# Patient Record
Sex: Female | Born: 1960 | Race: White | Hispanic: No | Marital: Married | Smoking: Former smoker
Health system: Southern US, Community
[De-identification: ages and names within clinical notes are randomized; demographics above are authoritative.]

## PROBLEM LIST (undated history)

## (undated) DIAGNOSIS — K589 Irritable bowel syndrome without diarrhea: Secondary | ICD-10-CM

## (undated) DIAGNOSIS — K219 Gastro-esophageal reflux disease without esophagitis: Secondary | ICD-10-CM

## (undated) DIAGNOSIS — R0602 Shortness of breath: Secondary | ICD-10-CM

## (undated) DIAGNOSIS — J45909 Unspecified asthma, uncomplicated: Secondary | ICD-10-CM

## (undated) DIAGNOSIS — IMO0001 Reserved for inherently not codable concepts without codable children: Secondary | ICD-10-CM

## (undated) DIAGNOSIS — M1990 Unspecified osteoarthritis, unspecified site: Secondary | ICD-10-CM

## (undated) DIAGNOSIS — R7301 Impaired fasting glucose: Secondary | ICD-10-CM

## (undated) DIAGNOSIS — G4730 Sleep apnea, unspecified: Secondary | ICD-10-CM

## (undated) DIAGNOSIS — R7303 Prediabetes: Secondary | ICD-10-CM

## (undated) DIAGNOSIS — Q249 Congenital malformation of heart, unspecified: Secondary | ICD-10-CM

## (undated) DIAGNOSIS — T7840XA Allergy, unspecified, initial encounter: Secondary | ICD-10-CM

## (undated) DIAGNOSIS — E785 Hyperlipidemia, unspecified: Secondary | ICD-10-CM

## (undated) DIAGNOSIS — E039 Hypothyroidism, unspecified: Secondary | ICD-10-CM

## (undated) DIAGNOSIS — K222 Esophageal obstruction: Secondary | ICD-10-CM

## (undated) DIAGNOSIS — E669 Obesity, unspecified: Secondary | ICD-10-CM

## (undated) DIAGNOSIS — Z950 Presence of cardiac pacemaker: Secondary | ICD-10-CM

## (undated) DIAGNOSIS — G4733 Obstructive sleep apnea (adult) (pediatric): Secondary | ICD-10-CM

## (undated) DIAGNOSIS — G43909 Migraine, unspecified, not intractable, without status migrainosus: Secondary | ICD-10-CM

## (undated) DIAGNOSIS — F329 Major depressive disorder, single episode, unspecified: Secondary | ICD-10-CM

## (undated) DIAGNOSIS — J439 Emphysema, unspecified: Secondary | ICD-10-CM

## (undated) DIAGNOSIS — H269 Unspecified cataract: Secondary | ICD-10-CM

## (undated) DIAGNOSIS — F419 Anxiety disorder, unspecified: Secondary | ICD-10-CM

## (undated) DIAGNOSIS — R001 Bradycardia, unspecified: Secondary | ICD-10-CM

## (undated) DIAGNOSIS — F32A Depression, unspecified: Secondary | ICD-10-CM

## (undated) HISTORY — DX: Impaired fasting glucose: R73.01

## (undated) HISTORY — DX: Gastro-esophageal reflux disease without esophagitis: K21.9

## (undated) HISTORY — DX: Irritable bowel syndrome, unspecified: K58.9

## (undated) HISTORY — DX: Unspecified asthma, uncomplicated: J45.909

## (undated) HISTORY — DX: Prediabetes: R73.03

## (undated) HISTORY — DX: Emphysema, unspecified: J43.9

## (undated) HISTORY — PX: OTHER SURGICAL HISTORY: SHX169

## (undated) HISTORY — DX: Hypothyroidism, unspecified: E03.9

## (undated) HISTORY — DX: Sleep apnea, unspecified: G47.30

## (undated) HISTORY — PX: REPLACEMENT TOTAL KNEE: SUR1224

## (undated) HISTORY — DX: Migraine, unspecified, not intractable, without status migrainosus: G43.909

## (undated) HISTORY — DX: Obstructive sleep apnea (adult) (pediatric): G47.33

## (undated) HISTORY — PX: CHOLECYSTECTOMY: SHX55

## (undated) HISTORY — PX: PACEMAKER INSERTION: SHX728

## (undated) HISTORY — DX: Bradycardia, unspecified: R00.1

## (undated) HISTORY — DX: Allergy, unspecified, initial encounter: T78.40XA

## (undated) HISTORY — DX: Presence of cardiac pacemaker: Z95.0

## (undated) HISTORY — DX: Depression, unspecified: F32.A

## (undated) HISTORY — DX: Obesity, unspecified: E66.9

## (undated) HISTORY — PX: CATARACT EXTRACTION: SUR2

## (undated) HISTORY — DX: Reserved for inherently not codable concepts without codable children: IMO0001

## (undated) HISTORY — DX: Esophageal obstruction: K22.2

## (undated) HISTORY — DX: Congenital malformation of heart, unspecified: Q24.9

## (undated) HISTORY — DX: Hyperlipidemia, unspecified: E78.5

## (undated) HISTORY — DX: Unspecified osteoarthritis, unspecified site: M19.90

## (undated) HISTORY — DX: Anxiety disorder, unspecified: F41.9

## (undated) HISTORY — PX: VAGINAL HYSTERECTOMY: SUR661

## (undated) HISTORY — PX: BREAST CYST EXCISION: SHX579

## (undated) HISTORY — DX: Major depressive disorder, single episode, unspecified: F32.9

## (undated) HISTORY — PX: HERNIA REPAIR: SHX51

## (undated) HISTORY — DX: Unspecified cataract: H26.9

## (undated) HISTORY — PX: TUBAL LIGATION: SHX77

## (undated) HISTORY — PX: COLONOSCOPY: SHX174

## (undated) HISTORY — PX: FOOT SURGERY: SHX648

## (undated) HISTORY — DX: Shortness of breath: R06.02

## (undated) HISTORY — PX: ABDOMINAL HERNIA REPAIR: SHX539

---

## 2001-01-31 ENCOUNTER — Other Ambulatory Visit: Admission: RE | Admit: 2001-01-31 | Discharge: 2001-01-31 | Payer: Self-pay | Admitting: Obstetrics and Gynecology

## 2001-04-18 ENCOUNTER — Inpatient Hospital Stay (HOSPITAL_COMMUNITY): Admission: EM | Admit: 2001-04-18 | Discharge: 2001-04-19 | Payer: Self-pay

## 2001-05-03 ENCOUNTER — Ambulatory Visit (HOSPITAL_COMMUNITY): Admission: RE | Admit: 2001-05-03 | Discharge: 2001-05-04 | Payer: Self-pay | Admitting: General Surgery

## 2001-05-03 ENCOUNTER — Encounter (INDEPENDENT_AMBULATORY_CARE_PROVIDER_SITE_OTHER): Payer: Self-pay | Admitting: Specialist

## 2001-05-29 ENCOUNTER — Ambulatory Visit (HOSPITAL_COMMUNITY): Admission: RE | Admit: 2001-05-29 | Discharge: 2001-05-29 | Payer: Self-pay | Admitting: Gastroenterology

## 2001-05-29 ENCOUNTER — Encounter: Payer: Self-pay | Admitting: Gastroenterology

## 2003-06-28 ENCOUNTER — Encounter: Payer: Self-pay | Admitting: Emergency Medicine

## 2003-06-28 ENCOUNTER — Inpatient Hospital Stay (HOSPITAL_COMMUNITY): Admission: EM | Admit: 2003-06-28 | Discharge: 2003-06-29 | Payer: Self-pay | Admitting: Emergency Medicine

## 2003-07-11 ENCOUNTER — Ambulatory Visit (HOSPITAL_COMMUNITY): Admission: RE | Admit: 2003-07-11 | Discharge: 2003-07-11 | Payer: Self-pay | Admitting: Family Medicine

## 2003-08-22 ENCOUNTER — Ambulatory Visit (HOSPITAL_COMMUNITY): Admission: RE | Admit: 2003-08-22 | Discharge: 2003-08-22 | Payer: Self-pay | Admitting: Family Medicine

## 2003-08-22 ENCOUNTER — Encounter: Payer: Self-pay | Admitting: Family Medicine

## 2004-09-13 ENCOUNTER — Inpatient Hospital Stay (HOSPITAL_COMMUNITY): Admission: RE | Admit: 2004-09-13 | Discharge: 2004-09-17 | Payer: Self-pay | Admitting: Psychiatry

## 2004-09-14 ENCOUNTER — Ambulatory Visit: Payer: Self-pay | Admitting: Psychiatry

## 2005-01-04 ENCOUNTER — Ambulatory Visit (HOSPITAL_COMMUNITY): Admission: RE | Admit: 2005-01-04 | Discharge: 2005-01-04 | Payer: Self-pay | Admitting: Family Medicine

## 2005-05-10 ENCOUNTER — Ambulatory Visit: Payer: Self-pay | Admitting: Psychiatry

## 2005-05-10 ENCOUNTER — Inpatient Hospital Stay (HOSPITAL_COMMUNITY): Admission: AD | Admit: 2005-05-10 | Discharge: 2005-05-16 | Payer: Self-pay | Admitting: Psychiatry

## 2005-08-05 ENCOUNTER — Ambulatory Visit (HOSPITAL_COMMUNITY): Admission: RE | Admit: 2005-08-05 | Discharge: 2005-08-05 | Payer: Self-pay | Admitting: Family Medicine

## 2005-08-23 ENCOUNTER — Ambulatory Visit (HOSPITAL_COMMUNITY): Admission: RE | Admit: 2005-08-23 | Discharge: 2005-08-23 | Payer: Self-pay | Admitting: Family Medicine

## 2005-11-23 ENCOUNTER — Encounter: Payer: Self-pay | Admitting: Obstetrics & Gynecology

## 2005-11-23 ENCOUNTER — Inpatient Hospital Stay (HOSPITAL_COMMUNITY): Admission: RE | Admit: 2005-11-23 | Discharge: 2005-11-25 | Payer: Self-pay | Admitting: Obstetrics & Gynecology

## 2006-08-07 ENCOUNTER — Ambulatory Visit (HOSPITAL_COMMUNITY): Admission: RE | Admit: 2006-08-07 | Discharge: 2006-08-07 | Payer: Self-pay | Admitting: Family Medicine

## 2006-09-06 ENCOUNTER — Ambulatory Visit (HOSPITAL_COMMUNITY): Admission: RE | Admit: 2006-09-06 | Discharge: 2006-09-06 | Payer: Self-pay | Admitting: Family Medicine

## 2007-04-25 ENCOUNTER — Ambulatory Visit (HOSPITAL_COMMUNITY): Admission: RE | Admit: 2007-04-25 | Discharge: 2007-04-25 | Payer: Self-pay | Admitting: Family Medicine

## 2007-07-04 ENCOUNTER — Ambulatory Visit (HOSPITAL_COMMUNITY): Admission: RE | Admit: 2007-07-04 | Discharge: 2007-07-04 | Payer: Self-pay | Admitting: Family Medicine

## 2008-03-15 ENCOUNTER — Emergency Department (HOSPITAL_COMMUNITY): Admission: EM | Admit: 2008-03-15 | Discharge: 2008-03-16 | Payer: Self-pay | Admitting: Emergency Medicine

## 2009-03-19 ENCOUNTER — Ambulatory Visit (HOSPITAL_COMMUNITY): Admission: RE | Admit: 2009-03-19 | Discharge: 2009-03-19 | Payer: Self-pay | Admitting: Family Medicine

## 2010-03-24 ENCOUNTER — Encounter: Payer: Self-pay | Admitting: Orthopedic Surgery

## 2010-03-24 ENCOUNTER — Ambulatory Visit (HOSPITAL_COMMUNITY): Admission: RE | Admit: 2010-03-24 | Discharge: 2010-03-24 | Payer: Self-pay | Admitting: Family Medicine

## 2010-03-30 ENCOUNTER — Ambulatory Visit (HOSPITAL_COMMUNITY): Admission: RE | Admit: 2010-03-30 | Discharge: 2010-03-30 | Payer: Self-pay | Admitting: Family Medicine

## 2010-04-09 ENCOUNTER — Encounter: Payer: Self-pay | Admitting: Orthopedic Surgery

## 2010-04-12 ENCOUNTER — Ambulatory Visit: Payer: Self-pay | Admitting: Orthopedic Surgery

## 2010-04-12 DIAGNOSIS — M25819 Other specified joint disorders, unspecified shoulder: Secondary | ICD-10-CM | POA: Insufficient documentation

## 2010-04-12 DIAGNOSIS — M7580 Other shoulder lesions, unspecified shoulder: Secondary | ICD-10-CM

## 2010-04-26 ENCOUNTER — Encounter: Payer: Self-pay | Admitting: Orthopedic Surgery

## 2010-04-26 ENCOUNTER — Encounter: Admission: RE | Admit: 2010-04-26 | Discharge: 2010-07-25 | Payer: Self-pay | Admitting: Orthopedic Surgery

## 2010-09-14 ENCOUNTER — Encounter: Payer: Self-pay | Admitting: Adult Health

## 2010-09-14 ENCOUNTER — Ambulatory Visit: Payer: Self-pay | Admitting: Cardiology

## 2010-09-14 DIAGNOSIS — R55 Syncope and collapse: Secondary | ICD-10-CM | POA: Insufficient documentation

## 2010-09-14 DIAGNOSIS — G4733 Obstructive sleep apnea (adult) (pediatric): Secondary | ICD-10-CM | POA: Insufficient documentation

## 2010-09-14 DIAGNOSIS — F17200 Nicotine dependence, unspecified, uncomplicated: Secondary | ICD-10-CM | POA: Insufficient documentation

## 2010-09-22 ENCOUNTER — Ambulatory Visit: Payer: Self-pay | Admitting: Cardiology

## 2010-09-24 ENCOUNTER — Encounter: Payer: Self-pay | Admitting: Cardiology

## 2010-09-24 ENCOUNTER — Ambulatory Visit: Payer: Self-pay | Admitting: Cardiology

## 2010-09-24 ENCOUNTER — Ambulatory Visit (HOSPITAL_COMMUNITY): Admission: RE | Admit: 2010-09-24 | Discharge: 2010-09-24 | Payer: Self-pay | Admitting: Cardiology

## 2010-09-27 ENCOUNTER — Encounter: Payer: Self-pay | Admitting: Cardiology

## 2010-09-29 ENCOUNTER — Encounter (INDEPENDENT_AMBULATORY_CARE_PROVIDER_SITE_OTHER): Payer: Self-pay | Admitting: *Deleted

## 2010-10-26 ENCOUNTER — Encounter: Payer: Self-pay | Admitting: Cardiology

## 2010-10-28 ENCOUNTER — Ambulatory Visit: Payer: Self-pay | Admitting: Cardiology

## 2010-10-28 DIAGNOSIS — I452 Bifascicular block: Secondary | ICD-10-CM | POA: Insufficient documentation

## 2010-10-28 DIAGNOSIS — I442 Atrioventricular block, complete: Secondary | ICD-10-CM | POA: Insufficient documentation

## 2010-10-29 ENCOUNTER — Encounter: Payer: Self-pay | Admitting: Cardiology

## 2010-11-09 ENCOUNTER — Ambulatory Visit: Payer: Self-pay | Admitting: Cardiovascular Disease

## 2010-11-09 ENCOUNTER — Inpatient Hospital Stay (HOSPITAL_COMMUNITY): Admission: EM | Admit: 2010-11-09 | Discharge: 2010-11-11 | Payer: Self-pay | Admitting: Emergency Medicine

## 2010-11-17 ENCOUNTER — Encounter: Payer: Self-pay | Admitting: Internal Medicine

## 2010-11-26 ENCOUNTER — Ambulatory Visit: Payer: Self-pay | Admitting: Cardiology

## 2010-11-26 ENCOUNTER — Encounter: Payer: Self-pay | Admitting: Internal Medicine

## 2011-01-11 NOTE — Letter (Signed)
Summary: Appointment - Missed  [facility] [place]  [address], [place] [address]   Phone: [phone]  Fax: [phone]     September 29, 2010 MRN: [id number]   [name] [address] [place], [place]  [address]   Dear Ms. KRAWCZYK,  Our records indicate you missed your appointment on      09/29/10                with Dr.       .     MCDOWELL                               It is very important that we reach you to reschedule this appointment. We look forward to participating in your health care needs. Please contact us at the number listed above at your earliest convenience to reschedule this appointment.     Sincerely,    Neurosurgeon Team  Appended Document: Appointment - Missed appt scheduled for next week

## 2011-01-11 NOTE — Letter (Signed)
Summary: [place] Results Engineer, agricultural at [facility]  [address] [place] [address]   Phone: [phone]  Fax: [phone]      September 24, 2010 MRN: [id number]   [name] [address] [place], [place]  [address]   Dear [name],  Your test ordered by Selena Batten has been reviewed by your physician (or physician assistant) and was found to be normal or stable. Your physician (or physician assistant) felt no changes were needed at this time.  __X__ Echocardiogram  __X__ Cardiac Stress Test (Stress Echo)  ____ Lab Work  ____ Peripheral vascular study of arms, legs or neck  ____ CT scan or X-ray  ____ Lung or Breathing test  ____ Other:   Thank you.  Please continue on current medical treatment.  Nona Dell, MD, F.A.C.C

## 2011-01-11 NOTE — Procedures (Signed)
Summary: [facility]  [facility]   Imported By: Faythe Ghee 10/26/2010 09:18:19  _____________________________________________________________________  External Attachment:    Type:   Image     Comment:   External Document

## 2011-01-11 NOTE — Letter (Signed)
Summary: Stress Echocardiogram Information Sheet  [facility]  [address] [place] [address]   Phone: [phone]  Fax: [phone]      September 14, 2010 MRN: [id number] light prior to the test.   [name]  Doctor: Appointment Date: Appointment Time: Appointment Location: [facility]  Stress Echocardiogram Information Sheet    Instructions:   1. You may take your morning medications the morning of test.  2. Do not eat or drink after midnight the night prior to test.   3. Dress prepared to exercise.  4. DO NOT use ANY caffine or tobacco products 3 hours before appointment.  5. Report to the Short Stay Center on the1st floor.  6. Please bring all current prescription medications.  7. If you have any questions, please call [phone]
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
## 2011-01-11 NOTE — Miscellaneous (Signed)
Summary: PT initial summary  PT initial summary   Imported By: Jacklynn Ganong 05/06/2010 10:52:37  _____________________________________________________________________  External Attachment:    Type:   Image     Comment:   External Document

## 2011-01-11 NOTE — Procedures (Signed)
Summary: [facility]   Imported By: Faythe Ghee 10/29/2010 10:00:39  _____________________________________________________________________  External Attachment:    Type:   Image     Comment:   External Document

## 2011-01-11 NOTE — Letter (Signed)
Summary: [place] Results Engineer, agricultural at [facility]  [address], [place] [address]   Phone: [phone]  Fax: [phone]      September 27, 2010 MRN: [id number]   [name] [address] [place], [place]  [address]   Dear [name],  Your test ordered by Selena Batten has been reviewed by your physician (or physician assistant) and was found to be normal or stable. Your physician (or physician assistant) felt no changes were needed at this time.  __X__ Echocardiogram  ____ Cardiac Stress Test  ____ Lab Work  ____ Peripheral vascular study of arms, legs or neck  ____ CT scan or X-ray  ____ Lung or Breathing test  ____ Other:  Please continue on current medical treatment.  Thank you.   Nona Dell, MD, F.A.C.C

## 2011-01-11 NOTE — Miscellaneous (Signed)
Summary: Device preload  Clinical Lists Changes  Observations: Added new observation of PPM INDICATN: Syncope (11/17/2010 8:30) Added new observation of MAGNET RTE: BOL 100 ERI 85 (11/17/2010 8:30) Added new observation of PPMLEADSTAT2: active (11/17/2010 8:30) Added new observation of PPMLEADSER2: [id number] (11/17/2010 8:30) Added new observation of PPMLEADMOD2: 2088TC (11/17/2010 8:30) Added new observation of PPMLEADDOI2: 11/10/2010 (11/17/2010 8:30) Added new observation of PPMLEADLOC2: RV (11/17/2010 8:30) Added new observation of PPMLEADSTAT1: active (11/17/2010 8:30) Added new observation of PPMLEADSER1: [id number] (11/17/2010 8:30) Added new observation of PPMLEADMOD1: 2088TC (11/17/2010 8:30) Added new observation of PPMLEADDOI1: 11/10/2010 (11/17/2010 8:30) Added new observation of PPMLEADLOC1: RA (11/17/2010 8:30) Added new observation of PPM IMP MD: Lewayne Bunting, MD (11/17/2010 8:30) Added new observation of PPM DOI: 11/10/2010 (11/17/2010 8:30) Added new observation of PPM SERL#: [id number]  (11/17/2010 8:30) Added new observation of PPM MODL#: [id number]  (11/17/2010 7:84) Added new observation of PACEMAKERMFG: [employer] Jude  (11/17/2010 8:30) Added new observation of PACEMAKER MD: Lewayne Bunting, MD  (11/17/2010 8:30)      PPM Specifications Following MD:  Lewayne Bunting, MD     PPM Vendor:  [employer] Jude     PPM Model Number:  [id number]     PPM Serial Number:  [id number] PPM DOI:  11/10/2010     PPM Implanting MD:  Lewayne Bunting, MD  Lead 1    Location: RA     DOI: 11/10/2010     Model #: [id number]     Serial #: [id number]     Status: active Lead 2    Location: RV     DOI: 11/10/2010     Model #: [id number]     Serial #: [id number]     Status: active  Magnet Response Rate:  BOL 100 ERI 85  Indications:  Syncope

## 2011-01-11 NOTE — Miscellaneous (Signed)
Summary: physical therapy order  physical therapy order   Imported By: Cammie Sickle 07/01/2010 19:07:30  _____________________________________________________________________  External Attachment:    Type:   Image     Comment:   External Document

## 2011-01-11 NOTE — Assessment & Plan Note (Signed)
Summary: LT SHOULDER PAIN/?TEAR/MRI AP 03/24/10/NEED XR/REF WSLUKING/CI...   Vital Signs:  Patient profile:   50 year old female Height:      67 inches Weight:      186 pounds Pulse rate:   70 / minute Resp:     16 per minute  Vitals Entered By: Fuller Canada MD (Apr 12, 2010 11:14 AM)  Visit Type:  new patient Referring Provider:  Lubertha South Primary Provider:  Dr. Lubertha South  CC:  left shoulder pain.  History of Present Illness: 50 year old female presents with 6 months of pain in her LEFT shoulder with no history of injury  Pain is sharp and dull, she rates at a 9/10, it is intermittent, if she moves her arm in the wrong way she gets a sharp pain.  It came on gradually.  She did see improvement if she does not use the arm it is worse with movement and use and is associated with catching, she denies numbness or stiffness.  She does not get relief with ibuprofen 400 mg. Xrays today in our office.  MRI [facility] for review from 03/24/10. the MRI shows a mildly displaced SLAP tear by report tray subdeltoid bursitis supraspinatus tendinitis and tendinopathy in the biceps tendon.  Meds: Simvastatin, Levothyroxine, Vitamin D, Nexium, Ibuprofen 400mg  no relief.    Allergies (verified): 1)  ! Codeine 2)  ! Sulfa 3)  ! Betadine  Past History:  Past Medical History: IBS htn thyroid reflux sleep apnea depression anxiety cholesterol  Past Surgical History: 2 c sections tubal ligation stomach hernia hysterectomy gallbladder screws in rt foot  Family History: FH of Cancer:  Family History of Diabetes Family History Coronary Heart Disease female < 12 Family History of Arthritis Hx, family, asthma  Social History: Patient is married.  unemployed smokes 1 ppd cigs occasional 10 cans of soda per day  Review of Systems Constitutional:  Complains of weight gain and fatigue; denies weight loss, fever, and chills. Cardiovascular:  Complains of fainting and murmurs;  denies chest pain and palpitations. Respiratory:  Complains of snoring; denies short of breath, wheezing, couch, tightness, pain on inspiration, and snoring . Gastrointestinal:  Complains of heartburn, diarrhea, and constipation; denies nausea, vomiting, and blood in your stools. Genitourinary:  Denies frequency, urgency, difficulty urinating, painful urination, flank pain, and bleeding in urine. Neurologic:  Denies numbness, tingling, unsteady gait, dizziness, tremors, and seizure. Musculoskeletal:  Denies joint pain, swelling, instability, stiffness, redness, heat, and muscle pain. Endocrine:  Denies excessive thirst, exessive urination, and heat or cold intolerance. Psychiatric:  Complains of nervousness, depression, and anxiety; denies hallucinations. Skin:  Denies changes in the skin, poor healing, rash, itching, and redness. HEENT:  Denies blurred or double vision, eye pain, redness, and watering. Immunology:  Complains of seasonal allergies; denies sinus problems and allergic to bee stings. Hemoatologic:  Denies easy bleeding and brusing.  Physical Exam  Additional Exam:   * VS reviewed and were normal   *GEN: appearance was normal   ** CDV: normal pulses temperature and no edema  * LYMPH nodes were normal   * SKIN was normal   * Neuro: normal sensation ** Psyche: AAO x 3 and mood was normal   MSK *Gait was normal   The LEFT shoulder is tender over the anterolateral acromion, there is no swelling, the shoulder is stable, the SubScap is 5/5, the EXT/ROT are 5/5, the SSpinatus is 4/5. The impingement sign is positive. ROM: EXT/ROT=  45  INT/ROT=  level lumbar 3           FLEXION=   150    there is no instability in the LEFT shoulder    the RIGHT shoulder seemed to have full range of motion no tenderness or swelling normal strength in the internal and external rotators and no impingement signs and appears to be stable      Impression & Recommendations:  Problem #  1:  IMPINGEMENT SYNDROME (ICD-726.2) Assessment New  plain films were also obtained and they were negative.  I think this SLAP tear diagnosis is questionable.  He seems to be more impingement.  There is some tendinopathy that I do see and the long head of the biceps but nonoperative treatment is probably the best option at this point.  Orders: New Patient Level III ([address]) Depo- Medrol 40mg  (J1030) Joint Aspirate / Injection, Large (20610) Shoulder x-ray,  minimum 2 views (60454)  Problem # 2:     Patient Instructions: 1)  PT 2)  You have received an injection of cortisone today. You may experience increased pain at the injection site. Apply ice pack to the area for 20 minutes every 2 hours and take 2 xtra strength tylenol every 8 hours. This increased pain will usually resolve in 24 hours. The injection will take effect in 3-10 days.  3)  Please schedule a follow-up appointment in 2 months. 4)  Try tylenol 1000 mg by mouth q 6 hrs as needed for pain

## 2011-01-11 NOTE — Assessment & Plan Note (Signed)
Summary: rov/rm   Visit Type:  Follow-up Referring Provider:  Lubertha South Primary Provider:  Dr. Simone Curia  CC:  no cardiology complaints.  History of Present Illness: Mrs. Acoff returns today for discussion of cardionet monitor results in the setting of weakness, near syncope, and palpatations.She has a history of hypothyroidism with normal TSH on last check at 4.2, hypercholesterolemia with last eval in Sept of 2011.  She has had some repeated episodes of profound fatigue, requiring her to lie down, and some dizziness at times, no further episodes of symcope. No chest pain.  Current Medications (verified): 1)  Vitamin D3 1000 Unit Tabs (Cholecalciferol) .... Take 1 Tab Daily 2)  Prilosec 20 Mg Cpdr (Omeprazole) .... Take 1 Tab Daily 3)  Levothroid 50 Mcg Tabs (Levothyroxine Sodium) .... Take 1 Tab Daily 4)  Pravastatin Sodium 40 Mg Tabs (Pravastatin Sodium) .... Take 1 Tab Daily 5)  Vitamin B-12 1000 Mcg Tabs (Cyanocobalamin) .... Take 1 Tab Daily  Allergies (verified): 1)  ! Codeine 2)  ! Sulfa 3)  ! Betadine  Comments:  Nurse/Medical Assistant: patient reviewed med list from previous ov and stated all meds are the same  Past History:  Past medical, surgical, family and social histories (including risk factors) reviewed, and no changes noted (except as noted below).  Past Medical History: Reviewed history from 09/14/2010 and no changes required. IBS Anxiety Depression - bipolar disorder G E R D Hyperlipidemia Hypertension Hypothyroidism OSA  Past Surgical History: Reviewed history from 09/14/2010 and no changes required. Cesarean sections x 2 Tubal ligation Hernia repair Hysterectomy Cholecystectomy Screws in right foot  Family History: Reviewed history from 09/14/2010 and no changes required. FH of Cancer Family History of Diabetes Family History Coronary Heart Disease female < 81 Family History of Arthritis Hx, family, asthma  Social  History: Reviewed history from 09/14/2010 and no changes required. Unemployed 10 cans of soda per day Alcohol Use - yes (occasional) Tobacco Use - Yes (one PPD) Married   Review of Systems       Profound weakness and near syncope.  Vital Signs:  Patient profile:   50 year old female Weight:      175 pounds BMI:     27.51 Pulse rate:   73 / minute BP sitting:   117 / 79  (right arm)  Vitals Entered By: Dreama Saa, CNA (October 28, 2010 1:30 PM)  Physical Exam  General:  Well developed, well nourished, in no acute distress. Lungs:  Clear bilaterally to auscultation and percussion. Heart:  Non-displaced PMI, chest non-tender; regular rate and rhythm, S1, S2 without murmurs, rubs or gallops. Carotid upstroke normal, no bruit. Normal abdominal aortic size, no bruits. Femorals normal pulses, no bruits. Pedals normal pulses. No edema, no varicosities. Msk:  Back normal, normal gait. Muscle strength and tone normal. Pulses:  pulses normal in all 4 extremities Extremities:  No clubbing or cyanosis. Neurologic:  Alert and oriented x 3. Psych:  Normal affect.   Impression & Recommendations:  Problem # 1:  AV BLOCK, COMPLETE (ICD-426.0) Review of cardionet completed from 09/22/2010 to 10/12/2010 and read by Dr. Diona Browner demonstrates NSR with HR variablility.  Single pause of 3.5 seconds on 10/06/2010, AV Block. No medications are taken that are affecting her HR.  Recent TSH was WNL.  She is symptomatic with profound fatigue causing her to lie down.  Previous to wearing monitor she was having near syncope and syncope.  I have advised patient that she will be referred to  EP in [place] for evaluation for PMK, vs other testing per them.  Copy of cardionet is provided to patient and will be sent to EP referral.  Problem # 2:  FAMILY HISTORY CORONARY HEART DISEASE FEMALE < 55 (ICD-V17.3) She has had recent stress test and echo on Sep 24, 2010 which demonstrated normal LVEF at 55%-60% and  negative ischemia or wall motion abnormalities at stress.  Low risk for CAD at present.  Problem # 3:  TOBACCO ABUSE (ICD-305.1) Cessation is recommended.  Patient Instructions: 1)  You have been referred to ElectroPhysiology Doctor

## 2011-01-13 NOTE — Cardiovascular Report (Signed)
Summary: Office Visit   Office Visit   Imported By: Roderic Ovens 12/10/2010 16:20:20  _____________________________________________________________________  External Attachment:    Type:   Image     Comment:   External Document

## 2011-01-13 NOTE — Procedures (Signed)
Summary: wound check.sjm.amber   Current Medications (verified): 1)  Vitamin D3 1000 Unit Tabs (Cholecalciferol) .... Take 1 Tab Daily 2)  Prilosec 20 Mg Cpdr (Omeprazole) .... Take 1 Tab Daily 3)  Levothroid 50 Mcg Tabs (Levothyroxine Sodium) .... Take 1 Tab Daily 4)  Pravastatin Sodium 40 Mg Tabs (Pravastatin Sodium) .... Take 1 Tab Daily 5)  Vitamin B-12 1000 Mcg Tabs (Cyanocobalamin) .... Take 1 Tab Daily  Allergies (verified): 1)  ! Codeine 2)  ! Sulfa 3)  ! Betadine  PPM Specifications Following MD:  Lewayne Bunting, MD     PPM Vendor:  [employer] Jude     PPM Model Number:  [id number]     PPM Serial Number:  [id number] PPM DOI:  11/10/2010     PPM Implanting MD:  Lewayne Bunting, MD  Lead 1    Location: RA     DOI: 11/10/2010     Model #: [id number]     Serial #: [id number]     Status: active Lead 2    Location: RV     DOI: 11/10/2010     Model #: [id number]     Serial #: [id number]     Status: active  Magnet Response Rate:  BOL 100 ERI 85  Indications:  Syncope CHB   PPM Follow Up Remote Check?  No Battery Voltage:  3.20 V     Battery Est. Longevity:  12 years     Pacer Dependent:  No       PPM Device Measurements Atrium  Amplitude: 1.9 mV, Impedance: 410 ohms, Threshold: 0.875 V at 0.4 msec Right Ventricle  Amplitude: 7.9 mV, Impedance: 540 ohms, Threshold: 0.75 V at 0.4 msec  Episodes MS Episodes:  0     Percent Mode Switch:  0     Coumadin:  No Atrial Pacing:  <1%     Ventricular Pacing:  <1%  Parameters Mode:  DDD     Lower Rate Limit:  50     Upper Rate Limit:  100 Paced AV Delay:  350     Sensed AV Delay:  325 Next Cardiology Appt Due:  02/14/2011 Tech Comments:  Steri strips removed, no redness or edema.  Ms. Whittier is c/o a fair amount of continued soreness @ the site.  She does have a history of shoulder problems and again the site looks fine.  She has limited movement in that shoulder due to previous problems.  She was instructed to call the office if the pain becomes worse or if the  site becomes red, swollen or hot to touch or if she developes chills or a fever, otherwise Dr. Ladona Ridgel will see her 02/14/11 in [facility].  No parameter changes.  Device function normal. Altha Harm, LPN  November 26, 2010 9:12 AM  .sign

## 2011-01-14 NOTE — Letter (Signed)
Summary: History form  History form   Imported By: Jacklynn Ganong 04/21/2010 15:44:20  _____________________________________________________________________  External Attachment:    Type:   Image     Comment:   External Document

## 2011-02-14 ENCOUNTER — Encounter: Payer: Self-pay | Admitting: Internal Medicine

## 2011-02-14 ENCOUNTER — Encounter (INDEPENDENT_AMBULATORY_CARE_PROVIDER_SITE_OTHER): Payer: Self-pay | Admitting: *Deleted

## 2011-02-14 ENCOUNTER — Encounter (INDEPENDENT_AMBULATORY_CARE_PROVIDER_SITE_OTHER): Payer: Commercial Indemnity | Admitting: Internal Medicine

## 2011-02-14 DIAGNOSIS — Z950 Presence of cardiac pacemaker: Secondary | ICD-10-CM | POA: Insufficient documentation

## 2011-02-14 DIAGNOSIS — I495 Sick sinus syndrome: Secondary | ICD-10-CM

## 2011-02-14 DIAGNOSIS — I10 Essential (primary) hypertension: Secondary | ICD-10-CM

## 2011-02-21 ENCOUNTER — Encounter: Payer: Self-pay | Admitting: Internal Medicine

## 2011-02-22 LAB — POCT I-STAT, CHEM 8
BUN: 4 mg/dL — ABNORMAL LOW (ref 6–23)
Chloride: 107 mEq/L (ref 96–112)
Creatinine, Ser: 0.9 mg/dL (ref 0.4–1.2)
Potassium: 3.6 mEq/L (ref 3.5–5.1)

## 2011-02-22 LAB — APTT: aPTT: 30 seconds (ref 24–37)

## 2011-02-22 LAB — TSH: TSH: 1.271 u[IU]/mL (ref 0.350–4.500)

## 2011-02-22 LAB — PROTIME-INR: Prothrombin Time: 13.6 seconds (ref 11.6–15.2)

## 2011-02-22 NOTE — Assessment & Plan Note (Signed)
Summary: pacer check.sjm.amber/hm   Visit Type:  Follow-up Referring Provider:  Lubertha South Primary Provider:  Dr. Simone Curia  CC:  no cardiology complaints.  History of Present Illness: Mrs. Saylor returns today for followup. She is a pleasant middle aged woman with a h/o syncope and documented bradycardia who underwent PPM several months ago. She has had no recurrent syncopal spells and feels well. Her only complaint today is that she would like to stop smoking. No c/p, sob or peripheral edema. No pain around her PPM site.  Current Medications (verified): 1)  Vitamin D3 1000 Unit Tabs (Cholecalciferol) .... Take 1 Tab Daily 2)  Prilosec 20 Mg Cpdr (Omeprazole) .... Take 1 Tab Daily 3)  Levothroid 50 Mcg Tabs (Levothyroxine Sodium) .... Take 1 Tab Daily 4)  Pravastatin Sodium 40 Mg Tabs (Pravastatin Sodium) .... Take 1 Tab Daily 5)  Vitamin B-12 1000 Mcg Tabs (Cyanocobalamin) .... Take 1 Tab Daily  Allergies (verified): 1)  ! Codeine 2)  ! Sulfa 3)  ! Betadine  Comments:  Nurse/Medical Assistant: no meds no list we reviewed from previous ov stated all meds are correct  Past History:  Past Medical History: Last updated: 09/14/2010 IBS Anxiety Depression - bipolar disorder G E R D Hyperlipidemia Hypertension Hypothyroidism OSA  Past Surgical History: Last updated: 09/14/2010 Cesarean sections x 2 Tubal ligation Hernia repair Hysterectomy Cholecystectomy Screws in right foot  Review of Systems  The patient denies chest pain, syncope, dyspnea on exertion, and peripheral edema.    Vital Signs:  Patient profile:   50 year old female Weight:      174 pounds BMI:     27.35 Pulse rate:   56 / minute BP sitting:   125 / 81  (left arm)  Vitals Entered By: Dreama Saa, CNA (February 14, 2011 8:30 AM)  Physical Exam  General:  Well developed, well nourished, in no acute distress. Head:  normocephalic and atraumatic Eyes:  PERRLA/EOM intact; conjunctiva and  lids normal. Neck:  Neck supple, no JVD. No masses, thyromegaly or abnormal cervical nodes. Chest Wall:  Well healed PPM incision. Lungs:  Clear bilaterally to auscultation with no wheezes, rales,or rhonchi. Heart:  RRR with normal S1 and S2. PMI is not enlarged. Abdomen:  Bowel sounds positive; abdomen soft and non-tender without masses, organomegaly, or hernias noted. No hepatosplenomegaly. Msk:  Back normal, normal gait. Muscle strength and tone normal. Pulses:  pulses normal in all 4 extremities Extremities:  No clubbing or cyanosis. Neurologic:  Alert and oriented x 3.   PPM Specifications Following MD:  Lewayne Bunting, MD     PPM Vendor:  [employer]     PPM Model Number:  [id number]     PPM Serial Number:  [id number] PPM DOI:  11/10/2010     PPM Implanting MD:  Lewayne Bunting, MD  Lead 1    Location: RA     DOI: 11/10/2010     Model #: [id number]     Serial #: [id number]     Status: active Lead 2    Location: RV     DOI: 11/10/2010     Model #: [id number]     Serial #: [id number]     Status: active  Magnet Response Rate:  BOL 100 ERI 85  Indications:  Syncope CHB   PPM Follow Up Pacer Dependent:  No      Episodes Coumadin:  No  Parameters Mode:  DDD     Lower Rate Limit:  50  Upper Rate Limit:  100 Paced AV Delay:  350     Sensed AV Delay:  325  Impression & Recommendations:  Problem # 1:  CARDIAC PACEMAKER IN SITU (ICD-V45.01) Her device is working normally. Will check in several months.  Problem # 2:  TOBACCO ABUSE (ICD-305.1) I have discussed the importance of smoking cessation. She will try to stop.  Patient Instructions: 1)  Your physician recommends that you schedule a follow-up appointment in: November, 2012

## 2011-02-22 NOTE — Letter (Signed)
Summary: Clearance Letter  [facility]  [address], [place] [address]   Phone: [phone]  Fax: [phone]    February 14, 2011  Re:     [name] Address:   [address]     [place], [place]  [address] DOB:     [birth year] MRN:     [id number]   To Whom It May Concern:   [name] had syncope secondary to bradycardia.  She required a   pacemaker insertion for this  condition.  Syncope has since resolved due   to pacemaker placement.           Sincerely,  Dr. Lewayne Bunting, MD, [facility]

## 2011-04-29 NOTE — Discharge Summary (Signed)
[name], [name]                ACCOUNT NO.:  [id number]   MEDICAL RECORD NO.:  [id number]          PATIENT TYPE:  INP   LOCATION:  A418                          FACILITY:  [facility]   PHYSICIAN:  Lazaro Arms, M.D.   DATE OF BIRTH:  Dec 29, 1960   DATE OF ADMISSION:  11/23/2005  DATE OF DISCHARGE:  12/15/2006LH                                 DISCHARGE SUMMARY   DISCHARGE DIAGNOSES:  1.  Status post total abdominal hysterectomy, bilateral salpingo-      oophorectomy.  2.  Unremarkable postoperative course.   PROCEDURES:  Total abdominal hysterectomy, bilateral salpingo-oophorectomy.   HISTORY OF PRESENT ILLNESS:  Please refer to the History and Physical on the  Operative Report for details of admission to the hospital.   HOSPITAL COURSE:  The patient was admitted postoperatively from [facility]  because of enlarged fibroid uterus which was becoming increasingly  symptomatic, both with pain and with heavy bleeding.  Her postoperative  course was unremarkable.  She tolerated clear liquids and a regular diet.  She voided without symptoms and was extensively ambulatory.  Her incision  remained clean, dry and intact.  She was voiding well and had progression of  normal bowel function, and again, tolerated oral pain medications.  She was  discharged to home on the morning of postoperative day #2 in good, stable  condition.  Follow up the following week in the office to have her incision  examined and staples removed.  She was given Lorcet Plus for pain at the  time of discharge, and given instruction precautions for return to the  office prior to her scheduled visit if needed.      Lazaro Arms, M.D.  Electronically Signed     LHE/MEDQ  D:  12/28/2005  T:  12/28/2005  Job:  [id number]

## 2011-04-29 NOTE — Discharge Summary (Signed)
NAMEJENNAVIEVE, [name]                            ACCOUNT NO.:  [id number]   MEDICAL RECORD NO.:  [id number]                   PATIENT TYPE:  INP   LOCATION:  A201                                 FACILITY:  [facility]   PHYSICIAN:  Donna Bernard, M.D.             DATE OF BIRTH:  Nov 05, 1961   DATE OF ADMISSION:  06/28/2003  DATE OF DISCHARGE:  06/29/2003                                 DISCHARGE SUMMARY   FINAL DIAGNOSES:  1. Syncopal event.  2. Sinusitis.  3. History of depression/anxiety.  4. Insomnia.   DISPOSITION:  1. Patient discharged home.  2. Follow up with Dr. Donna Bernard on Tuesday as scheduled.  3. Report any further stones.   DISCHARGE MEDICATIONS:  1. Astelin 2 sprays each nostril twice per day.  2. Augmentin 875 b.i.d. for 10 days.  3. Maintain Lexapro 10 mg daily.  4. Add Xanax 0.5 q.h.s. p.r.n. for sleep.   INITIAL OBSERVATION NOTE:  See as dictated.   HOSPITAL COURSE:  This patient is a 50 year old white female with chronic  smoking history who presented to the emergency room on the day of admission  with syncopal event. She was at dinner, out to eat, sitting at the table  with her family when she experienced some tingling sensation around her  face.  This lasted for a few moments and then the patient put her hand over  her face, and apparently collapsed forward with her face falling down into  her plate of foot.  She was only out for a few seconds.  When she came back  around, she could recall no palpitations.  She recalled no tingling around  her hands or __________.  The patient gives no prior history of significant  syncopal event.  She has been on Lexapro, for some time, for anxiety and  depression.  The patient notes no increased anxiety recently. She also noted  that she had not been particularly stressed recently and this does not seem  to have anything to do with her unusual sensations.  These spells usually  occurs interestingly in the evening  time when she is eating.  The patient  has not had any significant reflux symptoms.   There is no family history of premature heart disease.   We went ahead and admitted her to the hospital.  She was monitored via  telemetry.  She did have a bit of bradycardia, but no other arrhythmias.  We  did cardiac enzymes x2, these were negative.  The patient's blood  pressure was more or less within normal limits.  She had no further spells.  The slight tightness in her chest resolved. A repeat EKG was stable. On the  morning of discharge the patient was feeling fine. She was eager to go home.  At this point I feel that she was stable for discharge.  We need to press on  with  further workup.                                               Donna Bernard, M.D.    [name]  D:  06/29/2003  T:  06/29/2003  Job:  [id number]

## 2011-04-29 NOTE — Procedures (Signed)
NAMEMALERIE, EAKINS                          ACCOUNT NO.:  [id number]   MEDICAL RECORD NO.:  [id number]                   PATIENT TYPE:  INP   LOCATION:  A201                                 FACILITY:  [facility]   PHYSICIAN:  Donna Bernard, M.D.             DATE OF BIRTH:  1961/03/01   DATE OF PROCEDURE:  06/29/2003  DATE OF DISCHARGE:  06/29/2003                                EKG INTERPRETATION   FINDINGS:  EKG reveals sinus bradycardia with nonspecific ST segment changes  consistent with early repolarization.      ___________________________________________                                            Donna Bernard, M.D.   WSL/MEDQ  D:  12/10/2003  T:  12/10/2003  Job:  [id number]

## 2011-04-29 NOTE — Procedures (Signed)
   [name]                          ACCOUNT NO.:  [id number]   MEDICAL RECORD NO.:  [id number]                   PATIENT TYPE:  OUT   LOCATION:  DFTL                                 FACILITY:  [facility]   PHYSICIAN:  Donna Bernard, M.D.             DATE OF BIRTH:  June 20, 1961   DATE OF PROCEDURE:  07/11/2003  DATE OF DISCHARGE:  07/11/2003                                    STRESS TEST   INDICATION FOR TEST:  Chest pain along with multiple risk factors.   Stress test was performed at standard Bruce protocol with resting EKG  revealing normal sinus rhythm, no significant ST-T changes.  The patient  tolerated the first two stages well.  During the third stage the patient  developed progressive shortness of breath.  She reached the sub max  predicted heart rate of 161 at a minute-and-a-half into the third stage.  At  this point, using a strict definition of assessing the ST segment at 0.08  seconds past the J point, the patient did have depression in leads II, III,  aVF, and in V5 and V6.  However, the ST segment was nicely ascending in  these areas.  Of note, the final EKG obtained before the stress test was  stopped had a significant amount of artifact and threw some of the computer  assessment off.  Visual reading of the EKG at the current heart rate of 156  at the moment of stopping the test showed that 0.08 seconds past the J point  there was no significant ST segment depression.  The depression was mostly 1  mm or less with a very sharply ascending slope.  The patient's ST segments  quickly returned to normal during the rest phase.   IMPRESSION:  Negative adequate stress test.   PLAN:  The patient encouraged to press on with follow-up in the office and  exercise.                                               Donna Bernard, M.D.    WSL/MEDQ  D:  08/27/2003  T:  08/27/2003  Job:  [id number]

## 2011-04-29 NOTE — H&P (Signed)
[place]. [facility]  Patient:    [name], [name]                         MRN: [id number] Adm. Date:  05/03/01 Attending:  Jimmye Norman, M.D. CC:         Loran Senters, M.D.             Dr. Manson Passey                         History and Physical  DATE OF BIRTH:  1961-01-11  IDENTIFICATION AND CHIEF COMPLAINT:  The patient is a 50 year old seen in the emergency room recently with choledocholithiasis and symptomatic gallstones.  HISTORY OF PRESENT ILLNESS:  She was seen in the emergency room for symptomatic gallstones which resolved by the time I was in to see her.  She had normal liver function tests and a normal white blood cell count. She was allowed to go home prior to scheduling surgery.  Ultrasound did not demonstrate any evidence of common duct obstruction.  There was perhaps by report some thickening of the wall but the report read diffuse gallbladder wall thickening. However, in hopes of calming down her cholecystitis she was placed on antibiotics and sent home.  PAST MEDICAL HISTORY:  Remarkable for acid reflux for which she takes Aciphex 20 mg twice a day.  PAST SURGICAL HISTORY:  She has had cesarean sections times two and a tubal ligation. Also a ventral hernia and cyst removed from her wrist.  FAMILY HISTORY:  Her father is alive and has throat cancer.  Mother is alive and brothers and sisters are alive and doing well.  SOCIAL HISTORY:  She is a 1-1/2 to 2 pack per day smoker.  She does not drink alcohol.  ALLERGIES:  CODEINE, BETADINE, and VIOXX.  PHYSICAL EXAMINATION:  GENERAL:  Well-nourished woman in no acute distress.  She is anicteric. Her symptoms have completely.  ABDOMEN:  Soft and nontender without any palpable masses.  CARDIAC:  Regular rhythm and rate with no murmurs, gallops, lifts or heaves. Her heart rate is slow in the 60s.  LUNGS:  Clear.  RECTAL/PELVIC:  Deferred.  LABORATORY DATA:  She has no abnormal liver  function tests.  Her white count is normal.  Hemoglobin 15.8, hematocrit 45.  Her liver function tests were all normal.  Only abnormality high platelet count of 435 and a glucose of 120.  IMPRESSION:  Symptomatic cholelithiasis and perhaps subacute cholecystitis in an otherwise healthy 50 year old woman.  PLAN:  To go ahead and do a laparoscopic cholecystectomy.  Intraoperative cholangiogram will not be necessary with the normal liver function tests unless clinically indicated at the time of surgery.  She will get preoperative antibiotics and PAS hose on call to the operating room. DD:  05/02/01 TD:  05/03/01 Job: [id number]

## 2011-04-29 NOTE — H&P (Signed)
[name], [name]                ACCOUNT NO.:  [id number]   MEDICAL RECORD NO.:  [id number]          PATIENT TYPE:  IPS   LOCATION:  0301                          FACILITY:  [facility]   PHYSICIAN:  Jeanice Lim, M.D. DATE OF BIRTH:  21-May-1961   DATE OF ADMISSION:  05/10/2005  DATE OF DISCHARGE:                         PSYCHIATRIC ADMISSION ASSESSMENT   IDENTIFYING INFORMATION:  A 50 year old married white female   INCOMPLETE DICTATION      JO/MEDQ  D:  05/13/2005  T:  05/13/2005  Job:  [id number]

## 2011-04-29 NOTE — Discharge Summary (Signed)
[place]. [facility]  Patient:    [name], [name]                         MRN: [id number] Adm. Date:  [id number] Disc. Date: [id number] Attending:  Arsenio Loader Dictator:   Pricilla Holm, M.D. CC:         Dr. Julieta Gutting, [place]  Jimmye Norman, M.D.   Discharge Summary  CONSULTATIONS:  Dr. Lindie Spruce, general surgery.  DISCHARGE DIAGNOSES: 1. Acute cholecystitis. 2. Esophageal spasm/stricture, rule out myocardial infarction.  DISCHARGE MEDICATIONS: 1. Aciphex one tablet p.o. q.d. 2. Maalox as needed. 3. Cipro 500 mg one tablet p.o. b.i.d. x 7 days. 4. Dilaudid 2 mg one tablet p.o. q.4-6h. p.r.n. pain.  PROCEDURE:  Right upper quadrant ultrasound which revealed gallbladder wall thickening, multiple stones and common bile duct mildly dilated at 6-7 mm consistent with acute cholecystitis.  HISTORY OF PRESENT ILLNESS:  [name] is a 50 year old, white female with past medical history of esophageal stricture who presents with pressure at her xiphoid which started about 10 p.m. the night before admission.  The pain was constant and there were no insighting factors or trauma.  Pain radiates below the xiphoid bilaterally at level of diaphragm.  She has positive nausea, vomiting and flushing.  Nothing made it better.  The patient did receive some relief with sublingual nitroglycerin in the emergency department.  She has never had anything like this before.  She denied any heart problems.  She believed this to be her esophagus.  She is reporting increased dysphagia with eating and drinking her esophagus was stretched two years ago.  She does have a history of GERD and has seen Dr. Arlyce Dice in the past.  PAST MEDICAL HISTORY: 1. GERD. 2. Esophageal stricture, status post stretching two years ago. 3. Contact dermatitis. 4. IBD. 5. Tobacco abuse.  PAST SURGICAL HISTORY: 1. C-section x 2. 2. Ventral hernia. 3. BTL.  MEDICATION:  Aciphex.  ALLERGIES:   VIOXX, BETADINE, CODEINE.  PHYSICAL EXAMINATION:  VITAL SIGNS:  The patient was afebrile on admission.  GENERAL:  In no acute distress.  LABORATORY DATA AND X-RAY FINDINGS:  EKG was normal, although bradycardic.  On admission, white blood cell count 11.6, hemoglobin 14.2, hematocrit 41.5, platelets 391, ANC 9.7.  CK-MB 1.1, CK 85, troponin less than 0.01.  Lipase 17.  PTT 31, PT 12.9, INR 1.0.  Sodium 136, potassium 3.4, chloride 106, bicarb 22, BUN 11, creatinine 0.8, glucose 122.  Total bilirubin 0.4, Alk phos 44.  SGOT 16, SGPT 14, total protein 6.5, albumin 3.7, calcium 9.3.  HOSPITAL COURSE:  #1 - ACUTE CHOLECYSTITIS:  The patient was found to have right upper quadrant pain the morning after admission.  She had tenderness to palpation with guarding in the right upper quadrant and an ultrasound was obtained with the above findings.  Dr. Lindie Spruce was consulted and he recommended treating her with Cipro for five to seven days and pain medicine.  He would see her as an outpatient for elective surgery.  #2 - CHEST PAIN:  The patient had completely negative cardiac enzymes and rule out for MI.  #3 - ESOPHAGEAL STRICTURE:  I suspect the patient does have a component of esophageal stricture versus spasms.  I took the liberty of making the patient an appointment to see her GI doctor, Dr. Arlyce Dice, later this month.  The patient was advised to quit smoking.  CONDITION ON DISCHARGE:  Good.  DISPOSITION:  Discharged  to home with husband.  DISCHARGE MEDICATIONS:  As above instructions.  ACTIVITY:  No restrictions.  DIET:  Low fat diet.  Avoid caffeine food secondary to her GERD.  The patient was given specific signs and symptoms to warrant further treatment.  FOLLOWUP:  The patient will follow up with Dr. Lindie Spruce on Tuesday, May 14, at 9 a.m. for free surgical consultation.  The patient will follow up with Dr. Arlyce Dice at GI on Friday, May 31, at 8:30 a.m.  The patient is to call Dr.  Cathlyn Parsons office in approximately four weeks to make a follow-up appointment with him for her general health care.  The patient and her husband voice agreement and understanding of the above discharge plan and had no further questions. DD:  04/19/01 TD:  04/22/01 Job: [id number]

## 2011-04-29 NOTE — Discharge Summary (Signed)
[name], [name]                ACCOUNT NO.:  [id number]   MEDICAL RECORD NO.:  [id number]          PATIENT TYPE:  IPS   LOCATION:  0504                          FACILITY:  [facility]   PHYSICIAN:  Geoffery Lyons, M.D.      DATE OF BIRTH:  06-18-1961   DATE OF ADMISSION:  09/13/2004  DATE OF DISCHARGE:  09/17/2004                                 DISCHARGE SUMMARY   CHIEF COMPLAINT AND PRESENT ILLNESS:  This was the first admission to [facility] for this 50 year old white female voluntarily  admitted.  History of depression and suicidal thoughts, plans to either use  a razor to harm herself or overdose or drive her car into a tree.  Had been  unable to cope.  Recent family death and her father currently died after one  year of illness.  She, however, would not actually hurt herself because her  mother has a history of suicide attempt and she would not hurt herself  because of her children.  Her sleep has been poor.  Appetite has been  satisfactory.  Denies any psychotic symptoms.  Compliant with her  medication.  She has not been able to see her father recently because her  father has stated that he is ready to go and she is unable to cope with  this.   PAST PSYCHIATRIC HISTORY:  First time at [facility].  No other  psychiatric treatment.  No history of suicide attempts.  History of seasonal  depression.   ALCOHOL/DRUG HISTORY:  Denies the use or abuse of any substances.   PAST MEDICAL HISTORY:  Noncontributory.   MEDICAL HISTORY:  Gastroesophageal reflux, irritable bowel syndrome, sleep  apnea.   MEDICATIONS:  Bentyl 10 mg four times a day as needed, levothyroxine 75 mcg  daily, Nexium 40 mg daily, Paxil CR 37.5 mg daily, Xanax 0.5 mg twice a day  as needed.   PHYSICAL EXAMINATION:  Performed and failed to show any acute findings.   LABORATORY DATA:  Glucose 131, BUN 2, SGOT 60, SGPT 83, hemoglobin 15.1.   MENTAL STATUS EXAM:  Alert, middle-aged female.   Cooperative.  Fair eye  contact.  Speech was clear.  Mood was depressed.  Affect was depressed.  Thought processes were logical, coherent and relevant.  Denies any suicidal  or homicidal ideation.  No delusions.  No hallucinations.  Cognition was  well-preserved.   ADMISSION DIAGNOSES:   AXIS I:  Major depression, recurrent.   AXIS II:  No diagnosis.   AXIS III:  1.  Gastroesophageal reflux.  2.  Hypothyroidism.  3.  Irritable bowel syndrome.  4.  Sleep apnea.   AXIS IV:  Moderate.   AXIS V:  Global Assessment of Functioning upon admission 35; highest Global  Assessment of Functioning in the last year 70.   HOSPITAL COURSE:  She was admitted and started in individual and group  psychotherapy.  She was given Xanax 0.5 mg four times a day as needed for  anxiety, Bentyl 10 mg four times a day as needed, Ambien 10 mg at bedtime  for sleep, levothyroxine 75 mcg daily, Nexium 40 mg daily, Lomotil 2.5 mg, 2  tabs four times a day, Paxil CR 37.5 mg daily, Phenergan 12.5 mg every six  hours as needed for nausea.  Paxil was increased to Paxil CR 50 mg per day.  Ambien was discontinued and she was placed on trazodone 50 mg, that was  eventually increased to 100 mg.  She was also placed on Wellbutrin XL 150 mg  in the morning.  She initially endorsed being more depressed with the  imminent death of her father, who is terminally ill.  Endorsed  schizoaffective disorder.  Feeling guilty because she was detaching from her  husband and her daughter, just immersed in her own pain, feeling  overwhelmed, a sense of hopelessness and helplessness.  Endorsed feeling  guilty.  Mood was depressed.  Affect was depressed and tearful.  We added  the Wellbutrin to augment the SSRI.  On October 6th, she was starting to  open up, dealing with the grief of the imminent loss of the father, trying  to get herself together, tried to focus on her daughter, son and husband.  Mood was depressed.  Affect was  depressed.  Continued to anticipate the  death of the father.  There was a family session with her son, her daughter  and her husband and daughter-in-law.  They were very supportive.  She was  able to address the grief and the loss issues surrounding the father's  illness.  She endorsed no suicidal ideation, endorsing that she would not  hurt herself, endorsed that the father's illness was a major factor, being  pressured by the husband to return to work, being responsible for family  bills.  They were able to look into ways to alleviate stress.  On October  7th, she was in full contact with reality.  Endorsed she was doing much  better.  No suicidal ideation.  No homicidal ideation.  No hallucinations.  No delusions.  Willing to pursue further outpatient treatment.  Felt that  the session with the family went well.  She was encouraged.  She was  motivated.  We went ahead and discharged to outpatient follow-up.   DISCHARGE DIAGNOSES:   AXIS I:  Major depression, single episode.   AXIS II:  No diagnosis.   AXIS III:  1.  Gastroesophageal reflux.  2.  Hypothyroidism.  3.  Irritable bowel syndrome.  4.  Sleep apnea.   AXIS IV:  Moderate.   AXIS V:  Global Assessment of Functioning upon discharge 55-60.   DISCHARGE MEDICATIONS:  1.  Nexium 40 mg per day.  2.  Paxil CR 25 mg, 2 daily.  3.  Wellbutrin XL 150 mg in the morning.  4.  Trazodone 100 mg at night as needed for sleep.  5.  Synthroid 50 mcg daily.  6.  Xanax 0.5 mg four times a day as needed for anxiety.   FOLLOW UP:  Dr. Dimple Casey and Dr. Wynonia Lawman.     Farrel Gordon   IL/MEDQ  D:  10/12/2004  T:  10/13/2004  Job:  [id number]

## 2011-04-29 NOTE — H&P (Signed)
[name], [name]                ACCOUNT NO.:  [id number]   MEDICAL RECORD NO.:  [id number]          PATIENT TYPE:  IPS   LOCATION:  0504                          [facility]ACILITY:  [facility]   PHYSICIAN:  Jeanice Lim, M.D. DATE OF BIRTH:  1961-11-24   DATE OF ADMISSION:  09/13/2004  DATE OF DISCHARGE:                         PSYCHIATRIC ADMISSION ASSESSMENT   IDENTIFYING INFORMATION:  50 year old, married, white female voluntarily  admitted on September 13, 2004.   HISTORY OF PRESENT ILLNESS:  The patient presents with a history of  depression and suicidal thoughts with plans to either use a razor to harm  herself, overdose or drive her car into a tree.  The patient has been unable  to cope with recent family death and her father is currently dying after one  year of illness.  The patient states that she, however, would not actually  hurt herself, because her mother has a history of a suicide attempt and she  would not hurt herself because of her children.  Her sleep has been poor.  Her appetite has been satisfactory.  She denies any psychotic symptoms.  The  patient has been compliant with her medication.  The patient states that she  has not been able to see her father recently because her father has stated  that he is ready to go and she is unable to cope with that and it has been  very upsetting to her.   PAST PSYCHIATRIC HISTORY:  First admission [facility].  No  other psychiatric admissions.  No history of a suicide attempt.  The patient  reports a history of seasonal depression.   SOCIAL HISTORY:  This is a 50 year old, married, white female.  Married for  seven years; second marriage.  Has two biological children; ages 83 and 80.  Two stepchildren; ages 40 and 66.  Is on short term disability right now,  working at [employer].  No legal problems.   FAMILY HISTORY:  Mother is bipolar with multiple suicide attempts.   ALCOHOL/DRUG HISTORY:  The patient smokes.   Denies any alcohol or drug use.   PRIMARY CARE Shirleymae Hauth:  Dr. Lubertha South in [place].   MEDICAL PROBLEMS:  1.  GERD.  2.  Irritable bowel syndrome.  3.  Sleep apnea.   MEDICATIONS:  Has been on:  1.  Bentyl 10 mg q.i.d. p.r.n.  2.  Levothyroxine 75 mg daily.  3.  Nexium 40 mg daily.  4.  Paxil CR 37.5 daily.  5.  Xanax 0.5 mg b.i.d.   The patient has been on Paxil for approximately six weeks.  Was on Lexapro  prior to that for about one year.  Felt it was ineffective.   DRUG ALLERGIES:  1.  CODEINE.  2.  VIOXX.  3.  BETADINE.   PHYSICAL EXAMINATION:  Patient's physical was done.  Her vital signs are  stable; 97.7; 64 heart rate; 20 respirations; blood pressure is 132/78; 5  feet 5 inches tall; 167 pounds.  The patient is a well-nourished, overweight  female.  Short, clean, gray hair.  Her trachea is midline.  Negative  lymphadenopathy.  Respirations are easy.  She has a soft, nontender,  somewhat mildly obese abdomen.  Extremities are strong, 5+ against  resistance, with no clubbing or edema.  Skin is warm and dry.  The patient's  neurological findings are intact.  Easily able to perform heel to chin.  Normal alternating movements and EOMs are intact.   REVIEW OF SYSTEMS:  Significant for smoking with no shortness of breath, no  chest pain, GERD, irritable bowel syndrome and sleep apnea using a CPAP  machine.   LABORATORY DATA:  Glucose is 131, BUN is 2, SGOT is elevated at 60, SGPT is  elevated at 83.  Hemoglobin is 15.1.   MENTAL STATUS EXAM:  Alert, middle aged, female.  Cooperative, fair eye  contact.  Speech is clear.  Mood is depressed.  The patient felt sad.  Her  thought processes are coherent.  No evidence of psychosis.  Cognitive  function is intact.  Memory is good.  Judgment and insight are good.   AXIS I:  Major depressive disorder, severe.   AXIS II:  Deferred.   AXIS III:  1.  Gastroesophageal reflux disease.  2.  Hypothyroidism.  3.  Irritable  bowel syndrome.  4.  Sleep apnea.   AXIS IV:  Support group with her father's illness.  Occupation, economic  problems.   AXIS V:  Current is 35, past year is 29.   PLAN:  Voluntary admission for suicidal ideation.  Contract for safety.  Stabilize mood thinking.  We will resume her medications.  We will, however,  increase her Paxil to maximum medication and relieve depressive symptoms.  We will change the patient's Ambien to trazodone for sleep.  We will have a  family session with husband for support and discharge planning.  The patient  made need a follow-up with grief counseling.   TENTATIVE LENGTH OF STAY:  Five to six days.     Jani   JO/MEDQ  D:  09/14/2004  T:  09/14/2004  Job:  [id number]

## 2011-04-29 NOTE — Op Note (Signed)
[place]  Patient:    [name], [name]                         MRN: [id number] Proc. Date: 05/03/01 Adm. Date:  [id number] Attending:  Cherylynn Ridges                           Operative Report  PREOPERATIVE DIAGNOSIS:  Subacute cholecystitis with cholelithiasis.  POSTOPERATIVE DIAGNOSIS:  Subacute cholecystitis with cholelithiasis.  Hydrops of the gallbladder.  PROCEDURE:  Laparoscopic cholecystectomy.  SURGEON:  Jimmye Norman, M.D.  ASSISTANT:  Abigail Miyamoto, M.D.  ANESTHESIA:  General endotracheal anesthesia.  ESTIMATED BLOOD LOSS:  Less than 20 cc.  COMPLICATIONS:  None.  CONDITION:  Stable.  INDICATIONS:  The patient is a 50 year old woman admitted recently with abdominal pain, thought to possibly be cardiac in origin, but was found to have a thickened gallbladder wall with large stones on ultrasound.  However, she was asymptomatic at the time the initial consultation was called in.  She was subsequently placed on antibiotics for the possibility of acute cholecystitis and seen as an outpatient and scheduled electively.  FINDINGS:  The patient had a thickened gallbladder wall with hydrops, clear bowel on opening the gallbladder.  There were some omental adhesions to the gallbladder, otherwise the anatomy appeared to be normal.  DESCRIPTION OF PROCEDURE:  The patient was taken to the operating room and placed on the table in the supine position.  After an adequate general anesthesia was administered, she was prepped and draped in the usual sterile fashion exposing the midline of the right upper quadrant of the abdomen.  Because the patient had had a previous midline cesarean section and ventral hernia repair, Hasson technique was used.  The supraumbilical curvilinear incision was made down to the midline fascia.  The fascia was then grasped with Kocher clamps and subsequently fashioned size between that.  We were able to dissect  down into a preperitoneal plane and then pop through a very thickened peritoneum into the peritoneal cavity.  Once we were inside the peritoneum, there appeared to be no adhesions.  We were able to use the Hasson cannula, passing it through the supraumbilical fascia and secured it in place with a U-stitch of old Vicryl.  Once this was secured in place, we insufflated with carbon dioxide gas up to a maximum intra-abdominal pressure of 15 mmHg. Laparoscope confirmed positioning of the cannula in the proper position. Subsequently, two 5 mm right upper quadrant cannuli were passed into place along with a subxiphoid 10/11 cannula.  With all cannuli in place, the patient was placed in reversed Trendelenburg position and left side was tilted down and the dissection begun.  The gallbladder needed to be decompressed prior to dissection.  We did so with the large needle, aspirated through a 5 mm cannula.  We aspirated out approximately 25 to 30 cc of clear bile.  Once this was done, we grasped the gallbladder through the lateralmost 5 mm cannula, retracted toward right upper quadrant, and dissected out the gallbladder from its bed.  We initially dissected out the infundibulum and the cystic duct, cystic artery, and the hepatoduodenal triangle and the triangle of Chalot.  Each were dissected free. The cystic duct was clipped proximal and distally with double clips as was the artery.  Once this was done, we transected it and subsequently dissected the gallbladder out of its bed  using an electrocautery.  During this dissection, we did enter into the gallbladder and had spillage of clear bile.  Once we had the bile removed, we were able to remove the gallbladder using an endocatch bag brought out through the supraumbilical fascia.  Once this was closed, the suture that had been used to secure the Hasson cannula was used to close the fascia.  We had irrigated out the gallbladder bed with approximately  1.5 liters of saline.  There was clear drainage and no evidence of bile leakage. Once this was done, we allowed all gas to fall out, through the cannuli.  We then closed the skin.  0.25% Marcaine was injected at all cannula sites.  We closed the skin using a running subcuticular stitch of 4-0 Vicryl and sterile dressings were applied.  All sponge, needle, and instrument counts were correct at the conclusion of the case. DD:  05/03/01 TD:  05/03/01 Job: [id number]

## 2011-04-29 NOTE — Op Note (Signed)
NAMEDEIRDRA, [name]                ACCOUNT NO.:  [id number]   MEDICAL RECORD NO.:  [id number]          PATIENT TYPE:  INP   LOCATION:  A418                          FACILITY:  [facility]   PHYSICIAN:  Lazaro Arms, M.D.   DATE OF BIRTH:  12-Aug-1961   DATE OF PROCEDURE:  11/23/2005  DATE OF DISCHARGE:                                 OPERATIVE REPORT   PREOPERATIVE DIAGNOSES:  1.  Enlarged fibroid uterus.  2.  Menometrorrhagia.  3.  Dysmenorrhea.   POSTOPERATIVE DIAGNOSES:  1.  Enlarged fibroid uterus.  2.  Menometrorrhagia.  3.  Dysmenorrhea.   PROCEDURE:  Total abdominal hysterectomy/bilateral salpingo-oophorectomy.   SURGEON:  Dr. Despina Hidden.   ANESTHESIA:  General endotracheal.   FINDINGS:  The patient had an enlarged fibroid uterus. It appeared to be  maybe a solitary one in the fundus, probably about 6 or 7 cm in size. Both  ovaries had what appeared to be corpus luteum cysts. The rest of the  peritoneal cavity was normal.   DESCRIPTION OF OPERATION:  The patient was taken to the operating room,  placed in a supine position where she underwent general endotracheal  anesthesia. The vagina was prepped. Foley catheter was placed. The abdomen  was prepped and draped in usual sterile fashion. Pfannenstiel skin incision  was made and carried down sharply to the rectus fascia which was scored in  the midline and extended laterally. The fascia was taken off of the muscles  superiorly and inferiorly without difficulty. The muscles were divided. The  peritoneal cavity was entered. The upper abdomen was packed away, and hand  retractors were used to retract the muscles and peritoneum. The uterine  cornu were grasped. The left round ligament was suture ligated and cut. The  infundibulopelvic ligament was isolated, clamped, cut and double suture  ligated. The right round ligament was suture ligated and cut. The  infundibulopelvic ligament was isolated, clamped, cut and double suture  ligated. The vesicouterine serosal flap was created. The uterine vessels  were skeletonized bilaterally. The uterine vessels were clamped, cut and  suture ligated bilaterally. Serial pedicles were taken down the cervix to  the cardinal ligament, each pedicle being clamped, cut and suture ligated.  Vaginal angle was reached. The vaginal angles were cross clamped. The  specimen was removed. The vaginal angles were closed with interrupted figure-  of-eight sutures, and the vagina was closed with interrupted figure-of-eight  sutures. The pelvis was irrigated vigorously. Hemostasis was achieved.  Interceed was placed over the vaginal cuff to try to prevent postoperative  adhesions. The muscles were reapproximated loosely. The fascia closed  using 0 Vicryl running. Subcutaneous tissue was made hemostatic and  irrigated. The skin was closed using skin staples. The patient tolerated the  procedure well. She experienced 200 cc of blood loss and was taken to the  recovery room in good stable condition. All counts were correct.      Lazaro Arms, M.D.  Electronically Signed     LHE/MEDQ  D:  11/23/2005  T:  11/23/2005  Job:  [id number]

## 2011-04-29 NOTE — Discharge Summary (Signed)
[place]  Patient:    [name], [name]                         MRN: [id number] Adm. Date:  [id number] Disc. Date: [id number] Attending:  Cherylynn Ridges                           Discharge Summary  DISCHARGE DIAGNOSIS:  Acute cholecystitis with hydrops of the gallbladder and cholelithiasis.  PROCEDURES:  Laparoscopic cholecystectomy.  SURGEON:  Dr. Lindie Spruce.  DISCHARGE MEDICATIONS:  The patient did not want any narcotic pain medicine. She will take Tylenol only.  She was sent home on no antibiotics.  DIET:  As tolerated.  CONDITION ON DISCHARGE:  Stable.  ACTIVITY:  She should not return to work until after she is seen in clinic.  WOUND CARE:  She may shower and pat her wound dry.  HISTORY OF PRESENT ILLNESS:  The patient is a 50 year old female with abdominal pain and likely acute cholecystitis on recent ultrasound.  She had a thickened gallbladder wall noted on ultrasound, and this was confirmed at the time of surgery.  HOSPITAL COURSE:  She was admitted on the day of surgery, May 03, 2001, at which time she did undergo an uneventful laparoscopic cholecystectomy.  She did have hydrops of the gallbladder and multiple stones contained within.  The procedure went well, and she was discharged the following day after voiding well, ambulating well, and tolerating liquids well.DD:  05/04/01 TD:  05/05/01 Job: [id number]

## 2011-04-29 NOTE — H&P (Signed)
[name], [name]                ACCOUNT NO.:  [id number]   MEDICAL RECORD NO.:  [id number]          PATIENT TYPE:  IPS   LOCATION:  0301                          [facility]ACILITY:  [facility]   PHYSICIAN:  Jeanice Lim, M.D. DATE OF BIRTH:  [birth year]   DATE OF ADMISSION:  05/10/2005  DATE OF DISCHARGE:                         PSYCHIATRIC ADMISSION ASSESSMENT   IDENTIFYING INFORMATION:  A 50 year old married white female, voluntarily  admitted on May 10, 2005.   HISTORY OF PRESENT ILLNESS:  This patient had intentional overdose on  trazodone, taking 30-40 pills, 30-40 Klonopin and 30-40 Wellbutrin tablets.  The patient is a transfer from [facility] after being medically  stable.  The patient overdosed on Sunday night after her 48 year old left  home to stay with the patient's 17 year old son.  The patient's daughter has  been drinking and has been using drugs.  The patient states that she cannot  cope anymore with these situations.  She states that her daughter has no  more respect for her.  The patient reports that she otherwise has been  compliant with her medications.  She has had some difficulty sleeping, also  reports an increased weight gain, about 40 pounds.  Endorsing positive mood  swings, feels very irritable and states she still wishes she were dead.   PAST PSYCHIATRIC HISTORY:  Second admission.  The patient was here in  October 2005 for depression and suicidal thoughts, sees Dr. Wynonia Lawman as an  outpatient.   SOCIAL HISTORY:  She is a 50 year old married white female, married for 8  years, second marriage, has 2 children, 2 stepchildren, lives with her  husband and her 87 year old daughter.  She works at [employer].  Has a  12th grade education, no legal charges.   FAMILY HISTORY:  Mother who had a suicide attempt, states her mother is  bipolar.   ALCOHOL DRUG HISTORY:  The patient smokes.  The patient denies any alcohol  or drug use.   PAST MEDICAL HISTORY:   Primary care Cotina Freedman is Dr. Lubertha South in  [place].  Medical problems are noninsulin-dependent diabetes mellitus,  hypothyroidism, GERD and sleep apnea.   MEDICATIONS:  Klonopin, taking one daily, trazodone uncertain doses at  bedtime, Paxil CR 37.5 mg, metformin 500 mg daily, hydrochlorothiazide 25 mg  daily, Wellbutrin XL 450 mg daily, has been on that for the past 2 to 3  months, Nexium daily, and Synthroid 75 mcg daily.   DRUG ALLERGIES:  CODEINE.   PHYSICAL EXAMINATION:  The patient was assessed at [facility] where  it states the patient was verbally disruptive.  The patient is a well-  developed female in no acute distress.  Her vital signs are stable.  The  patient's TSH is 3.461.   MENTAL STATUS EXAM:  Alert, cooperative, good eye contact, casually dressed.  Speech is clear, normal pace and tone.  The patient feels very angry at her  children.  Affect is tearful.  Thought processes:  Endorsing suicide  ideation, although promises safety on the unit.  Cognitive function intact,  memory is good, judgment and insight is fair, poor  impulse control.   ADMISSION DIAGNOSES:   AXIS I:  Rule out bipolar disorder mixed.   AXIS II:  Deferred.   AXIS III:  Hypothyroidism, noninsulin-dependent diabetes mellitus,  gastroesophageal reflux disease and sleep apnea.   AXIS IV:  Problems with primary support group, medical problems, other  psychosocial problems.   AXIS V:  Current is 30, estimated this past year 40.   PLAN:  Admission for intentional overdose.  Contract for safety, stabilize  mood and thinking.  We will initiate Lamictal.  Risks and benefits were  discussed.  We will have Ambien available for sleep.  The patient is to  increase her coping skills, have a family session with husband and possibly  her daughter.  The patient is to follow up with Dr. Wynonia Lawman.  The patient is  to be medication compliant.   TENTATIVE LENGTH OF CARE:  5-7 days.      JO/MEDQ   D:  05/13/2005  T:  05/13/2005  Job:  [id number]

## 2011-04-29 NOTE — Discharge Summary (Signed)
[name], [name]                ACCOUNT NO.:  [id number]   MEDICAL RECORD NO.:  [id number]          PATIENT TYPE:  IPS   LOCATION:  0301                          FACILITY:  [facility]   PHYSICIAN:  Jeanice Lim, M.D. DATE OF BIRTH:  August 15, 1961   DATE OF ADMISSION:  05/10/2005  DATE OF DISCHARGE:  05/16/2005                                 DISCHARGE SUMMARY   IDENTIFYING DATA:  This 50 year old married Caucasian female voluntarily  admitted after intentional overdose on trazodone, taking 30-40 pills, 30-40  Klonopin, 30-40 Wellbutrin and transferred from [facility] after  being medically stabilized.  Patient had overdose on Sunday night after 2-  year-old left home to stay with patient's 69 year old son.  Patient's  daughter had been drinking and had been using drugs.  Patient states that  she cannot cope anymore with these situations.  She states that her daughter  has no more respect for her, otherwise, she reports having been compliant  with medications.  She also reports a weight gain of 40 pounds and mood  swings, irritable, wishing that she was dead as per patient.   MEDICATIONS:  Klonopin.  Trazodone.  Paxil CR.  Metformin.  Hydrochlorothiazide.  Wellbutrin.  Nexium.  Synthroid.   DRUG ALLERGIES:  CODEINE.   PHYSICAL EXAMINATION:  Physical examination and neurologic exam within  normal limits performed at [facility].   MENTAL STATUS EXAM:  Alert, oriented, cooperative.  Fair eye contact.  Speech within normal limits.  Angry at her children.  Tearful.  Thought  process endorsed suicidal ideations, promises safety while in the unit.  Cognitively intact.  Judgment and insight are fair with poor impulse  control.   ADMISSION DIAGNOSES:   AXIS I:  Bipolar disorder, mixed.   AXIS II:  Deferred.   AXIS III:  1.  Hypothyroidism.  2.  Non-insulin-dependent diabetes mellitus.  3.  Gastroesophageal reflux disease.  4.  Sleep apnea.   AXIS IV:   Moderate-to-severe problems with primary support group, medical  problems, psychosocial problems and transitional life phase, family  relationship conflict.   AXIS V:  30/60.   HOSPITAL COURSE:  Patient was admitted, ordered routine p.r.n. medications,  underwent further monitoring, was encouraged to participate in individual,  group and milieu therapy.  Risk/benefit ratio and alternative treatments  including rash were discussed with patient regarding Lamictal and other  medications.  The patient was started on medications to stabilize mood,  restore sleep cycle and decrease suicidal ideation.  Patient was to follow  up with Dr. Wynonia Lawman and participate in groups as tolerated.  Placed on safety  checks and medical monitoring.  Patient was stabilized on Depakote and  Klonopin and was discharged in improved condition with no dangerous  ideation, no psychotic symptoms.  She was able to be taken off close  observation for an adequate time period showing no dangerous behavior, no  suicidal or homicidal ideation, improvement in mood, improved coping skills,  judgment and insight.   DISCHARGE MEDICATIONS:  Patient was discharged after medication education on  1.  Metformin 500 mg daily.  2.  Protonix 40 mg daily.  3.  Synthroid 75 mcg daily.  4.  Depakote ER 500 mg at 8:00 p.m.  5.  Klonopin 1 mg at 9:00 a.m., 2:00 p.m. and 8:00 p.m.  6.  Hydrochlorothiazide 25 mg daily.  7.  Paxil CR 37.5 mg q.a.m.  8.  Wellbutrin XL 300 mg q.a.m.   FOLLOW UP:  Patient was discharged with followup in placed to Dr. Wynonia Lawman on  Wednesday, May 18, 2005, at 1:15.   DISCHARGE DIAGNOSES:   AXIS I:  Bipolar disorder, mixed.   AXIS II:  Deferred.   AXIS III:  1.  Hypothyroidism.  2.  Non-insulin-dependent diabetes mellitus.  3.  Gastroesophageal reflux disease.  4.  Sleep apnea.   AXIS IV:  Moderate-to-severe problems with primary support group, medical  problems, psychosocial problems and transitional  life phase, family  relationship conflict.   AXIS V:  Global assessment of function 55-60.       JEM/MEDQ  D:  06/19/2005  T:  06/19/2005  Job:  [id number]

## 2011-04-29 NOTE — Discharge Summary (Signed)
[name], [name]                            ACCOUNT NO.:  [id number]   MEDICAL RECORD NO.:  [id number]                   PATIENT TYPE:  INP   LOCATION:  A201                                 FACILITY:  [facility]   PHYSICIAN:  Donna Bernard, M.D.             DATE OF BIRTH:  10-17-1961   DATE OF ADMISSION:  06/28/2003  DATE OF DISCHARGE:  06/29/2003                                 DISCHARGE SUMMARY   OBSERVATION NOTE--June 28, 2003   CHIEF COMPLAINT:  Passed out.   SUBJECTIVE:  This patient is a 50 year old white female with a fairly benign  prior medical history who presents to the hospital on the day of admission  with complaint of having passed out. She was having supper with the family.  They were having a conversation, not stressful, with no significant anxiety.  The patient developed some tingling in her face.  This lasted for a few  moments and the next thing they knew the patient's head fell forward towards  her plate of food, and actually struck her food.  She was out for, at most,  a few seconds. She recalls no palpitations.  After she awoke she had a  little side pressure in her chest.  Of note, she has had a number of these  tingling sensation about the face; usually when eating supper.  The patient  notes not being stressed or anxious.  There is never any palpitations. She  has never had actual passing out before.  No prior history of syncopal  event. No prior history of seizures.  No family history of premature  coronary artery disease.   The patient notes recent congestion, stuffiness, greenish nasal discharge,  and a lot of drainage.   CURRENT MEDICATIONS:  Lexapro 10 mg daily.   PRIOR SURGERIES:  1. Cesarean section x2.  2. Tubal ligation.  3. Umbilical hernia repair.   PAST MEDICAL HISTORY:  Significant for recurrent sinusitis.   ALLERGIES:  CODEINE and BETADINE.   SOCIAL HISTORY:  The patient is married, has 2 children, works in Radi[employer], therapeutic.  One  child lives at home.  The patient smokes 1 to 1-1/2 packs per  day.   REVIEW OF SYSTEMS:  No exertional chest pain, no excessive dyspnea, no  swelling of the ankles.  Chest discomfort seems to occur more in the  evening, though has not had classic heartburn-type symptoms.  ROS otherwise  negative.   PHYSICAL EXAMINATION:  VITAL SIGNS:  Blood pressure 120/70, pulse 70.  GENERAL:  The patient is alert in no acute distress.  HEENT:  Nasal congestion.  No obvious maxillary tenderness.  Fundi: Disks  sharp. Pharynx: Moist.  NECK:  Supple.  No lymphadenopathy.  No bruits.  Posterior neck:  Small  nodule noted in midneck to deep palpation.  The patient has a bit of  discomfort with this.  LUNGS:  Clear.  HEART:  Rhythm is regular, somewhat slow with a rate in the 60s.  No  particular murmurs.  ABDOMEN:  Soft.  Nontender.  EXTREMITIES:  Normal.  NEUROLOGIC:  Exam intact.  SKIN: Normal.   SIGNIFICANT LABS:  CBC and MET-7 normal.  Potassium a hair low at 2.4.  Cardiac enzymes negative.  EKG normal sinus rhythm.  No significant ST-T  changes.   IMPRESSION:  1. Syncopal event, etiology is unclear.  With the tingling sensation about     the face and then the subsequent passing out, this is, at most, an     atypical syncopal event.  The patient is, at an age, with the nonspecific     chest discomfort observation is warranted and this is discussed with the     patient.  2. Sinusitis.  3. History of depression/anxiety.  4. Insomnia.  5. Irritable bowel syndrome.  The patient admits to a frequent tendency     towards loose stools recently.   PLAN:  As per orders.                                               Donna Bernard, M.D.    Karie Chimera  D:  06/29/2003  T:  06/29/2003  Job:  [id number]

## 2011-06-22 ENCOUNTER — Encounter: Payer: Self-pay | Admitting: Cardiology

## 2011-07-29 ENCOUNTER — Other Ambulatory Visit: Payer: Self-pay | Admitting: Family Medicine

## 2011-07-29 ENCOUNTER — Ambulatory Visit (HOSPITAL_COMMUNITY)
Admission: RE | Admit: 2011-07-29 | Discharge: 2011-07-29 | Disposition: A | Discharge status: Home / Self Care | Payer: Commercial Indemnity | Source: Ambulatory Visit | Attending: Family Medicine | Admitting: Family Medicine

## 2011-07-29 DIAGNOSIS — R52 Pain, unspecified: Secondary | ICD-10-CM

## 2011-07-29 DIAGNOSIS — M25569 Pain in unspecified knee: Secondary | ICD-10-CM | POA: Insufficient documentation

## 2011-07-29 DIAGNOSIS — M546 Pain in thoracic spine: Secondary | ICD-10-CM | POA: Insufficient documentation

## 2011-10-26 ENCOUNTER — Encounter: Payer: Self-pay | Admitting: Gastroenterology

## 2011-10-26 ENCOUNTER — Ambulatory Visit (INDEPENDENT_AMBULATORY_CARE_PROVIDER_SITE_OTHER): Payer: Commercial Indemnity | Admitting: Gastroenterology

## 2011-10-26 VITALS — BP 120/68 | HR 72 | Ht 66.0 in | Wt 163.0 lb

## 2011-10-26 DIAGNOSIS — K219 Gastro-esophageal reflux disease without esophagitis: Secondary | ICD-10-CM | POA: Insufficient documentation

## 2011-10-26 DIAGNOSIS — K625 Hemorrhage of anus and rectum: Secondary | ICD-10-CM

## 2011-10-26 DIAGNOSIS — R634 Abnormal weight loss: Secondary | ICD-10-CM

## 2011-10-26 NOTE — Assessment & Plan Note (Signed)
Clinically these probably due to hemorrhoids. A more proximal likely the source should be ruled out.  Recommendations #1 colonoscopy

## 2011-10-26 NOTE — Patient Instructions (Addendum)
You have been given a separate informational sheet regarding your tobacco use, the importance of quitting and local resources to help you quit.  Your Colonoscopy/Endoscopy has been scheduled on 10/27/2011 at 10:30am We have given you a Suprep kit today  Colonoscopy A colonoscopy is an exam to evaluate your entire colon. In this exam, your colon is cleansed. A long fiberoptic tube is inserted through your rectum and into your colon. The fiberoptic scope (endoscope) is a long bundle of enclosed and very flexible fibers. These fibers transmit light to the area examined and send images from that area to your caregiver. Discomfort is usually minimal. You may be given a drug to help you sleep (sedative) during or prior to the procedure. This exam helps to detect lumps (tumors), polyps, inflammation, and areas of bleeding. Your caregiver may also take a small piece of tissue (biopsy) that will be examined under a microscope. LET YOUR CAREGIVER KNOW ABOUT:   Allergies to food or medicine.   Medicines taken, including vitamins, herbs, eyedrops, over-the-counter medicines, and creams.   Use of steroids (by mouth or creams).   Previous problems with anesthetics or numbing medicines.   History of bleeding problems or blood clots.   Previous surgery.   Other health problems, including diabetes and kidney problems.   Possibility of pregnancy, if this applies.  BEFORE THE PROCEDURE   A clear liquid diet may be required for 2 days before the exam.   Ask your caregiver about changing or stopping your regular medications.   Liquid injections (enemas) or laxatives may be required.   A large amount of electrolyte solution may be given to you to drink over a short period of time. This solution is used to clean out your colon.   You should be present 60 minutes prior to your procedure or as directed by your caregiver.  AFTER THE PROCEDURE   If you received a sedative or pain relieving medication, you  will need to arrange for someone to drive you home.   Occasionally, there is a little blood passed with the first bowel movement. Do not be concerned.  FINDING OUT THE RESULTS OF YOUR TEST Not all test results are available during your visit. If your test results are not back during the visit, make an appointment with your caregiver to find out the results. Do not assume everything is normal if you have not heard from your caregiver or the medical facility. It is important for you to follow up on all of your test results. HOME CARE INSTRUCTIONS   It is not unusual to pass moderate amounts of gas and experience mild abdominal cramping following the procedure. This is due to air being used to inflate your colon during the exam. Walking or a warm pack on your belly (abdomen) may help.   You may resume all normal meals and activities after sedatives and medicines have worn off.   Only take over-the-counter or prescription medicines for pain, discomfort, or fever as directed by your caregiver. Do not use aspirin or blood thinners if a biopsy was taken. Consult your caregiver for medicine usage if biopsies were taken.  SEEK IMMEDIATE MEDICAL CARE IF:   You have a fever.   You pass large blood clots or fill a toilet with blood following the procedure. This may also occur 10 to 14 days following the procedure. This is more likely if a biopsy was taken.   You develop abdominal pain that keeps getting worse and cannot be relieved  with medicine.  Document Released: 11/25/2000 Document Revised: 08/10/2011 Document Reviewed: 07/10/2008 [facility] Patient Information 2012 [place], [place].

## 2011-10-26 NOTE — Assessment & Plan Note (Signed)
Weight loss is probably due to lack of appetite. Etiology is not certain by exam or history. Hyperthyroidism is unlikely in the face of a normal TSH. Occult malignancy should be ruled out. Patient does suffer from anxiety and depression and she appears somewhat depressed.  She is a smoker.  Recommendations #1 upper and lower endoscopy #2 CT the chest abdomen and pelvis if #1 is negative

## 2011-10-26 NOTE — Assessment & Plan Note (Signed)
Symptoms are all well controlled with omeprazole. She does have some dysphagia and a history of esophageal stricture.  Recommendations #1 upper endoscopy

## 2011-10-26 NOTE — Progress Notes (Signed)
Mrs. Rowton is a 50 year old white female referred at the request of Dr. Gerda Diss for evaluation of weight loss. She has lost 30 pounds over the last 6-8 months. She attributes this to loss of appetite. She has a history of GERD which has worsened lately. She increased her protonix to twice a day with some relief. She has a history of an esophageal stricture and occasionally has dysphagia to solids. She also has a history of IBS and regularly has alternating episodes of constipation and diarrhea. She's had rectal discomfort with minimal amounts of bleeding on the toilet tissue which she attributes to hemorrhoids.  She complains of early satiety and mild anorexia. She denies frank melena or hematochezia.  Recent lab work including liver function tests, TSH was unremarkable.      Past Medical History  Diagnosis Date  . IBS (irritable bowel syndrome)   . Anxiety   . Depression     bipolar disorder  . GERD (gastroesophageal reflux disease)   . HLD (hyperlipidemia)   . HTN (hypertension)   . Hypothyroidism   . OSA (obstructive sleep apnea)   . Heart defect   . Arthritis    Past Surgical History  Procedure Date  . Cesarean sections     x2  . Tubal ligation   . Abdominal hernia repair   . Vaginal hysterectomy   . Cholecystectomy   . Foot surgery     screws in left foot  . Breast cyst excision     left  . Dental implants   . Pacemaker insertion    family history includes Arthritis in her mother; Asthma in her mother; Heart disease in her mother; Throat cancer in her father; and Tongue cancer in her father. Current Outpatient Prescriptions  Medication Sig Dispense Refill  . Cholecalciferol (VITAMIN D3) 1000 UNITS tablet Take 1,000 Units by mouth daily.        . Cyanocobalamin (VITAMIN B-12) 1000 MCG SUBL Place under the tongue daily.        Marland Kitchen levothyroxine (LEVOTHROID) 50 MCG tablet Take 50 mcg by mouth daily.        Marland Kitchen omeprazole (PRILOSEC) 20 MG capsule Take 20 mg by mouth 2 (two)  times daily.       . pravastatin (PRAVACHOL) 40 MG tablet Take 40 mg by mouth daily.         Allergies as of 10/26/2011 - Review Complete 10/26/2011  Allergen Reaction Noted  . Codeine    . Povidone-iodine    . Sulfonamide derivatives      reports that she has been smoking Cigarettes.  She has been smoking about 1 pack per day. She has never used smokeless tobacco. She reports that she drinks alcohol. She reports that she does not use illicit drugs.     Review of Systems: She complains of arthritic pain in her elbows and anxiety. Pertinent positive and negative review of systems were noted in the above HPI section. All other review of systems were otherwise negative.  Vital signs were reviewed in today's medical record Physical Exam: General: Well developed , well nourished, no acute distress Head: Normocephalic and atraumatic Eyes:  sclerae anicteric, EOMI Ears: Normal auditory acuity Mouth: No deformity or lesions Neck: Supple, no masses or thyromegaly Lungs: Clear throughout to auscultation Heart: Regular rate and rhythm; no murmurs, rubs or bruits Abdomen: Soft, non tender and non distended. No masses, hepatosplenomegaly or hernias noted. Normal Bowel sounds Rectal:deferred Musculoskeletal: Symmetrical with no gross deformities  Skin: No lesions  on visible extremities Pulses:  Normal pulses noted Extremities: No clubbing, cyanosis, edema or deformities noted Neurological: Alert oriented x 4, grossly nonfocal Cervical Nodes:  No significant cervical adenopathy Inguinal Nodes: No significant inguinal adenopathy Psychological:  Alert and cooperative. Normal mood and affect       

## 2011-10-27 ENCOUNTER — Encounter: Payer: Self-pay | Admitting: Gastroenterology

## 2011-10-27 ENCOUNTER — Ambulatory Visit (AMBULATORY_SURGERY_CENTER): Payer: Commercial Indemnity | Admitting: Gastroenterology

## 2011-10-27 DIAGNOSIS — K625 Hemorrhage of anus and rectum: Secondary | ICD-10-CM

## 2011-10-27 DIAGNOSIS — D126 Benign neoplasm of colon, unspecified: Secondary | ICD-10-CM

## 2011-10-27 DIAGNOSIS — K222 Esophageal obstruction: Secondary | ICD-10-CM

## 2011-10-27 DIAGNOSIS — D129 Benign neoplasm of anus and anal canal: Secondary | ICD-10-CM

## 2011-10-27 DIAGNOSIS — K219 Gastro-esophageal reflux disease without esophagitis: Secondary | ICD-10-CM

## 2011-10-27 DIAGNOSIS — R634 Abnormal weight loss: Secondary | ICD-10-CM

## 2011-10-27 DIAGNOSIS — D128 Benign neoplasm of rectum: Secondary | ICD-10-CM

## 2011-10-27 MED ORDER — SODIUM CHLORIDE 0.9 % IV SOLN: 500.0000 mL | INTRAVENOUS | Status: DC

## 2011-10-28 ENCOUNTER — Telehealth: Payer: Self-pay

## 2011-10-28 ENCOUNTER — Other Ambulatory Visit: Payer: Self-pay | Admitting: Gastroenterology

## 2011-10-28 DIAGNOSIS — R634 Abnormal weight loss: Secondary | ICD-10-CM

## 2011-10-28 NOTE — Telephone Encounter (Signed)
Left message

## 2011-10-28 NOTE — Telephone Encounter (Signed)
Pt scheduled for CT of CAP @[facility]  CT 11/01/11 arrival time 8:45am for a 9am appt. Pt to be NPO 4 hours prior to test. Pt to drink 1st bottle of contrast at 7am, second bottle at 8am. Pt aware of appt dates and times.

## 2011-11-01 ENCOUNTER — Ambulatory Visit (INDEPENDENT_AMBULATORY_CARE_PROVIDER_SITE_OTHER)
Admission: RE | Admit: 2011-11-01 | Discharge: 2011-11-01 | Disposition: A | Discharge status: Home / Self Care | Payer: Commercial Indemnity | Source: Ambulatory Visit | Attending: Gastroenterology | Admitting: Gastroenterology

## 2011-11-01 DIAGNOSIS — R634 Abnormal weight loss: Secondary | ICD-10-CM

## 2011-11-01 DIAGNOSIS — R109 Unspecified abdominal pain: Secondary | ICD-10-CM

## 2011-11-01 MED ORDER — IOHEXOL 300 MG/ML  SOLN
100.0000 mL | Freq: Once | INTRAMUSCULAR | Status: AC | PRN
  Administered 2011-11-01: 100 mL via INTRAVENOUS

## 2011-11-02 ENCOUNTER — Telehealth: Payer: Self-pay

## 2011-11-02 NOTE — Progress Notes (Signed)
Quick Note:  Please inform the patient that CT was normal and to continue current plan of action. Needs OV in 8 weeks. Send CT report to Dr. Gerda Diss. Make note that she needs f/u chest CT in 1 year ______

## 2011-11-02 NOTE — Telephone Encounter (Signed)
Message left for pt regarding results. Pt has OV appt for 11/17/11@10 :30am. CT faxed to Dr. Gerda Diss. Reminder in system for CT of chest in 1 year.

## 2011-11-02 NOTE — Telephone Encounter (Signed)
Message copied by Michele Mcalpine on Wed Nov 02, 2011 12:10 PM ------      Message from: Melvia Heaps D      Created: Wed Nov 02, 2011 11:38 AM       Please inform the patient that CT was normal and to continue current plan of action.      Needs OV in 8 weeks.      Send CT report to Dr. Gerda Diss.      Make note that she needs f/u chest CT in 1 year

## 2011-11-17 ENCOUNTER — Ambulatory Visit: Payer: Commercial Indemnity | Admitting: Gastroenterology

## 2011-11-21 ENCOUNTER — Encounter: Payer: Self-pay | Admitting: Internal Medicine

## 2011-11-21 ENCOUNTER — Ambulatory Visit (INDEPENDENT_AMBULATORY_CARE_PROVIDER_SITE_OTHER): Payer: Commercial Indemnity | Admitting: Internal Medicine

## 2011-11-21 DIAGNOSIS — F17200 Nicotine dependence, unspecified, uncomplicated: Secondary | ICD-10-CM

## 2011-11-21 DIAGNOSIS — R55 Syncope and collapse: Secondary | ICD-10-CM

## 2011-11-21 DIAGNOSIS — Z950 Presence of cardiac pacemaker: Secondary | ICD-10-CM

## 2011-11-21 DIAGNOSIS — I442 Atrioventricular block, complete: Secondary | ICD-10-CM

## 2011-11-21 LAB — PACEMAKER DEVICE OBSERVATION
AL AMPLITUDE: 3.2 mv
AL IMPEDENCE PM: 450 Ohm
BAMS-0001: 180 {beats}/min
BATTERY VOLTAGE: 2.9779 V
RV LEAD AMPLITUDE: 5.4 mv

## 2011-11-21 NOTE — Progress Notes (Signed)
HPI [name] returns today for followup. She is a 50 yo woman with symptomatic bradycardia, s/p PPM. She has had no additional syncope since her device was last placed. No other complaints today.  Allergies  Allergen Reactions  . Codeine   . Povidone-Iodine   . Sulfonamide Derivatives      Current Outpatient Prescriptions  Medication Sig Dispense Refill  . Cholecalciferol (VITAMIN D3) 1000 UNITS tablet Take 1,000 Units by mouth daily.        . Cyanocobalamin (VITAMIN B-12) 1000 MCG SUBL Place under the tongue daily.        Marland Kitchen levothyroxine (LEVOTHROID) 50 MCG tablet Take 50 mcg by mouth daily.        Marland Kitchen omeprazole (PRILOSEC) 20 MG capsule Take 20 mg by mouth 2 (two) times daily.       . pravastatin (PRAVACHOL) 40 MG tablet Take 40 mg by mouth daily.           Past Medical History  Diagnosis Date  . IBS (irritable bowel syndrome)   . Anxiety   . Depression     bipolar disorder  . GERD (gastroesophageal reflux disease)   . HLD (hyperlipidemia)   . Hypothyroidism   . OSA (obstructive sleep apnea)   . Heart defect   . Arthritis     ROS:   All systems reviewed and negative except as noted in the HPI.   Past Surgical History  Procedure Date  . Cesarean sections     x2  . Tubal ligation   . Abdominal hernia repair   . Vaginal hysterectomy   . Cholecystectomy   . Foot surgery     screws in left foot  . Breast cyst excision     left  . Dental implants   . Pacemaker insertion      Family History  Problem Relation Age of Onset  . Throat cancer Father     squamous cell carcinoma  . Tongue cancer Father   . Esophageal cancer Father   . Arthritis Mother   . Heart disease Mother     coronary; female < 43  . Asthma Mother      History   Social History  . Marital Status: Married    Spouse Name: N/A    Number of Children: 2  . Years of Education: N/A   Occupational History  . retired    Social History Main Topics  . Smoking status: Current Everyday Smoker  -- 1.0 packs/day    Types: Cigarettes  . Smokeless tobacco: Never Used  . Alcohol Use: Yes     Occ.   . Drug Use: No  . Sexually Active: Not on file   Other Topics Concern  . Not on file   Social History Narrative   Unemployed. 10 cans of soda per day.      BP 132/83  Pulse 72  Resp 16  Ht 5\' 6"  (1.676 m)  Wt 74.39 kg (164 lb)  BMI 26.47 kg/m2  Physical Exam:  Well appearing NAD HEENT: Unremarkable Neck:  No JVD, no thyromegally Lungs:  Clear with no wheezes, rales, or rhonchi. Well healed PPM incision. HEART:  Regular rate rhythm, no murmurs, no rubs, no clicks Abd:  soft, positive bowel sounds, no organomegally, no rebound, no guarding Ext:  2 plus pulses, no edema, no cyanosis, no clubbing Skin:  No rashes no nodules Neuro:  CN II through XII intact, motor grossly intact  DEVICE  Normal device function.  See  PaceArt for details.   Assess/Plan:

## 2011-11-21 NOTE — Assessment & Plan Note (Signed)
She is starting to try and quit. She is down to a pack a day.

## 2011-11-21 NOTE — Patient Instructions (Signed)
Your physician recommends that you schedule a follow-up appointment in: 12 months.  

## 2011-11-21 NOTE — Assessment & Plan Note (Signed)
No recurrent symptoms since her device was last placed. Will follow.

## 2011-11-21 NOTE — Assessment & Plan Note (Signed)
Her device is working normally. Will recheck in several months. 

## 2011-12-28 ENCOUNTER — Ambulatory Visit (HOSPITAL_COMMUNITY)
Admission: RE | Admit: 2011-12-28 | Discharge: 2011-12-28 | Disposition: A | Discharge status: Home / Self Care | Payer: Commercial Indemnity | Source: Ambulatory Visit | Attending: Family Medicine | Admitting: Family Medicine

## 2011-12-28 ENCOUNTER — Other Ambulatory Visit: Payer: Self-pay | Admitting: Family Medicine

## 2011-12-28 DIAGNOSIS — M79673 Pain in unspecified foot: Secondary | ICD-10-CM

## 2011-12-28 DIAGNOSIS — M25572 Pain in left ankle and joints of left foot: Secondary | ICD-10-CM

## 2011-12-28 DIAGNOSIS — M25579 Pain in unspecified ankle and joints of unspecified foot: Secondary | ICD-10-CM | POA: Insufficient documentation

## 2012-02-23 ENCOUNTER — Ambulatory Visit (INDEPENDENT_AMBULATORY_CARE_PROVIDER_SITE_OTHER): Payer: Commercial Indemnity | Admitting: *Deleted

## 2012-02-23 ENCOUNTER — Encounter: Payer: Self-pay | Admitting: Internal Medicine

## 2012-02-23 DIAGNOSIS — I442 Atrioventricular block, complete: Secondary | ICD-10-CM

## 2012-02-23 LAB — REMOTE PACEMAKER DEVICE
ATRIAL PACING PM: 2.61
BAMS-0001: 180 {beats}/min
BAMS-0003: 70 {beats}/min
BATTERY VOLTAGE: 2.96 V

## 2012-03-08 NOTE — Progress Notes (Signed)
Remote pacer check  

## 2012-03-12 ENCOUNTER — Encounter: Payer: Self-pay | Admitting: *Deleted

## 2012-05-24 ENCOUNTER — Encounter: Payer: Self-pay | Admitting: Internal Medicine

## 2012-05-24 ENCOUNTER — Ambulatory Visit (INDEPENDENT_AMBULATORY_CARE_PROVIDER_SITE_OTHER): Payer: Commercial Indemnity | Admitting: *Deleted

## 2012-05-24 DIAGNOSIS — I442 Atrioventricular block, complete: Secondary | ICD-10-CM

## 2012-05-24 DIAGNOSIS — Z950 Presence of cardiac pacemaker: Secondary | ICD-10-CM

## 2012-05-25 LAB — REMOTE PACEMAKER DEVICE
BAMS-0001: 180 {beats}/min
DEVICE MODEL PM: 7192850
RV LEAD AMPLITUDE: 5.9 mv
RV LEAD IMPEDENCE PM: 440 Ohm
RV LEAD THRESHOLD: 1.25 V

## 2012-06-04 ENCOUNTER — Encounter: Payer: Self-pay | Admitting: *Deleted

## 2012-08-27 ENCOUNTER — Encounter: Payer: Self-pay | Admitting: *Deleted

## 2012-08-27 ENCOUNTER — Ambulatory Visit (INDEPENDENT_AMBULATORY_CARE_PROVIDER_SITE_OTHER): Payer: Commercial Indemnity | Admitting: *Deleted

## 2012-08-27 DIAGNOSIS — Z950 Presence of cardiac pacemaker: Secondary | ICD-10-CM

## 2012-08-27 DIAGNOSIS — I442 Atrioventricular block, complete: Secondary | ICD-10-CM

## 2012-08-27 LAB — REMOTE PACEMAKER DEVICE
AL IMPEDENCE PM: 440 Ohm
ATRIAL PACING PM: 3.2
BAMS-0003: 70 {beats}/min
RV LEAD IMPEDENCE PM: 430 Ohm
RV LEAD THRESHOLD: 1.125 V
VENTRICULAR PACING PM: 1

## 2012-09-25 ENCOUNTER — Encounter: Payer: Self-pay | Admitting: Internal Medicine

## 2012-10-29 ENCOUNTER — Other Ambulatory Visit: Payer: Self-pay | Admitting: Family Medicine

## 2012-10-29 DIAGNOSIS — R634 Abnormal weight loss: Secondary | ICD-10-CM

## 2012-10-29 DIAGNOSIS — R109 Unspecified abdominal pain: Secondary | ICD-10-CM

## 2012-11-01 ENCOUNTER — Ambulatory Visit (HOSPITAL_COMMUNITY): Payer: Commercial Indemnity

## 2012-11-16 ENCOUNTER — Telehealth: Payer: Self-pay | Admitting: Gastroenterology

## 2012-11-16 NOTE — Telephone Encounter (Signed)
Pt was supposed to have repeat CT of chest in 1 year. Pt is due for this scan. Pt states she cannot do it now, she just had surgery on her foot. Pt states she will call us back when she is able to have the scan done.

## 2013-01-08 ENCOUNTER — Encounter: Payer: Self-pay | Admitting: *Deleted

## 2013-03-11 ENCOUNTER — Encounter: Payer: Self-pay | Admitting: Internal Medicine

## 2013-03-11 ENCOUNTER — Ambulatory Visit (INDEPENDENT_AMBULATORY_CARE_PROVIDER_SITE_OTHER): Payer: Commercial Indemnity | Admitting: Internal Medicine

## 2013-03-11 VITALS — BP 104/60 | HR 68 | Ht 66.0 in | Wt 179.0 lb

## 2013-03-11 DIAGNOSIS — Z950 Presence of cardiac pacemaker: Secondary | ICD-10-CM

## 2013-03-11 DIAGNOSIS — F17200 Nicotine dependence, unspecified, uncomplicated: Secondary | ICD-10-CM

## 2013-03-11 DIAGNOSIS — I442 Atrioventricular block, complete: Secondary | ICD-10-CM

## 2013-03-11 DIAGNOSIS — R55 Syncope and collapse: Secondary | ICD-10-CM

## 2013-03-11 LAB — PACEMAKER DEVICE OBSERVATION
ATRIAL PACING PM: 2.3
BAMS-0001: 180 {beats}/min
BAMS-0003: 70 {beats}/min
BATTERY VOLTAGE: 2.9629 V
DEVICE MODEL PM: 7192850
RV LEAD THRESHOLD: 1.125 V
VENTRICULAR PACING PM: 1

## 2013-03-11 NOTE — Progress Notes (Signed)
HPI [name] returns today for followup. She is a very pleasant middle-age woman with symptomatic bradycardia, status post permanent pacemaker insertion, tobacco abuse, and gastroesophageal reflux disease. She is currently not working because she had had surgery on her foot. She denies chest pain or shortness of breath. No peripheral edema. Allergies  Allergen Reactions  . Codeine   . Povidone   . Sulfonamide Derivatives      Current Outpatient Prescriptions  Medication Sig Dispense Refill  . aspirin 81 MG tablet Take 162 mg by mouth daily.      . Cholecalciferol (VITAMIN D3) 1000 UNITS tablet Take 1,000 Units by mouth daily.        . fluticasone (FLONASE) 50 MCG/ACT nasal spray Place 2 sprays into the nose as needed.       Marland Kitchen levothyroxine (LEVOTHROID) 50 MCG tablet Take 50 mcg by mouth daily.        Marland Kitchen omeprazole (PRILOSEC) 20 MG capsule Take 20 mg by mouth 2 (two) times daily.        No current facility-administered medications for this visit.     Past Medical History  Diagnosis Date  . IBS (irritable bowel syndrome)   . Anxiety   . Depression     bipolar disorder  . GERD (gastroesophageal reflux disease)   . HLD (hyperlipidemia)   . Hypothyroidism   . OSA (obstructive sleep apnea)   . Heart defect   . Arthritis     ROS:   All systems reviewed and negative except as noted in the HPI.   Past Surgical History  Procedure Laterality Date  . Cesarean sections      x2  . Tubal ligation    . Abdominal hernia repair    . Vaginal hysterectomy    . Cholecystectomy    . Foot surgery      screws in left foot  . Breast cyst excision      left  . Dental implants    . Pacemaker insertion       Family History  Problem Relation Age of Onset  . Throat cancer Father     squamous cell carcinoma  . Tongue cancer Father   . Esophageal cancer Father   . Arthritis Mother   . Heart disease Mother     coronary; female < 19  . Asthma Mother      History   Social  History  . Marital Status: Married    Spouse Name: N/A    Number of Children: 2  . Years of Education: N/A   Occupational History  . retired    Social History Main Topics  . Smoking status: Current Every Day Smoker -- 1.00 packs/day    Types: Cigarettes  . Smokeless tobacco: Never Used  . Alcohol Use: Yes     Comment: Occ.   . Drug Use: No  . Sexually Active: Not on file   Other Topics Concern  . Not on file   Social History Narrative   Unemployed. 10 cans of soda per day.      BP 104/60  Pulse 68  Ht 5\' 6"  (1.676 m)  Wt 179 lb (81.194 kg)  BMI 28.91 kg/m2  SpO2 98%  Physical Exam:  Well appearing middle-age woman,NAD HEENT: Unremarkable Neck:  7 cm JVD, no thyromegally Lungs:  Clear with no wheezes, rales, or rhonchi. Well-healed pacemaker incision. HEART:  Regular rate rhythm, no murmurs, no rubs, no clicks Abd:  soft, positive bowel sounds, no organomegally, no  rebound, no guarding Ext:  2 plus pulses, no edema, no cyanosis, no clubbing Skin:  No rashes no nodules Neuro:  CN II through XII intact, motor grossly intact  DEVICE  Normal device function.  See PaceArt for details.   Assess/Plan:

## 2013-03-11 NOTE — Patient Instructions (Signed)
Your physician recommends that you schedule a follow-up appointment in: 1 year with Dr Francee Piccolo Transmission on July 7 th

## 2013-03-11 NOTE — Assessment & Plan Note (Signed)
Her St. Jude dual-chamber pacemaker is working normally. We'll plan to recheck in several months. 

## 2013-03-11 NOTE — Assessment & Plan Note (Signed)
She continues to smoke cigarettes. We'll ask her to stop.

## 2013-03-11 NOTE — Assessment & Plan Note (Signed)
She has had no recurrent syncopal episodes since her pacemaker was placed. She will undergo watchful waiting.

## 2013-05-22 ENCOUNTER — Encounter: Payer: Self-pay | Admitting: *Deleted

## 2013-06-17 ENCOUNTER — Ambulatory Visit (INDEPENDENT_AMBULATORY_CARE_PROVIDER_SITE_OTHER): Payer: Commercial Indemnity | Admitting: *Deleted

## 2013-06-17 ENCOUNTER — Encounter: Payer: Self-pay | Admitting: Internal Medicine

## 2013-06-17 DIAGNOSIS — Z950 Presence of cardiac pacemaker: Secondary | ICD-10-CM

## 2013-06-17 DIAGNOSIS — I442 Atrioventricular block, complete: Secondary | ICD-10-CM

## 2013-06-17 LAB — REMOTE PACEMAKER DEVICE
AL AMPLITUDE: 4.8 mv
AL IMPEDENCE PM: 440 Ohm
AL THRESHOLD: 1 V
ATRIAL PACING PM: 1
DEVICE MODEL PM: 7192850
RV LEAD IMPEDENCE PM: 450 Ohm

## 2013-06-21 ENCOUNTER — Encounter: Payer: Self-pay | Admitting: *Deleted

## 2013-06-24 ENCOUNTER — Encounter: Payer: Self-pay | Admitting: Family Medicine

## 2013-06-24 ENCOUNTER — Ambulatory Visit (INDEPENDENT_AMBULATORY_CARE_PROVIDER_SITE_OTHER): Payer: Managed Care, Other (non HMO) | Admitting: Family Medicine

## 2013-06-24 VITALS — BP 136/94 | HR 70 | Wt 197.2 lb

## 2013-06-24 DIAGNOSIS — M549 Dorsalgia, unspecified: Secondary | ICD-10-CM

## 2013-06-24 MED ORDER — CHLORZOXAZONE 500 MG PO TABS: 500.0000 mg | ORAL_TABLET | Freq: Four times a day (QID) | ORAL | Status: DC | PRN

## 2013-06-24 MED ORDER — ETODOLAC 400 MG PO TABS: 400.0000 mg | ORAL_TABLET | Freq: Two times a day (BID) | ORAL | Status: DC

## 2013-06-24 NOTE — Progress Notes (Signed)
  Subjective:    Patient ID: [name], female    DOB: 1961-06-04, 52 y.o.   MRN: [id number]  Hip Pain  The incident occurred more than 1 week ago. There was no injury mechanism. Pain location: left post back. The quality of the pain is described as shooting. The pain is at a severity of 7/10. The pain is moderate. The pain has been improving since onset. Pertinent negatives include no inability to bear weight, loss of sensation or muscle weakness.   Low back pain. Sharp. Worse with certain motions. Recalls no injury.   Review of Systems No vomiting no diarrhea no change in bowel habits no fever no chills ROS otherwise negative    Objective:   Physical Exam  Alert no acute distress lungs clear. Heart regular in rhythm. Blood pressure improved on repeat 136/82. Positive left lower lumbar tenderness. Negative straight leg raise.      Assessment & Plan:  Impression lumbar strain-discussed. Plan Lodine generic twice a day with food. Chlorzoxazone 500 3 times a day. Local measures discussed. WSL

## 2013-09-23 ENCOUNTER — Ambulatory Visit (INDEPENDENT_AMBULATORY_CARE_PROVIDER_SITE_OTHER): Payer: Commercial Indemnity | Admitting: *Deleted

## 2013-09-23 ENCOUNTER — Encounter: Payer: Self-pay | Admitting: Nurse Practitioner

## 2013-09-23 ENCOUNTER — Ambulatory Visit (INDEPENDENT_AMBULATORY_CARE_PROVIDER_SITE_OTHER): Payer: Managed Care, Other (non HMO) | Admitting: Nurse Practitioner

## 2013-09-23 VITALS — BP 122/78 | Temp 98.0°F | Ht 66.0 in | Wt 205.0 lb

## 2013-09-23 DIAGNOSIS — I442 Atrioventricular block, complete: Secondary | ICD-10-CM

## 2013-09-23 DIAGNOSIS — L301 Dyshidrosis [pompholyx]: Secondary | ICD-10-CM

## 2013-09-23 DIAGNOSIS — B309 Viral conjunctivitis, unspecified: Secondary | ICD-10-CM

## 2013-09-23 DIAGNOSIS — J069 Acute upper respiratory infection, unspecified: Secondary | ICD-10-CM

## 2013-09-23 MED ORDER — PREDNISONE 20 MG PO TABS: ORAL_TABLET | ORAL | Status: DC

## 2013-09-23 MED ORDER — CLOBETASOL PROPIONATE 0.05 % EX CREA: TOPICAL_CREAM | Freq: Two times a day (BID) | CUTANEOUS | Status: DC

## 2013-09-23 NOTE — Patient Instructions (Signed)
OTC antihistamine Flonase nasal spray Zaditor eye drops Hydrocortisone on face

## 2013-09-25 ENCOUNTER — Encounter: Payer: Self-pay | Admitting: Nurse Practitioner

## 2013-09-25 LAB — REMOTE PACEMAKER DEVICE
AL AMPLITUDE: 2.3 mv
AL IMPEDENCE PM: 440 Ohm
AL THRESHOLD: 1 V
BATTERY VOLTAGE: 2.96 V
DEVICE MODEL PM: 7192850
RV LEAD AMPLITUDE: 4.9 mv
RV LEAD IMPEDENCE PM: 450 Ohm
VENTRICULAR PACING PM: 1

## 2013-09-25 NOTE — Progress Notes (Signed)
Subjective:  Presents for c/o swelling and rash. Began having a dry itchy rash limited to the hands and fingers about a month ago after using a liquid soap while out of town. Starts with a small white clear fluid filled bumps which rupture and cause itching. No fever. No known contacts. No change in her hygiene products. Also some mild burning and itching around the eyes at times the past 2 weeks. Yesterday but had some slight swelling. Minimal drainage mainly in the mornings. No visual changes. No difficulty breathing or swallowing. Occasional ethmoid sinus area headache. Rare cough. No wheezing. Slight sore throat. No ear pain.  Objective:   BP 122/78  Temp(Src) 98 F (36.7 C) (Oral)  Ht 5\' 6"  (1.676 m)  Wt 205 lb (92.987 kg)  BMI 33.1 kg/m2 NAD. Alert, oriented. TMs retracted, no erythema. Conjunctiva clear bilaterally. Positive preauricular lymph nodes noted bilaterally. Pharynx injected with clear PND noted. Neck supple with mild soft nontender adenopathy. Lungs clear. Heart regular rate rhythm. No wheezing or tachypnea. Normal color. Slightly dry skin noted around the eyes. Dry skin with excoriation noted particularly along the fingers and hands.  Assessment:Dyshidrotic eczema  Acute upper respiratory infections of unspecified site  Viral conjunctivitis  possible  Plan: Meds ordered this encounter  Medications  . clobetasol cream (TEMOVATE) 0.05 %    Sig: Apply topically 2 (two) times daily.    Dispense:  30 g    Refill:  0    Order Specific Question:  Supervising Provider    Answer:  Merlyn Albert [[id number]]  . predniSONE (DELTASONE) 20 MG tablet    Sig: 3 po qd x 3 d then 2 po qd x 3 d then 1 po qd x 3 d    Dispense:  18 tablet    Refill:  0    Order Specific Question:  Supervising Provider    Answer:  Riccardo Dubin   May use hydrocortisone cream on face; no more than 2 weeks for either steroid cream. OTC meds as directed for congestion. Call back by then the week  if no improvement, sooner if worse. Given backup prescription for prednisone in case it is needed for worsening allergy symptoms. OTC antihistamine Flonase nasal spray Zaditor eye drops Hydrocortisone on face

## 2013-10-02 ENCOUNTER — Encounter: Payer: Self-pay | Admitting: *Deleted

## 2013-10-18 ENCOUNTER — Encounter: Payer: Self-pay | Admitting: Internal Medicine

## 2013-10-31 ENCOUNTER — Other Ambulatory Visit: Payer: Self-pay | Admitting: Family Medicine

## 2013-12-24 ENCOUNTER — Encounter: Payer: Commercial Indemnity | Admitting: *Deleted

## 2013-12-24 DIAGNOSIS — R55 Syncope and collapse: Secondary | ICD-10-CM

## 2013-12-24 DIAGNOSIS — Z950 Presence of cardiac pacemaker: Secondary | ICD-10-CM

## 2013-12-24 DIAGNOSIS — I442 Atrioventricular block, complete: Secondary | ICD-10-CM

## 2013-12-24 LAB — MDC_IDC_ENUM_SESS_TYPE_REMOTE
Battery Voltage: 2.95 V
Brady Statistic AP VP Percent: 1 %
Brady Statistic AP VS Percent: 1.2 %
Brady Statistic AS VP Percent: 1 %
Brady Statistic RV Percent Paced: 1 %
Implantable Pulse Generator Model: 2210
Implantable Pulse Generator Serial Number: 7192850
Lead Channel Impedance Value: 440 Ohm
Lead Channel Pacing Threshold Amplitude: 0.875 V
Lead Channel Pacing Threshold Amplitude: 1.25 V
Lead Channel Pacing Threshold Pulse Width: 0.4 ms
Lead Channel Sensing Intrinsic Amplitude: 5.2 mV
Lead Channel Setting Pacing Amplitude: 1.875
Lead Channel Setting Pacing Pulse Width: 0.4 ms
MDC IDC MSMT BATTERY REMAINING LONGEVITY: 104 mo
MDC IDC MSMT LEADCHNL RA SENSING INTR AMPL: 4.1 mV
MDC IDC MSMT LEADCHNL RV IMPEDANCE VALUE: 450 Ohm
MDC IDC MSMT LEADCHNL RV PACING THRESHOLD PULSEWIDTH: 0.4 ms
MDC IDC SESS DTM: 20150113083531
MDC IDC SET LEADCHNL RV PACING AMPLITUDE: 1.5 V
MDC IDC SET LEADCHNL RV SENSING SENSITIVITY: 2 mV
MDC IDC STAT BRADY AS VS PERCENT: 99 %
MDC IDC STAT BRADY RA PERCENT PACED: 1.1 %

## 2013-12-26 ENCOUNTER — Encounter: Payer: Self-pay | Admitting: *Deleted

## 2013-12-29 ENCOUNTER — Other Ambulatory Visit: Payer: Self-pay | Admitting: Family Medicine

## 2014-01-01 ENCOUNTER — Encounter: Payer: Self-pay | Admitting: Internal Medicine

## 2014-01-27 ENCOUNTER — Ambulatory Visit (INDEPENDENT_AMBULATORY_CARE_PROVIDER_SITE_OTHER): Payer: Managed Care, Other (non HMO) | Admitting: Family Medicine

## 2014-01-27 ENCOUNTER — Encounter: Payer: Self-pay | Admitting: Family Medicine

## 2014-01-27 VITALS — BP 112/80 | Temp 99.2°F | Ht 66.0 in | Wt 204.0 lb

## 2014-01-27 DIAGNOSIS — J310 Chronic rhinitis: Secondary | ICD-10-CM

## 2014-01-27 DIAGNOSIS — J329 Chronic sinusitis, unspecified: Secondary | ICD-10-CM

## 2014-01-27 MED ORDER — FLUTICASONE PROPIONATE 50 MCG/ACT NA SUSP: 1.0000 | Freq: Every day | NASAL | Status: DC

## 2014-01-27 MED ORDER — LEVOFLOXACIN 500 MG PO TABS: 500.0000 mg | ORAL_TABLET | Freq: Every day | ORAL | Status: AC

## 2014-01-27 NOTE — Progress Notes (Signed)
   Subjective:    Patient ID: [name], female    DOB: [birth year], 53 y.o.   MRN: [id number]  Cough This is a new problem. The current episode started in the past 7 days. Associated symptoms include a fever and headaches. Associated symptoms comments: Nausea, vomiting, runny nose. Treatments tried: delsym, cough drops, ibuprofen. The treatment provided no relief.    Started last thur  Bad cough, non prod  Bad headache whole thing with cough worse  delsyn prn  tmax 100,2  Was at the nursing home-sone sickness there  Review of Systems  Constitutional: Positive for fever.  Respiratory: Positive for cough.   Neurological: Positive for headaches.       Objective:   Physical Exam Alert moderate malaise. Hydration good. H&T moderate his congestion frontal tenderness. Trace normal. Neck supple. Lungs no crackles no tachypnea bronchial cough during exam heart regular in rhythm.       Assessment & Plan:  Impression post flu sinusitis and bronchitis plan Levaquin daily 10 days. Symptomatic care discussed. Warning signs discussed. WSL

## 2014-03-07 ENCOUNTER — Other Ambulatory Visit: Payer: Self-pay | Admitting: Internal Medicine

## 2014-03-07 ENCOUNTER — Encounter: Payer: Self-pay | Admitting: Internal Medicine

## 2014-03-07 ENCOUNTER — Ambulatory Visit (INDEPENDENT_AMBULATORY_CARE_PROVIDER_SITE_OTHER): Payer: Commercial Indemnity | Admitting: Internal Medicine

## 2014-03-07 VITALS — BP 124/75 | HR 66 | Ht 66.0 in | Wt 203.0 lb

## 2014-03-07 DIAGNOSIS — F17200 Nicotine dependence, unspecified, uncomplicated: Secondary | ICD-10-CM

## 2014-03-07 DIAGNOSIS — I442 Atrioventricular block, complete: Secondary | ICD-10-CM

## 2014-03-07 DIAGNOSIS — Z950 Presence of cardiac pacemaker: Secondary | ICD-10-CM

## 2014-03-07 LAB — MDC_IDC_ENUM_SESS_TYPE_INCLINIC
Battery Remaining Longevity: 106.8 mo
Brady Statistic RV Percent Paced: 0.08 %
Implantable Pulse Generator Model: 2210
Lead Channel Impedance Value: 425 Ohm
Lead Channel Pacing Threshold Amplitude: 0.875 V
Lead Channel Pacing Threshold Amplitude: 1.25 V
Lead Channel Pacing Threshold Pulse Width: 0.4 ms
Lead Channel Sensing Intrinsic Amplitude: 4.2 mV
Lead Channel Sensing Intrinsic Amplitude: 4.7 mV
Lead Channel Setting Pacing Amplitude: 1.875
Lead Channel Setting Pacing Pulse Width: 0.4 ms
Lead Channel Setting Sensing Sensitivity: 2 mV
MDC IDC MSMT BATTERY VOLTAGE: 2.95 V
MDC IDC MSMT LEADCHNL RA IMPEDANCE VALUE: 425 Ohm
MDC IDC MSMT LEADCHNL RA PACING THRESHOLD PULSEWIDTH: 0.4 ms
MDC IDC PG SERIAL: 7192850
MDC IDC SESS DTM: 20150327120059
MDC IDC SET LEADCHNL RV PACING AMPLITUDE: 1.5 V
MDC IDC STAT BRADY RA PERCENT PACED: 1.1 %

## 2014-03-07 NOTE — Progress Notes (Signed)
HPI [name] returns today for followup. She is a very pleasant middle-age woman with symptomatic bradycardia, status post permanent pacemaker insertion, tobacco abuse, and gastroesophageal reflux disease. She denies chest pain or shortness of breath. No peripheral edema. She has reduced her tobacco consumption from 2 ppd to 1/2 ppd.  Allergies  Allergen Reactions  . Betadine [Povidone Iodine]     Blisters   . Codeine   . Povidone-Iodine   . Sulfonamide Derivatives   . Vioxx [Rofecoxib] Rash     Current Outpatient Prescriptions  Medication Sig Dispense Refill  . aspirin 81 MG tablet Take 162 mg by mouth daily.      . Cholecalciferol (VITAMIN D3) 1000 UNITS tablet Take 1,000 Units by mouth daily.        . clobetasol cream (TEMOVATE) 0.05 % Apply topically 2 (two) times daily.  30 g  0  . fluticasone (FLONASE) 50 MCG/ACT nasal spray Place 1 spray into both nostrils daily.  16 g  5  . levothyroxine (SYNTHROID, LEVOTHROID) 50 MCG tablet TAKE ONE TABLET BY MOUTH EVERY DAY  30 tablet  3  . omeprazole (PRILOSEC) 20 MG capsule TAKE ONE CAPSULE BY MOUTH TWICE DAILY  60 capsule  5   No current facility-administered medications for this visit.     Past Medical History  Diagnosis Date  . IBS (irritable bowel syndrome)   . Anxiety   . Depression     bipolar disorder  . GERD (gastroesophageal reflux disease)   . HLD (hyperlipidemia)   . Hypothyroidism   . OSA (obstructive sleep apnea)   . Heart defect   . Arthritis   . Esophageal stricture   . Reflux   . IFG (impaired fasting glucose)   . Migraine headache     ROS:   All systems reviewed and negative except as noted in the HPI.   Past Surgical History  Procedure Laterality Date  . Cesarean sections      x2  . Tubal ligation    . Abdominal hernia repair    . Vaginal hysterectomy    . Cholecystectomy    . Foot surgery      screws in left foot  . Breast cyst excision      left  . Dental implants    . Pacemaker  insertion       Family History  Problem Relation Age of Onset  . Throat cancer Father     squamous cell carcinoma  . Tongue cancer Father   . Esophageal cancer Father   . Arthritis Mother   . Heart disease Mother     coronary; female < 16  . Asthma Mother      History   Social History  . Marital Status: Married    Spouse Name: N/A    Number of Children: 2  . Years of Education: N/A   Occupational History  . retired    Social History Main Topics  . Smoking status: Current Every Day Smoker -- 1.00 packs/day    Types: Cigarettes  . Smokeless tobacco: Never Used  . Alcohol Use: No     Comment: Occ.   . Drug Use: No  . Sexual Activity: Not on file   Other Topics Concern  . Not on file   Social History Narrative   Unemployed. 10 cans of soda per day.      BP 124/75  Pulse 66  Ht 5\' 6"  (1.676 m)  Wt 203 lb (92.08 kg)  BMI 32.78  kg/m2  SpO2 99%  Physical Exam:  Well appearing middle-age woman,NAD HEENT: Unremarkable Neck:  7 cm JVD, no thyromegally Lungs:  Clear with no wheezes, rales, or rhonchi. Well-healed pacemaker incision. HEART:  Regular rate rhythm, no murmurs, no rubs, no clicks Abd:  soft, positive bowel sounds, no organomegally, no rebound, no guarding Ext:  2 plus pulses, no edema, no cyanosis, no clubbing Skin:  No rashes no nodules Neuro:  CN II through XII intact, motor grossly intact  DEVICE  Normal device function.  See PaceArt for details.   Assess/Plan:

## 2014-03-07 NOTE — Assessment & Plan Note (Signed)
Her St. Jude DDD PM is working normally, Wellsite geologist. Will recheck in several months.

## 2014-03-07 NOTE — Assessment & Plan Note (Signed)
I discussed the importance of smoking cessation. Will follow.

## 2014-03-07 NOTE — Patient Instructions (Signed)
Your physician recommends that you schedule a follow-up appointment in: 12 months with Dr Knox Saliva will receive a reminder letter two months in advance reminding you to call and schedule your appointment. If you don't receive this letter, please contact our office.  Remote monitoring is used to monitor your Pacemaker or ICD from home. This monitoring reduces the number of office visits required to check your device to one time per year. It allows [place] to keep an eye on the functioning of your device to ensure it is working properly. You are scheduled for a device check from home on 06-09-14. You may send your transmission at any time that day. If you have a wireless device, the transmission will be sent automatically. After your physician reviews your transmission, you will receive a postcard with your next transmission date.

## 2014-06-09 ENCOUNTER — Ambulatory Visit (INDEPENDENT_AMBULATORY_CARE_PROVIDER_SITE_OTHER): Payer: Commercial Indemnity | Admitting: *Deleted

## 2014-06-09 DIAGNOSIS — R55 Syncope and collapse: Secondary | ICD-10-CM

## 2014-06-09 DIAGNOSIS — I442 Atrioventricular block, complete: Secondary | ICD-10-CM

## 2014-06-09 LAB — MDC_IDC_ENUM_SESS_TYPE_REMOTE
Battery Remaining Longevity: 113 mo
Battery Voltage: 2.96 V
Brady Statistic AP VP Percent: 1 %
Brady Statistic RA Percent Paced: 4.6 %
Brady Statistic RV Percent Paced: 1 %
Date Time Interrogation Session: 20150629073215
Implantable Pulse Generator Model: 2210
Implantable Pulse Generator Serial Number: 7192850
Lead Channel Impedance Value: 460 Ohm
Lead Channel Pacing Threshold Amplitude: 0.875 V
Lead Channel Pacing Threshold Amplitude: 1.25 V
Lead Channel Pacing Threshold Pulse Width: 0.4 ms
Lead Channel Sensing Intrinsic Amplitude: 5 mV
Lead Channel Setting Pacing Amplitude: 1.5 V
Lead Channel Setting Pacing Amplitude: 1.875
Lead Channel Setting Sensing Sensitivity: 2 mV
MDC IDC MSMT BATTERY REMAINING PERCENTAGE: 82 %
MDC IDC MSMT LEADCHNL RA IMPEDANCE VALUE: 430 Ohm
MDC IDC MSMT LEADCHNL RV PACING THRESHOLD PULSEWIDTH: 0.4 ms
MDC IDC MSMT LEADCHNL RV SENSING INTR AMPL: 5 mV
MDC IDC SET LEADCHNL RV PACING PULSEWIDTH: 0.4 ms
MDC IDC STAT BRADY AP VS PERCENT: 5.1 %
MDC IDC STAT BRADY AS VP PERCENT: 1 %
MDC IDC STAT BRADY AS VS PERCENT: 95 %

## 2014-06-09 NOTE — Progress Notes (Signed)
Remote pacemaker transmission.   

## 2014-06-14 ENCOUNTER — Other Ambulatory Visit: Payer: Self-pay | Admitting: Family Medicine

## 2014-06-18 ENCOUNTER — Encounter: Payer: Self-pay | Admitting: Cardiology

## 2014-06-24 ENCOUNTER — Encounter: Payer: Self-pay | Admitting: Internal Medicine

## 2014-07-04 ENCOUNTER — Encounter: Payer: Self-pay | Admitting: Family Medicine

## 2014-07-04 ENCOUNTER — Ambulatory Visit (INDEPENDENT_AMBULATORY_CARE_PROVIDER_SITE_OTHER): Payer: Managed Care, Other (non HMO) | Admitting: Family Medicine

## 2014-07-04 VITALS — BP 126/84 | Temp 98.5°F | Ht 66.0 in | Wt 196.5 lb

## 2014-07-04 DIAGNOSIS — E039 Hypothyroidism, unspecified: Secondary | ICD-10-CM

## 2014-07-04 DIAGNOSIS — R238 Other skin changes: Secondary | ICD-10-CM

## 2014-07-04 DIAGNOSIS — Z79899 Other long term (current) drug therapy: Secondary | ICD-10-CM

## 2014-07-04 DIAGNOSIS — R233 Spontaneous ecchymoses: Secondary | ICD-10-CM

## 2014-07-04 DIAGNOSIS — G4733 Obstructive sleep apnea (adult) (pediatric): Secondary | ICD-10-CM

## 2014-07-04 DIAGNOSIS — E785 Hyperlipidemia, unspecified: Secondary | ICD-10-CM

## 2014-07-04 MED ORDER — LEVOTHYROXINE SODIUM 50 MCG PO TABS: ORAL_TABLET | ORAL | Status: DC

## 2014-07-04 MED ORDER — CEPHALEXIN 500 MG PO CAPS: 500.0000 mg | ORAL_CAPSULE | Freq: Three times a day (TID) | ORAL | Status: DC

## 2014-07-04 NOTE — Progress Notes (Signed)
   Subjective:    Patient ID: [name], female    DOB: 22-Dec-1960, 53 y.o.   MRN: [id number]  HPI Patient arrives to the office with several concerns.  Patient is here today because she has been having some swelling of the lymph nodes below her chin. Patient also complains of sores in her mouth. This has been present for about 1 week now.  Patient has some white skin tags that has reappeared around her eyes and she believes that she needs to go back on cholesterol medication due to this. History of been on cholesterol medicine in the past. Developed achiness in the joints and so stopped. Watch his diet fairly but not great  Has stopped smoking. Now using vaper E. cigarettes.  Compliant with thyroid medication. Tries not to Miss a dose. No blood work for over a year.  Left side of jaw became very tend er and sore  Also has raw area on the mouth  Some sore throat, no heafache  No cough or cong  Review of Systems No headache no chest pain no back pain no symptoms of high or low thyroid no excess fatigue no loss of consciousness no rash ROS otherwise negative    Objective:   Physical Exam  Alert slight malaise HEENT small xanthelasma noted. At this ulcers of the mouth noted some acne on the cheeks. Tender anterior cervical nodes and some mandibular nodes neck supple. Lungs clear. Heart regular in rhythm thyroid nonpalpable      Assessment & Plan:  Impression 1 hypothyroidism status uncertain #2 hyperlipidemia status uncertain. #3 xanthelasma related to #2 discussed #4 lymphadenitis discussed plan antibiotics prescribed. Appropriate blood work. If lipids significantly high will research it medication. Diet exercise discussed. Further recommendations of thyroid medicine based on results. WSL

## 2014-07-05 LAB — HEPATIC FUNCTION PANEL
ALT: 11 U/L (ref 0–35)
AST: 14 U/L (ref 0–37)
Albumin: 4.1 g/dL (ref 3.5–5.2)
Alkaline Phosphatase: 86 U/L (ref 39–117)
BILIRUBIN DIRECT: 0.1 mg/dL (ref 0.0–0.3)
BILIRUBIN INDIRECT: 0.4 mg/dL (ref 0.2–1.2)
Total Bilirubin: 0.5 mg/dL (ref 0.2–1.2)
Total Protein: 6.9 g/dL (ref 6.0–8.3)

## 2014-07-05 LAB — CBC WITH DIFFERENTIAL/PLATELET
BASOS PCT: 1 % (ref 0–1)
Basophils Absolute: 0.1 10*3/uL (ref 0.0–0.1)
EOS ABS: 0.1 10*3/uL (ref 0.0–0.7)
Eosinophils Relative: 2 % (ref 0–5)
HEMATOCRIT: 44.1 % (ref 36.0–46.0)
Hemoglobin: 15.2 g/dL — ABNORMAL HIGH (ref 12.0–15.0)
Lymphocytes Relative: 30 % (ref 12–46)
Lymphs Abs: 2 10*3/uL (ref 0.7–4.0)
MCH: 29.3 pg (ref 26.0–34.0)
MCHC: 34.5 g/dL (ref 30.0–36.0)
MCV: 85 fL (ref 78.0–100.0)
MONO ABS: 0.7 10*3/uL (ref 0.1–1.0)
MONOS PCT: 10 % (ref 3–12)
NEUTROS PCT: 57 % (ref 43–77)
Neutro Abs: 3.9 10*3/uL (ref 1.7–7.7)
Platelets: 413 10*3/uL — ABNORMAL HIGH (ref 150–400)
RBC: 5.19 MIL/uL — AB (ref 3.87–5.11)
RDW: 13.5 % (ref 11.5–15.5)
WBC: 6.8 10*3/uL (ref 4.0–10.5)

## 2014-07-05 LAB — LIPID PANEL
Cholesterol: 183 mg/dL (ref 0–200)
HDL: 36 mg/dL — AB (ref >39)
LDL CALC: 113 mg/dL — AB (ref 0–99)
Total CHOL/HDL Ratio: 5.1 Ratio
Triglycerides: 169 mg/dL — ABNORMAL HIGH (ref <150)
VLDL: 34 mg/dL (ref 0–40)

## 2014-07-05 LAB — TSH: TSH: 4.113 u[IU]/mL (ref 0.350–4.500)

## 2014-07-10 ENCOUNTER — Encounter: Payer: Self-pay | Admitting: Family Medicine

## 2014-09-08 ENCOUNTER — Ambulatory Visit (INDEPENDENT_AMBULATORY_CARE_PROVIDER_SITE_OTHER): Payer: Commercial Indemnity | Admitting: *Deleted

## 2014-09-08 ENCOUNTER — Encounter: Payer: Self-pay | Admitting: Internal Medicine

## 2014-09-08 DIAGNOSIS — I442 Atrioventricular block, complete: Secondary | ICD-10-CM

## 2014-09-08 DIAGNOSIS — R55 Syncope and collapse: Secondary | ICD-10-CM

## 2014-09-08 LAB — MDC_IDC_ENUM_SESS_TYPE_REMOTE
Battery Remaining Longevity: 99 mo
Brady Statistic AP VS Percent: 6.4 %
Brady Statistic AS VP Percent: 1 %
Brady Statistic AS VS Percent: 93 %
Brady Statistic RA Percent Paced: 5.8 %
Date Time Interrogation Session: 20150928080523
Implantable Pulse Generator Model: 2210
Lead Channel Impedance Value: 440 Ohm
Lead Channel Pacing Threshold Amplitude: 1 V
Lead Channel Pacing Threshold Pulse Width: 0.4 ms
Lead Channel Pacing Threshold Pulse Width: 0.4 ms
Lead Channel Sensing Intrinsic Amplitude: 4.6 mV
Lead Channel Setting Pacing Amplitude: 1.5 V
MDC IDC MSMT BATTERY REMAINING PERCENTAGE: 71 %
MDC IDC MSMT BATTERY VOLTAGE: 2.95 V
MDC IDC MSMT LEADCHNL RA SENSING INTR AMPL: 4.3 mV
MDC IDC MSMT LEADCHNL RV IMPEDANCE VALUE: 450 Ohm
MDC IDC MSMT LEADCHNL RV PACING THRESHOLD AMPLITUDE: 1.25 V
MDC IDC PG SERIAL: 7192850
MDC IDC SET LEADCHNL RA PACING AMPLITUDE: 2 V
MDC IDC SET LEADCHNL RV PACING PULSEWIDTH: 0.4 ms
MDC IDC SET LEADCHNL RV SENSING SENSITIVITY: 2 mV
MDC IDC STAT BRADY AP VP PERCENT: 1 %
MDC IDC STAT BRADY RV PERCENT PACED: 1 %

## 2014-09-08 NOTE — Progress Notes (Signed)
Remote pacemaker transmission.   

## 2014-09-26 ENCOUNTER — Encounter: Payer: Self-pay | Admitting: Cardiology

## 2014-11-03 ENCOUNTER — Ambulatory Visit (INDEPENDENT_AMBULATORY_CARE_PROVIDER_SITE_OTHER): Payer: Managed Care, Other (non HMO) | Admitting: Family Medicine

## 2014-11-03 ENCOUNTER — Encounter: Payer: Self-pay | Admitting: Family Medicine

## 2014-11-03 VITALS — BP 120/80 | Temp 98.1°F | Ht 66.0 in | Wt 201.4 lb

## 2014-11-03 DIAGNOSIS — J310 Chronic rhinitis: Secondary | ICD-10-CM

## 2014-11-03 DIAGNOSIS — J329 Chronic sinusitis, unspecified: Secondary | ICD-10-CM

## 2014-11-03 MED ORDER — AMOXICILLIN-POT CLAVULANATE 875-125 MG PO TABS: 1.0000 | ORAL_TABLET | Freq: Two times a day (BID) | ORAL | Status: AC

## 2014-11-03 NOTE — Progress Notes (Signed)
   Subjective:    Patient ID: [name], female    DOB: 03-Oct-1961, 53 y.o.   MRN: [id number]  Sinusitis This is a new problem. The current episode started in the past 7 days. The problem has been gradually worsening since onset. There has been no fever. Her pain is at a severity of 8/10. The pain is moderate. Associated symptoms include congestion, coughing, headaches and sinus pressure. Past treatments include acetaminophen. The treatment provided no relief.   Patient states that she has no other concerns at this time.   Nasal cong and stuffiness  Feels miserable  achey   No fever  No energy  Mostly in the sinus area, harder to breather thru the nose,  Used otc tylenol    Review of Systems  HENT: Positive for congestion and sinus pressure.   Respiratory: Positive for cough.   Neurological: Positive for headaches.       Objective:   Physical Exam Alert vitals stable. Moderate malaise. Frontal maxillary tenderness evident. Pharynx normal. Neck supple. Lungs clear. Heart regular in rhythm.       Assessment & Plan:  Impression 1 acute rhinosinusitis plan antibiotics prescribed. Symptomatic care discussed. Warning signs discussed.

## 2014-11-27 ENCOUNTER — Encounter: Payer: Self-pay | Admitting: Nurse Practitioner

## 2014-11-27 ENCOUNTER — Ambulatory Visit (INDEPENDENT_AMBULATORY_CARE_PROVIDER_SITE_OTHER): Payer: Managed Care, Other (non HMO) | Admitting: Nurse Practitioner

## 2014-11-27 VITALS — BP 122/82 | Ht 66.0 in | Wt 201.2 lb

## 2014-11-27 DIAGNOSIS — M545 Low back pain, unspecified: Secondary | ICD-10-CM

## 2014-11-27 MED ORDER — HYDROCODONE-ACETAMINOPHEN 10-325 MG PO TABS: 1.0000 | ORAL_TABLET | Freq: Three times a day (TID) | ORAL | Status: DC | PRN

## 2014-11-27 MED ORDER — NAPROXEN 500 MG PO TABS: 500.0000 mg | ORAL_TABLET | Freq: Two times a day (BID) | ORAL | Status: DC

## 2014-11-27 MED ORDER — METHOCARBAMOL 750 MG PO TABS: 750.0000 mg | ORAL_TABLET | Freq: Three times a day (TID) | ORAL | Status: DC

## 2014-11-30 ENCOUNTER — Encounter: Payer: Self-pay | Admitting: Nurse Practitioner

## 2014-11-30 NOTE — Progress Notes (Signed)
Subjective:  Presents for c/o generalized low back pain. Worse with movement. Began about a week ago. No specific history of injury. Does not radiate. No change in bowel or bladder habits.   Objective:   BP 122/82 mmHg  Ht 5\' 6"  (1.676 m)  Wt 201 lb 4 oz (91.286 kg)  BMI 32.50 kg/m2 NAD. Alert, oriented. Generalized tenderness lower thoracic into lumbar paraspinal area. SLR neg bilat. Reflexes normal lower extremities. Gait slow but steady.  Assessment: Bilateral low back pain without sciatica  Plan:  Meds ordered this encounter  Medications  . naproxen (NAPROSYN) 500 MG tablet    Sig: Take 1 tablet (500 mg total) by mouth 2 (two) times daily with a meal.    Dispense:  30 tablet    Refill:  0    Order Specific Question:  Supervising Provider    Answer:  Mikey Kirschner [[id number]]  . methocarbamol (ROBAXIN) 750 MG tablet    Sig: Take 1 tablet (750 mg total) by mouth 3 (three) times daily. Prn muscle spasms    Dispense:  30 tablet    Refill:  0    Order Specific Question:  Supervising Provider    Answer:  Mikey Kirschner [[id number]]  . HYDROcodone-acetaminophen (NORCO) 10-325 MG per tablet    Sig: Take 1 tablet by mouth every 8 (eight) hours as needed.    Dispense:  30 tablet    Refill:  0    Order Specific Question:  Supervising Provider    Answer:  Mikey Kirschner [[id number]]   TENS unit. Massage. Ice/heat applications. Low back exercises. Call back in 7-10 days if no better, sooner if worse.

## 2014-12-01 ENCOUNTER — Telehealth: Payer: Self-pay

## 2014-12-01 NOTE — Telephone Encounter (Signed)
Patient found out her great niece has this condition. Patient to contact insurance and check with them and call us back if she wants [place] to proceed and order this blood work.

## 2014-12-01 NOTE — Telephone Encounter (Signed)
Pt called wanting to know if she should get tested for the  DNA factor 5 leiden gene. Pt stated that her family suggested she Get checked because it is hereditary?

## 2014-12-10 ENCOUNTER — Encounter: Payer: Self-pay | Admitting: Internal Medicine

## 2014-12-10 ENCOUNTER — Ambulatory Visit (INDEPENDENT_AMBULATORY_CARE_PROVIDER_SITE_OTHER): Payer: Commercial Indemnity | Admitting: *Deleted

## 2014-12-10 DIAGNOSIS — R55 Syncope and collapse: Secondary | ICD-10-CM

## 2014-12-10 DIAGNOSIS — I442 Atrioventricular block, complete: Secondary | ICD-10-CM

## 2014-12-10 LAB — MDC_IDC_ENUM_SESS_TYPE_REMOTE
Battery Voltage: 2.95 V
Brady Statistic AP VP Percent: 1 %
Brady Statistic AS VP Percent: 1 %
Brady Statistic AS VS Percent: 94 %
Brady Statistic RA Percent Paced: 5 %
Brady Statistic RV Percent Paced: 1 %
Date Time Interrogation Session: 20151230083521
Implantable Pulse Generator Model: 2210
Implantable Pulse Generator Serial Number: 7192850
Lead Channel Impedance Value: 480 Ohm
Lead Channel Pacing Threshold Amplitude: 1.25 V
Lead Channel Pacing Threshold Pulse Width: 0.4 ms
Lead Channel Sensing Intrinsic Amplitude: 5 mV
Lead Channel Sensing Intrinsic Amplitude: 5 mV
Lead Channel Setting Pacing Amplitude: 1.5 V
Lead Channel Setting Pacing Amplitude: 2 V
Lead Channel Setting Sensing Sensitivity: 2 mV
MDC IDC MSMT BATTERY REMAINING LONGEVITY: 98 mo
MDC IDC MSMT BATTERY REMAINING PERCENTAGE: 70 %
MDC IDC MSMT LEADCHNL RA IMPEDANCE VALUE: 510 Ohm
MDC IDC MSMT LEADCHNL RA PACING THRESHOLD AMPLITUDE: 1 V
MDC IDC MSMT LEADCHNL RV PACING THRESHOLD PULSEWIDTH: 0.4 ms
MDC IDC SET LEADCHNL RV PACING PULSEWIDTH: 0.4 ms
MDC IDC STAT BRADY AP VS PERCENT: 5.5 %

## 2014-12-10 NOTE — Progress Notes (Signed)
Remote pacemaker transmission.   

## 2014-12-16 ENCOUNTER — Encounter: Payer: Self-pay | Admitting: Cardiology

## 2015-01-15 ENCOUNTER — Other Ambulatory Visit: Payer: Self-pay | Admitting: Family Medicine

## 2015-03-25 ENCOUNTER — Encounter: Payer: Self-pay | Admitting: Internal Medicine

## 2015-03-25 ENCOUNTER — Ambulatory Visit (INDEPENDENT_AMBULATORY_CARE_PROVIDER_SITE_OTHER): Payer: Commercial Indemnity | Admitting: Internal Medicine

## 2015-03-25 VITALS — BP 118/70 | HR 62 | Ht 66.0 in | Wt 208.5 lb

## 2015-03-25 DIAGNOSIS — Z720 Tobacco use: Secondary | ICD-10-CM

## 2015-03-25 DIAGNOSIS — Z950 Presence of cardiac pacemaker: Secondary | ICD-10-CM

## 2015-03-25 DIAGNOSIS — F17200 Nicotine dependence, unspecified, uncomplicated: Secondary | ICD-10-CM

## 2015-03-25 DIAGNOSIS — I442 Atrioventricular block, complete: Secondary | ICD-10-CM | POA: Diagnosis not present

## 2015-03-25 DIAGNOSIS — R55 Syncope and collapse: Secondary | ICD-10-CM

## 2015-03-25 LAB — MDC_IDC_ENUM_SESS_TYPE_INCLINIC
Battery Remaining Longevity: 129.6 mo
Battery Voltage: 2.95 V
Brady Statistic RA Percent Paced: 4 %
Brady Statistic RV Percent Paced: 0.08 %
Lead Channel Impedance Value: 450 Ohm
Lead Channel Impedance Value: 462.5 Ohm
Lead Channel Pacing Threshold Pulse Width: 0.4 ms
Lead Channel Pacing Threshold Pulse Width: 0.4 ms
Lead Channel Sensing Intrinsic Amplitude: 4.3 mV
Lead Channel Setting Pacing Amplitude: 1.5 V
Lead Channel Setting Pacing Amplitude: 2 V
Lead Channel Setting Sensing Sensitivity: 2 mV
MDC IDC MSMT LEADCHNL RA PACING THRESHOLD AMPLITUDE: 1 V
MDC IDC MSMT LEADCHNL RV PACING THRESHOLD AMPLITUDE: 1.25 V
MDC IDC MSMT LEADCHNL RV SENSING INTR AMPL: 4.7 mV
MDC IDC PG SERIAL: 7192850
MDC IDC SESS DTM: 20160413115230
MDC IDC SET LEADCHNL RV PACING PULSEWIDTH: 0.4 ms

## 2015-03-25 NOTE — Assessment & Plan Note (Signed)
She has had no recurrent episodes since her PPM was placed.

## 2015-03-25 NOTE — Patient Instructions (Signed)
Your physician wants you to follow-up in: 1 year with Dr. Lovena Le. You will receive a reminder letter in the mail two months in advance. If you don't receive a letter, please call our office to schedule the follow-up appointment.  Your physician recommends that you continue on your current medications as directed. Please refer to the Current Medication list given to you today.  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 This monitoring reduces the number of office visits required to check your device to one time per year. It allows [place] to keep an eye on the functioning of your device to ensure it is working properly. You are scheduled for a device check from home on 06/24/15. You may send your transmission at any time that day. If you have a wireless device, the transmission will be sent automatically. After your physician reviews your transmission, you will receive a postcard with your next transmission date.  Thank you for choosing [facility]!

## 2015-03-25 NOTE — Assessment & Plan Note (Signed)
Her St. Jude DDD PM is working normally. Will recheck in several months.  

## 2015-03-25 NOTE — Assessment & Plan Note (Signed)
We discussed the importance of stopping smoking. She states that she will try.

## 2015-03-25 NOTE — Progress Notes (Signed)
HPI [name] returns today for followup. She is a very pleasant middle-age woman with symptomatic bradycardia, status post permanent pacemaker insertion, tobacco abuse, and gastroesophageal reflux disease. She denies chest pain or shortness of breath. No peripheral edema. She has reduced her tobacco consumption from 2 ppd to 1/2 ppd. She plans to try and stop smoking by using electronic cigarettes and gradually reducing the amount of nicotine. Allergies  Allergen Reactions  . Betadine [Povidone Iodine]     Blisters   . Codeine   . Povidone-Iodine   . Sulfonamide Derivatives   . Vioxx [Rofecoxib] Rash     Current Outpatient Prescriptions  Medication Sig Dispense Refill  . aspirin 81 MG tablet Take 162 mg by mouth daily.    . Cholecalciferol (VITAMIN D3) 1000 UNITS tablet Take 1,000 Units by mouth daily.      . fluticasone (FLONASE) 50 MCG/ACT nasal spray Place 1 spray into both nostrils daily. 16 g 5  . levothyroxine (SYNTHROID, LEVOTHROID) 50 MCG tablet TAKE ONE TABLET BY MOUTH ONCE DAILY 30 tablet 11  . omeprazole (PRILOSEC) 20 MG capsule TAKE ONE CAPSULE BY MOUTH TWICE DAILY 60 capsule 5   No current facility-administered medications for this visit.     Past Medical History  Diagnosis Date  . IBS (irritable bowel syndrome)   . Anxiety   . Depression     bipolar disorder  . GERD (gastroesophageal reflux disease)   . HLD (hyperlipidemia)   . Hypothyroidism   . OSA (obstructive sleep apnea)   . Heart defect   . Arthritis   . Esophageal stricture   . Reflux   . IFG (impaired fasting glucose)   . Migraine headache     ROS:   All systems reviewed and negative except as noted in the HPI.   Past Surgical History  Procedure Laterality Date  . Cesarean sections      x2  . Tubal ligation    . Abdominal hernia repair    . Vaginal hysterectomy    . Cholecystectomy    . Foot surgery      screws in left foot  . Breast cyst excision      left  . Dental implants    .  Pacemaker insertion       Family History  Problem Relation Age of Onset  . Throat cancer Father     squamous cell carcinoma  . Tongue cancer Father   . Esophageal cancer Father   . Arthritis Mother   . Heart disease Mother     coronary; female < 76  . Asthma Mother      History   Social History  . Marital Status: Married    Spouse Name: N/A  . Number of Children: 2  . Years of Education: N/A   Occupational History  . retired    Social History Main Topics  . Smoking status: Current Every Day Smoker -- 1.00 packs/day    Types: Cigarettes  . Smokeless tobacco: Never Used  . Alcohol Use: No     Comment: Occ.   . Drug Use: No  . Sexual Activity: Not on file   Other Topics Concern  . Not on file   Social History Narrative   Unemployed. 10 cans of soda per day.      BP 118/70 mmHg  Pulse 62  Ht 5\' 6"  (1.676 m)  Wt 208 lb 8 oz (94.575 kg)  BMI 33.67 kg/m2  Physical Exam:  Well appearing middle-age woman,NAD  HEENT: Unremarkable Neck:  7 cm JVD, no thyromegally Lungs:  Clear with no wheezes, rales, or rhonchi. Well-healed pacemaker incision. HEART:  Regular rate rhythm, no murmurs, no rubs, no clicks Abd:  soft, positive bowel sounds, no organomegally, no rebound, no guarding Ext:  2 plus pulses, no edema, no cyanosis, no clubbing Skin:  No rashes no nodules Neuro:  CN II through XII intact, motor grossly intact  DEVICE  Normal device function.  See PaceArt for details.   Assess/Plan:

## 2015-03-30 ENCOUNTER — Ambulatory Visit (INDEPENDENT_AMBULATORY_CARE_PROVIDER_SITE_OTHER): Payer: Managed Care, Other (non HMO) | Admitting: Family Medicine

## 2015-03-30 ENCOUNTER — Encounter: Payer: Self-pay | Admitting: Family Medicine

## 2015-03-30 VITALS — BP 130/88 | Temp 98.8°F | Ht 66.0 in | Wt 204.5 lb

## 2015-03-30 DIAGNOSIS — J329 Chronic sinusitis, unspecified: Secondary | ICD-10-CM

## 2015-03-30 DIAGNOSIS — J310 Chronic rhinitis: Secondary | ICD-10-CM

## 2015-03-30 MED ORDER — HYDROCODONE-HOMATROPINE 5-1.5 MG/5ML PO SYRP
5.0000 mL | ORAL_SOLUTION | Freq: Every evening | ORAL | Status: DC | PRN
Start: 2015-03-30 — End: 2015-06-24

## 2015-03-30 MED ORDER — LEVOFLOXACIN 500 MG PO TABS: 500.0000 mg | ORAL_TABLET | Freq: Every day | ORAL | Status: DC

## 2015-03-30 NOTE — Progress Notes (Signed)
   Subjective:    Patient ID: [name], female    DOB: Jan 11, 1961, 54 y.o.   MRN: [id number]  Sinusitis This is a new problem. The current episode started 1 to 4 weeks ago. The problem is unchanged. There has been no fever. The pain is moderate. Associated symptoms include congestion, coughing, ear pain, headaches, sinus pressure and a sore throat. Treatments tried: otc allergy medicines. The treatment provided no relief.   Patient states she has no other concerns at this time.  Pt exposed to cong and d cold weather A lot of frontal headache and c ong and stuffiness   Review of Systems  HENT: Positive for congestion, ear pain, sinus pressure and sore throat.   Respiratory: Positive for cough.   Neurological: Positive for headaches.       Objective:   Physical Exam Alert moderate malaise vital stable. Vitals stable hydration good frontal max or tenderness evident with bronchial cough. No acute wheezes heart regular in rhythm.       Assessment & Plan:  Impression post viral rhinosinusitis/bronchitis plan antibiotics prescribed. Symptom care discussed. Hycodan daily at bedtime when necessary. WSL

## 2015-04-01 ENCOUNTER — Encounter: Payer: Commercial Indemnity | Admitting: Internal Medicine

## 2015-04-21 ENCOUNTER — Encounter: Payer: Self-pay | Admitting: Internal Medicine

## 2015-06-24 ENCOUNTER — Ambulatory Visit (INDEPENDENT_AMBULATORY_CARE_PROVIDER_SITE_OTHER): Payer: Managed Care, Other (non HMO) | Admitting: Family Medicine

## 2015-06-24 ENCOUNTER — Encounter: Payer: Self-pay | Admitting: Family Medicine

## 2015-06-24 ENCOUNTER — Ambulatory Visit (INDEPENDENT_AMBULATORY_CARE_PROVIDER_SITE_OTHER): Payer: Commercial Indemnity | Admitting: *Deleted

## 2015-06-24 VITALS — BP 130/88 | Temp 97.3°F | Ht 66.0 in | Wt 206.2 lb

## 2015-06-24 DIAGNOSIS — J329 Chronic sinusitis, unspecified: Secondary | ICD-10-CM | POA: Diagnosis not present

## 2015-06-24 DIAGNOSIS — I442 Atrioventricular block, complete: Secondary | ICD-10-CM

## 2015-06-24 DIAGNOSIS — J310 Chronic rhinitis: Secondary | ICD-10-CM

## 2015-06-24 MED ORDER — LEVOFLOXACIN 500 MG PO TABS: 500.0000 mg | ORAL_TABLET | Freq: Every day | ORAL | Status: AC

## 2015-06-24 NOTE — Progress Notes (Signed)
Remote pacemaker transmission.   

## 2015-06-24 NOTE — Progress Notes (Signed)
   Subjective:    Patient ID: [name], female    DOB: 10-Jan-1961, 54 y.o.   MRN: [id number]  Sinusitis This is a new problem. The current episode started in the past 7 days. The problem is unchanged. There has been no fever. Her pain is at a severity of 8/10. The pain is moderate. Associated symptoms include congestion, coughing, headaches, a sore throat and swollen glands. Past treatments include nothing. The treatment provided no relief.   Patient has no other concerns at this time.   Congested last several days  troat very sore   Using prn symotins  Dinm energy   Did well overall   Stopped up but not pressure headache  Coughing an d cong in the chest Review of Systems  HENT: Positive for congestion and sore throat.   Respiratory: Positive for cough.   Neurological: Positive for headaches.       Objective:   Physical Exam  Alert vitals stable HEENT moderate his congestion frontal tenderness pharynx normal neck supple. Lungs clear intermittent bronchial cough heart regular in rhythm.      Assessment & Plan:  Impression rhinosinusitis/bronchitis plan anti-biotics prescribed. Symptom care discussed encouraged to stop smoking WSL

## 2015-06-30 LAB — CUP PACEART REMOTE DEVICE CHECK
Battery Remaining Percentage: 91 %
Battery Voltage: 2.95 V
Brady Statistic AP VP Percent: 1 %
Brady Statistic AP VS Percent: 1.2 %
Brady Statistic AS VP Percent: 1 %
Brady Statistic RV Percent Paced: 1 %
Date Time Interrogation Session: 20160713074052
Lead Channel Impedance Value: 410 Ohm
Lead Channel Impedance Value: 460 Ohm
Lead Channel Pacing Threshold Amplitude: 0.875 V
Lead Channel Pacing Threshold Pulse Width: 0.4 ms
Lead Channel Sensing Intrinsic Amplitude: 5 mV
Lead Channel Sensing Intrinsic Amplitude: 5.4 mV
MDC IDC MSMT BATTERY REMAINING LONGEVITY: 124 mo
MDC IDC MSMT LEADCHNL RA PACING THRESHOLD PULSEWIDTH: 0.4 ms
MDC IDC MSMT LEADCHNL RV PACING THRESHOLD AMPLITUDE: 1.375 V
MDC IDC SET LEADCHNL RA PACING AMPLITUDE: 1.875
MDC IDC SET LEADCHNL RV PACING AMPLITUDE: 1.625
MDC IDC SET LEADCHNL RV PACING PULSEWIDTH: 0.4 ms
MDC IDC SET LEADCHNL RV SENSING SENSITIVITY: 2 mV
MDC IDC STAT BRADY AS VS PERCENT: 99 %
MDC IDC STAT BRADY RA PERCENT PACED: 1.1 %
Pulse Gen Model: 2210
Pulse Gen Serial Number: 7192850

## 2015-07-17 ENCOUNTER — Encounter: Payer: Self-pay | Admitting: Cardiology

## 2015-07-22 ENCOUNTER — Encounter: Payer: Self-pay | Admitting: Internal Medicine

## 2015-07-23 ENCOUNTER — Other Ambulatory Visit: Payer: Self-pay | Admitting: *Deleted

## 2015-07-23 MED ORDER — LEVOTHYROXINE SODIUM 50 MCG PO TABS: ORAL_TABLET | ORAL | Status: DC

## 2015-07-23 MED ORDER — OMEPRAZOLE 20 MG PO CPDR: 20.0000 mg | DELAYED_RELEASE_CAPSULE | Freq: Two times a day (BID) | ORAL | Status: DC

## 2015-09-24 ENCOUNTER — Ambulatory Visit (INDEPENDENT_AMBULATORY_CARE_PROVIDER_SITE_OTHER): Payer: Commercial Indemnity | Admitting: *Deleted

## 2015-09-24 DIAGNOSIS — I442 Atrioventricular block, complete: Secondary | ICD-10-CM

## 2015-09-24 NOTE — Progress Notes (Signed)
Remote pacemaker transmission.   

## 2015-09-25 LAB — CUP PACEART REMOTE DEVICE CHECK
Battery Remaining Longevity: 124 mo
Battery Voltage: 2.95 V
Brady Statistic AP VS Percent: 1.6 %
Brady Statistic AS VP Percent: 1 %
Brady Statistic RA Percent Paced: 1.5 %
Date Time Interrogation Session: 20161013061019
Implantable Lead Implant Date: 20111130
Implantable Lead Location: 753860
Lead Channel Impedance Value: 430 Ohm
Lead Channel Pacing Threshold Amplitude: 1.25 V
Lead Channel Pacing Threshold Pulse Width: 0.4 ms
Lead Channel Pacing Threshold Pulse Width: 0.4 ms
Lead Channel Sensing Intrinsic Amplitude: 4.6 mV
Lead Channel Setting Pacing Amplitude: 1.5 V
Lead Channel Setting Pacing Amplitude: 1.875
Lead Channel Setting Pacing Pulse Width: 0.4 ms
Lead Channel Setting Sensing Sensitivity: 2 mV
MDC IDC LEAD IMPLANT DT: 20111130
MDC IDC LEAD LOCATION: 753859
MDC IDC MSMT BATTERY REMAINING PERCENTAGE: 91 %
MDC IDC MSMT LEADCHNL RA PACING THRESHOLD AMPLITUDE: 0.875 V
MDC IDC MSMT LEADCHNL RA SENSING INTR AMPL: 3.5 mV
MDC IDC MSMT LEADCHNL RV IMPEDANCE VALUE: 460 Ohm
MDC IDC STAT BRADY AP VP PERCENT: 1 %
MDC IDC STAT BRADY AS VS PERCENT: 98 %
MDC IDC STAT BRADY RV PERCENT PACED: 1 %
Pulse Gen Model: 2210
Pulse Gen Serial Number: 7192850

## 2015-09-29 ENCOUNTER — Encounter: Payer: Self-pay | Admitting: Cardiology

## 2015-10-17 ENCOUNTER — Other Ambulatory Visit: Payer: Self-pay | Admitting: Family Medicine

## 2015-10-19 ENCOUNTER — Other Ambulatory Visit: Payer: Self-pay | Admitting: *Deleted

## 2015-10-19 MED ORDER — LEVOTHYROXINE SODIUM 50 MCG PO TABS: ORAL_TABLET | ORAL | Status: DC

## 2015-11-12 ENCOUNTER — Ambulatory Visit (INDEPENDENT_AMBULATORY_CARE_PROVIDER_SITE_OTHER): Payer: Managed Care, Other (non HMO) | Admitting: Family Medicine

## 2015-11-12 VITALS — Temp 98.6°F | Ht 66.0 in | Wt 208.4 lb

## 2015-11-12 DIAGNOSIS — E785 Hyperlipidemia, unspecified: Secondary | ICD-10-CM

## 2015-11-12 DIAGNOSIS — J019 Acute sinusitis, unspecified: Secondary | ICD-10-CM

## 2015-11-12 DIAGNOSIS — B338 Other specified viral diseases: Secondary | ICD-10-CM

## 2015-11-12 DIAGNOSIS — B9689 Other specified bacterial agents as the cause of diseases classified elsewhere: Secondary | ICD-10-CM

## 2015-11-12 DIAGNOSIS — E039 Hypothyroidism, unspecified: Secondary | ICD-10-CM

## 2015-11-12 DIAGNOSIS — B348 Other viral infections of unspecified site: Secondary | ICD-10-CM

## 2015-11-12 MED ORDER — CEFPROZIL 500 MG PO TABS: 500.0000 mg | ORAL_TABLET | Freq: Two times a day (BID) | ORAL | Status: DC

## 2015-11-12 MED ORDER — FLUTICASONE PROPIONATE 50 MCG/ACT NA SUSP: 1.0000 | Freq: Every day | NASAL | Status: DC

## 2015-11-12 MED ORDER — LEVOTHYROXINE SODIUM 50 MCG PO TABS: ORAL_TABLET | ORAL | Status: DC

## 2015-11-12 NOTE — Progress Notes (Signed)
   Subjective:    Patient ID: [name], female    DOB: 1961-08-29, 54 y.o.   MRN: [id number]  Cough This is a new problem. The current episode started in the past 7 days. Associated symptoms include ear pain, headaches, nasal congestion and a sore throat. She has tried OTC cough suppressant for the symptoms.   Patient with head congestion drainage coughing sinus pressure present over the past several days no prior trouble with this tried OTC measures without success moderate sinus tenderness and pain   Review of Systems  HENT: Positive for ear pain and sore throat.   Respiratory: Positive for cough.   Neurological: Positive for headaches.       Objective:   Physical Exam  Constitutional: She appears well-developed.  HENT:  Head: Normocephalic.  Nose: Nose normal.  Mouth/Throat: Oropharynx is clear and moist. No oropharyngeal exudate.  Neck: Neck supple.  Cardiovascular: Normal rate and normal heart sounds.   No murmur heard. Pulmonary/Chest: Effort normal and breath sounds normal. She has no wheezes.  Lymphadenopathy:    She has no cervical adenopathy.  Skin: Skin is warm and dry.  Nursing note and vitals reviewed.         Assessment & Plan:  Viral syndrome Secondary rhinosinusitis Antibiotics prescribed warning signs discussed follow-up of problems  Patient on thyroid medicine she ran out gave her refill she needs follow-up with Dr. Richardson Landry within 90 days refills and lab testing today

## 2015-11-14 LAB — TSH: TSH: 4.8 u[IU]/mL — ABNORMAL HIGH (ref 0.450–4.500)

## 2015-11-14 LAB — LIPID PANEL
CHOLESTEROL TOTAL: 211 mg/dL — AB (ref 100–199)
Chol/HDL Ratio: 5.7 ratio units — ABNORMAL HIGH (ref 0.0–4.4)
HDL: 37 mg/dL — AB (ref >39)
LDL CALC: 140 mg/dL — AB (ref 0–99)
Triglycerides: 170 mg/dL — ABNORMAL HIGH (ref 0–149)
VLDL CHOLESTEROL CAL: 34 mg/dL (ref 5–40)

## 2015-11-14 LAB — BASIC METABOLIC PANEL
BUN / CREAT RATIO: 7 — AB (ref 9–23)
BUN: 6 mg/dL (ref 6–24)
CO2: 22 mmol/L (ref 18–29)
CREATININE: 0.9 mg/dL (ref 0.57–1.00)
Calcium: 9.4 mg/dL (ref 8.7–10.2)
Chloride: 101 mmol/L (ref 97–106)
GFR, EST AFRICAN AMERICAN: 85 mL/min/{1.73_m2} (ref >59)
GFR, EST NON AFRICAN AMERICAN: 74 mL/min/{1.73_m2} (ref >59)
Glucose: 69 mg/dL (ref 65–99)
POTASSIUM: 3.6 mmol/L (ref 3.5–5.2)
SODIUM: 141 mmol/L (ref 136–144)

## 2015-11-15 ENCOUNTER — Encounter: Payer: Self-pay | Admitting: Family Medicine

## 2015-12-04 ENCOUNTER — Encounter: Payer: Self-pay | Admitting: Family Medicine

## 2015-12-04 ENCOUNTER — Ambulatory Visit (INDEPENDENT_AMBULATORY_CARE_PROVIDER_SITE_OTHER): Payer: Managed Care, Other (non HMO) | Admitting: Family Medicine

## 2015-12-04 VITALS — BP 110/78 | Ht 66.0 in | Wt 205.0 lb

## 2015-12-04 DIAGNOSIS — E039 Hypothyroidism, unspecified: Secondary | ICD-10-CM | POA: Diagnosis not present

## 2015-12-04 DIAGNOSIS — Z23 Encounter for immunization: Secondary | ICD-10-CM

## 2015-12-04 DIAGNOSIS — J019 Acute sinusitis, unspecified: Secondary | ICD-10-CM

## 2015-12-04 DIAGNOSIS — B9689 Other specified bacterial agents as the cause of diseases classified elsewhere: Secondary | ICD-10-CM

## 2015-12-04 MED ORDER — LEVOTHYROXINE SODIUM 75 MCG PO TABS: ORAL_TABLET | ORAL | Status: DC

## 2015-12-04 NOTE — Progress Notes (Signed)
   Subjective:    Patient ID: [name], female    DOB: [birth year], 54 y.o.   MRN: [id number]  HPIMed check up.Hypothyroidism. Taking levothyroxine 50 mcg. claims compliance with medications. No symptoms of high or low thyroid. Trying to watch diet. Does not miss a dose. Meds reviewed today.  History of elevated cholesterol discussed.   Pt states no concerns or problems.  Flu vaccine.     Review of Systems No headache no chest pain no back pain abdominal pain no change in bowel habits ROS otherwise negative    Objective:   Physical Exam  Alert vitals stable. HEENT normal. Lungs clear. Heart regular rhythm thyroid nonpalpable      Assessment & Plan:  Impression hypothyroidism blood work discussed TSH elevated #2 hyperlipidemia discussed plan diet exercise discussed increase thyroid. Symptom care discussed flu shot WSL

## 2015-12-24 ENCOUNTER — Ambulatory Visit (INDEPENDENT_AMBULATORY_CARE_PROVIDER_SITE_OTHER): Payer: Managed Care, Other (non HMO) | Admitting: *Deleted

## 2015-12-24 DIAGNOSIS — I442 Atrioventricular block, complete: Secondary | ICD-10-CM | POA: Diagnosis not present

## 2015-12-24 NOTE — Progress Notes (Signed)
Remote pacemaker transmission.   

## 2016-01-05 LAB — CUP PACEART REMOTE DEVICE CHECK
Battery Remaining Percentage: 91 %
Battery Voltage: 2.95 V
Brady Statistic AS VS Percent: 98 %
Brady Statistic RA Percent Paced: 1.6 %
Brady Statistic RV Percent Paced: 1 %
Date Time Interrogation Session: 20170112123938
Implantable Lead Implant Date: 20111130
Lead Channel Impedance Value: 400 Ohm
Lead Channel Pacing Threshold Pulse Width: 0.4 ms
Lead Channel Setting Pacing Amplitude: 1.625
Lead Channel Setting Pacing Pulse Width: 0.4 ms
MDC IDC LEAD IMPLANT DT: 20111130
MDC IDC LEAD LOCATION: 753859
MDC IDC LEAD LOCATION: 753860
MDC IDC MSMT BATTERY REMAINING LONGEVITY: 124 mo
MDC IDC MSMT LEADCHNL RV IMPEDANCE VALUE: 490 Ohm
MDC IDC MSMT LEADCHNL RV PACING THRESHOLD AMPLITUDE: 1.375 V
MDC IDC PG SERIAL: 7192850
MDC IDC SET LEADCHNL RA PACING AMPLITUDE: 2 V
MDC IDC SET LEADCHNL RV SENSING SENSITIVITY: 2 mV
MDC IDC STAT BRADY AP VP PERCENT: 1 %
MDC IDC STAT BRADY AP VS PERCENT: 1.7 %
MDC IDC STAT BRADY AS VP PERCENT: 1 %
Pulse Gen Model: 2210

## 2016-01-08 ENCOUNTER — Encounter: Payer: Self-pay | Admitting: Cardiology

## 2016-01-22 ENCOUNTER — Encounter: Payer: Self-pay | Admitting: Cardiology

## 2016-03-02 ENCOUNTER — Encounter: Payer: Self-pay | Admitting: Family Medicine

## 2016-03-02 ENCOUNTER — Ambulatory Visit (INDEPENDENT_AMBULATORY_CARE_PROVIDER_SITE_OTHER): Payer: Managed Care, Other (non HMO) | Admitting: Family Medicine

## 2016-03-02 VITALS — BP 120/80 | Temp 98.7°F | Ht 66.0 in | Wt 203.2 lb

## 2016-03-02 DIAGNOSIS — K625 Hemorrhage of anus and rectum: Secondary | ICD-10-CM

## 2016-03-02 DIAGNOSIS — K921 Melena: Secondary | ICD-10-CM

## 2016-03-02 LAB — POCT HEMOGLOBIN: Hemoglobin: 16.5 g/dL — AB (ref 12.2–16.2)

## 2016-03-02 NOTE — Progress Notes (Signed)
   Subjective:    Patient ID: [name], female    DOB: Dec 20, 1960, 55 y.o.   MRN: [id number] Patient arrives office with acute concerns Rectal Bleeding  The current episode started 5 to 7 days ago. The onset was sudden. The problem has been unchanged. The patient is experiencing no pain. The stool is described as soft. She has been behaving normally. She has been eating and drinking normally.   Results for orders placed or performed in visit on 03/02/16  POCT hemoglobin  Result Value Ref Range   Hemoglobin 16.5 (A) 12.2 - 16.2 g/dL   Colon neg while bk . Several years ago.  No sig hard stools   Patient is seen blood in stool off-and-on for the past week. No abdominal pain no fever no chills. Next  No known history of diverticulosis or hemorrhoids X  No recent challenges with constipation. Next  Was somewhat alarmed today when saw a large couple inch clot of blood emerged during a bowel movement  [phone]   Review of Systems  Gastrointestinal: Positive for hematochezia.   See above    Objective:   Physical Exam  Alert vital stable lungs clear heart rhythm HET normal abdomen benign rectal exam positive stool trace heme positive negative hemorrhoids      Assessment & Plan:  Impression  Hematochezia discussed at length plan warning signs discussed length. Time to get back to the GI physician. Symptom care discussed potential etiologies discussed WSL 25 minutes spent in discussion

## 2016-03-04 ENCOUNTER — Encounter: Payer: Self-pay | Admitting: Family Medicine

## 2016-03-08 ENCOUNTER — Ambulatory Visit (INDEPENDENT_AMBULATORY_CARE_PROVIDER_SITE_OTHER): Payer: Managed Care, Other (non HMO) | Admitting: Gastroenterology

## 2016-03-08 ENCOUNTER — Encounter: Payer: Self-pay | Admitting: Gastroenterology

## 2016-03-08 VITALS — BP 140/70 | HR 70 | Ht 66.0 in | Wt 205.0 lb

## 2016-03-08 DIAGNOSIS — K625 Hemorrhage of anus and rectum: Secondary | ICD-10-CM | POA: Diagnosis not present

## 2016-03-08 DIAGNOSIS — R140 Abdominal distension (gaseous): Secondary | ICD-10-CM

## 2016-03-08 DIAGNOSIS — K649 Unspecified hemorrhoids: Secondary | ICD-10-CM | POA: Diagnosis not present

## 2016-03-08 MED ORDER — NA SULFATE-K SULFATE-MG SULF 17.5-3.13-1.6 GM/177ML PO SOLN: 1.0000 | Freq: Once | ORAL | Status: DC

## 2016-03-08 NOTE — Progress Notes (Signed)
 HPI :  55 y/o female with a history of IBS and history of symptomatic bradycardia requiring pacemaker placement, former patient of Dr. Kaplan, here for reassessment, although she has not been seen since 2013.  Patient here with rectal bleeding which has been bothering her. She had some slight blood on the toiliet paper with wiping which had been ongoing for some time. Last week, however, after a bowel movement she passed a "handful" of blood with clots in it. She has had 3 or 4 bowel movements since then that have appeared normal. Hgb checked and normal.   She has variable stool habits, sometimes hard and constipated stools, sometimes soft. She has occasional straining. She has a bowel movement every other day. She denies any perianal pain. She has never had bleeding like this before. She is not aware of a prior history of hemorrhoids. She reports chronic "tenderness" in her abdomen, in her lower abdomen. No weight loss. Eating okay. She thinks most of her stools are soft, but can pass hard stools and sometimes loose stools.  No FH of colon cancer known. Her last colonoscopy was in 2012 in which she had a benign polyp removed, otherwise normal.  She has some ongoing bloating intermittently which can bother her, not sure if it is related to some of the foods she is eating.  Endoscopic history: Colonoscopy 10/27/11 - one benign polyp, nonadenomatous, otherwise normal EGD 10/27/11 - slight stricture at the GEJ, dilated to 18mm Savory  Past Medical History  Diagnosis Date  . IBS (irritable bowel syndrome)   . Anxiety   . Depression     bipolar disorder  . GERD (gastroesophageal reflux disease)   . HLD (hyperlipidemia)   . Hypothyroidism   . OSA (obstructive sleep apnea)   . Heart defect   . Arthritis   . Esophageal stricture   . Reflux   . IFG (impaired fasting glucose)   . Migraine headache   . Symptomatic bradycardia     Past Surgical History  Procedure Laterality Date  . Cesarean  sections      x2  . Tubal ligation    . Abdominal hernia repair    . Vaginal hysterectomy    . Cholecystectomy    . Foot surgery      screws in left foot  . Breast cyst excision      left  . Dental implants    . Pacemaker insertion     Family History  Problem Relation Age of Onset  . Throat cancer Father     squamous cell carcinoma  . Tongue cancer Father   . Esophageal cancer Father   . Arthritis Mother   . Heart disease Mother     coronary; female < 55  . Asthma Mother   . Colon cancer Neg Hx    Social History  Substance Use Topics  . Smoking status: Current Every Day Smoker -- 1.00 packs/day for 25 years    Types: Cigarettes  . Smokeless tobacco: Never Used     Comment: tobacco info givin 03/08/16  . Alcohol Use: No     Comment: Occ.    Current Outpatient Prescriptions  Medication Sig Dispense Refill  . aspirin 81 MG tablet Take 162 mg by mouth daily.    . Cholecalciferol (VITAMIN D3) 1000 UNITS tablet Take 1,000 Units by mouth daily.      . fluticasone (FLONASE) 50 MCG/ACT nasal spray Place 1 spray into both nostrils daily. 16 g 5  .   levothyroxine (SYNTHROID, LEVOTHROID) 75 MCG tablet TAKE ONE TABLET BY MOUTH ONCE DAILY 30 tablet 11  . omeprazole (PRILOSEC) 20 MG capsule Take 1 capsule (20 mg total) by mouth 2 (two) times daily. 60 capsule 0  . Na Sulfate-K Sulfate-Mg Sulf (SUPREP BOWEL PREP) SOLN Take 1 kit by mouth once. 1 Bottle 0   No current facility-administered medications for this visit.   Allergies  Allergen Reactions  . Betadine [Povidone Iodine]     Blisters   . Codeine   . Povidone-Iodine   . Sulfonamide Derivatives   . Vioxx [Rofecoxib] Rash     Review of Systems: All systems reviewed and negative except where noted in HPI.   Lab Results  Component Value Date   WBC 6.8 07/04/2014   HGB 16.5* 03/02/2016   HCT 44.1 07/04/2014   MCV 85.0 07/04/2014   PLT 413* 07/04/2014    Lab Results  Component Value Date   ALT 11 07/04/2014   AST 14  07/04/2014   ALKPHOS 86 07/04/2014   BILITOT 0.5 07/04/2014    Lab Results  Component Value Date   CREATININE 0.90 11/13/2015   BUN 6 11/13/2015   NA 141 11/13/2015   K 3.6 11/13/2015   CL 101 11/13/2015   CO2 22 11/13/2015     Physical Exam: BP 140/70 mmHg  Pulse 70  Ht 5' 6" (1.676 m)  Wt 205 lb (92.987 kg)  BMI 33.10 kg/m2 Constitutional: Pleasant,well-developed,female in no acute distress. HEENT: Normocephalic and atraumatic. Conjunctivae are normal. No scleral icterus. Neck supple.  Cardiovascular: Normal rate, regular rhythm.  Pulmonary/chest: Effort normal and breath sounds normal. No wheezing, rales or rhonchi. Abdominal: Soft, nondistended, nontender. Bowel sounds active throughout. There are no masses palpable. No hepatomegaly. DRE / Anoscopy - moderate hemorrhoid in the RP position, otherwise normal DRE, no fissure, no mass Extremities: no edema Lymphadenopathy: No cervical adenopathy noted. Neurological: Alert and oriented to person place and time. Skin: Skin is warm and dry. No rashes noted. Psychiatric: Normal mood and affect. Behavior is normal.   ASSESSMENT AND PLAN: 55 y/o female with PMH as outlined above who presents with intermittent scant rectal bleeding, and had an episode of overt frank hematochezia as outlined above, with a normal Hgb. She has some intermittent constipation, and anoscopy shows internal hemorrhoids which I suspect is the likely etiology. However, I offered her a colonoscopy given her last exam was 5 years ago to ensure no interval colon polyp / mass lesion, and for piece of mind. She wished to have the colonoscopy done to evaluate this issue and for piece of mind, following a full discussion of the risks / benefits of the procedure and anesthesia. In the interim I recommend a daily fiber supplement to treat hemorrhoids and constipation, and in light of her bloating would use Citrucel. I otherwise discussed a low FODMOP diet with her and she  will try this initially to treat her bloating. If no improvement she can follow up for reassessment.   Steven Armbruster, MD [facility] Pager [phone]     

## 2016-03-08 NOTE — Patient Instructions (Signed)
You have been scheduled for a colonoscopy. Please follow written instructions given to you at your visit today.  Please pick up your prep supplies at the pharmacy within the next 1-3 days. If you use inhalers (even only as needed), please bring them with you on the day of your procedure. Your physician has requested that you go to www.startemmi.com and enter the access code given to you at your visit today. This web site gives a general overview about your procedure. However, you should still follow specific instructions given to you by our office regarding your preparation for the procedure.  Use a fiber supplement daily  Low FODMAP Diet given

## 2016-03-31 ENCOUNTER — Ambulatory Visit (AMBULATORY_SURGERY_CENTER): Payer: Managed Care, Other (non HMO) | Admitting: Gastroenterology

## 2016-03-31 ENCOUNTER — Encounter: Payer: Self-pay | Admitting: Gastroenterology

## 2016-03-31 VITALS — BP 106/73 | HR 62 | Temp 98.4°F | Resp 18 | Ht 66.0 in | Wt 205.0 lb

## 2016-03-31 DIAGNOSIS — D122 Benign neoplasm of ascending colon: Secondary | ICD-10-CM

## 2016-03-31 DIAGNOSIS — D123 Benign neoplasm of transverse colon: Secondary | ICD-10-CM | POA: Diagnosis not present

## 2016-03-31 DIAGNOSIS — D129 Benign neoplasm of anus and anal canal: Secondary | ICD-10-CM

## 2016-03-31 DIAGNOSIS — K625 Hemorrhage of anus and rectum: Secondary | ICD-10-CM | POA: Diagnosis present

## 2016-03-31 DIAGNOSIS — D128 Benign neoplasm of rectum: Secondary | ICD-10-CM

## 2016-03-31 MED ORDER — SODIUM CHLORIDE 0.9 % IV SOLN: 500.0000 mL | INTRAVENOUS | Status: DC

## 2016-03-31 NOTE — Op Note (Signed)
[facility] Patient Name: [name] Procedure Date: 03/31/2016 2:56 PM MRN: [id number] Endoscopist: Remo Lipps P. Havery Moros , MD Age: 55 Date of Birth: [birth year] Gender: Female Procedure:                Colonoscopy Indications:              Hematochezia Medicines:                Monitored Anesthesia Care Procedure:                Pre-Anesthesia Assessment:                           - Prior to the procedure, a History and Physical                            was performed, and patient medications and                            allergies were reviewed. The patient's tolerance of                            previous anesthesia was also reviewed. The risks                            and benefits of the procedure and the sedation                            options and risks were discussed with the patient.                            All questions were answered, and informed consent                            was obtained. Prior Anticoagulants: The patient has                            taken aspirin, last dose was 1 day prior to                            procedure. ASA Grade Assessment: III - A patient                            with severe systemic disease. After reviewing the                            risks and benefits, the patient was deemed in                            satisfactory condition to undergo the procedure.                           After obtaining informed consent, the colonoscope  was passed under direct vision. Throughout the                            procedure, the patient's blood pressure, pulse, and                            oxygen saturations were monitored continuously. The                            Model CF-HQ190L [id number]) scope was introduced                            through the anus and advanced to the the cecum,                            identified by appendiceal orifice and ileocecal   valve. The colonoscopy was performed without                            difficulty. The patient tolerated the procedure                            well. The quality of the bowel preparation was                            adequate. The ileocecal valve, appendiceal orifice,                            and rectum were photographed. Scope In: 3:04:18 PM Scope Out: 3:25:40 PM Scope Withdrawal Time: 0 hours 18 minutes 51 seconds  Total Procedure Duration: 0 hours 21 minutes 22 seconds  Findings:                 The perianal and digital rectal examinations were                            normal.                           A 4 mm polyp was found in the ascending colon. The                            polyp was sessile. The polyp was removed with a                            cold snare. Resection and retrieval were complete.                           Two sessile polyps were found in the transverse                            colon. The polyps were 4 to 5 mm in size. These  polyps were removed with a cold snare. Resection                            and retrieval were complete.                           Three sessile polyps were found in the rectum. The                            polyps were 3 mm in size. These polyps were removed                            with a cold biopsy forceps. Resection and retrieval                            were complete.                           Non-bleeding internal hemorrhoids were found during                            retroflexion.                           The exam was otherwise normal throughout the                            examined colon. Complications:            No immediate complications. Estimated blood loss:                            Minimal. Estimated Blood Loss:     Estimated blood loss was minimal. Impression:               - One 4 mm polyp in the ascending colon, removed                            with a cold snare. Resected  and retrieved.                           - Two 4 to 5 mm polyps in the transverse colon,                            removed with a cold snare. Resected and retrieved.                           - Three 3 mm polyps in the rectum, removed with a                            cold biopsy forceps. Resected and retrieved.                           - Non-bleeding internal hemorrhoids, which are the  most likely cause for the patient's symptoms. Recommendation:           - Patient has a contact number available for                            emergencies. The signs and symptoms of potential                            delayed complications were discussed with the                            patient. Return to normal activities tomorrow.                            Written discharge instructions were provided to the                            patient.                           - Resume previous diet.                           - Daily fiber supplement for treatment of                            hemorrhoids                           - Continue present medications.                           - No ibuprofen, naproxen, or other non-steroidal                            anti-inflammatory drugs for 2 weeks after polyp                            removal.                           - Await pathology results.                           - Repeat colonoscopy is recommended for                            surveillance. The colonoscopy date will be                            determined after pathology results from today's                            exam become available for review.                           - Follow up as needed in the clinic for hemorrhoid  banding if symptoms persit Remo Lipps P. Nathanyel Defenbaugh, MD 03/31/2016 3:31:02 PM This report has been signed electronically.

## 2016-03-31 NOTE — Progress Notes (Signed)
To pacu vss patent aw reprot to rn 

## 2016-03-31 NOTE — Progress Notes (Signed)
Called to room to assist during endoscopic procedure.  Patient ID and intended procedure confirmed with present staff. Received instructions for my participation in the procedure from the performing physician.  

## 2016-03-31 NOTE — Patient Instructions (Signed)
Discharge instructions given. Handouts on polyps and hemorrhoids. Hold aspirin and all other anti-inflammatory medications for 2 weeks. Resume previous medications. YOU HAD AN ENDOSCOPIC PROCEDURE TODAY AT [facility]:   Refer to the procedure report that was given to you for any specific questions about what was found during the examination.  If the procedure report does not answer your questions, please call your gastroenterologist to clarify.  If you requested that your care partner not be given the details of your procedure findings, then the procedure report has been included in a sealed envelope for you to review at your convenience later.  YOU SHOULD EXPECT: Some feelings of bloating in the abdomen. Passage of more gas than usual.  Walking can help get rid of the air that was put into your GI tract during the procedure and reduce the bloating. If you had a lower endoscopy (such as a colonoscopy or flexible sigmoidoscopy) you may notice spotting of blood in your stool or on the toilet paper. If you underwent a bowel prep for your procedure, you may not have a normal bowel movement for a few days.  Please Note:  You might notice some irritation and congestion in your nose or some drainage.  This is from the oxygen used during your procedure.  There is no need for concern and it should clear up in a day or so.  SYMPTOMS TO REPORT IMMEDIATELY:   Following lower endoscopy (colonoscopy or flexible sigmoidoscopy):  Excessive amounts of blood in the stool  Significant tenderness or worsening of abdominal pains  Swelling of the abdomen that is new, acute  Fever of 100F or higher   For urgent or emergent issues, a gastroenterologist can be reached at any hour by calling [phone].   DIET: Your first meal following the procedure should be a small meal and then it is ok to progress to your normal diet. Heavy or fried foods are harder to digest and may make you feel nauseous  or bloated.  Likewise, meals heavy in dairy and vegetables can increase bloating.  Drink plenty of fluids but you should avoid alcoholic beverages for 24 hours.  ACTIVITY:  You should plan to take it easy for the rest of today and you should NOT DRIVE or use heavy machinery until tomorrow (because of the sedation medicines used during the test).    FOLLOW UP: Our staff will call the number listed on your records the next business day following your procedure to check on you and address any questions or concerns that you may have regarding the information given to you following your procedure. If we do not reach you, we will leave a message.  However, if you are feeling well and you are not experiencing any problems, there is no need to return our call.  We will assume that you have returned to your regular daily activities without incident.  If any biopsies were taken you will be contacted by phone or by letter within the next 1-3 weeks.  Please call us at [phone] if you have not heard about the biopsies in 3 weeks.    SIGNATURES/CONFIDENTIALITY: You and/or your care partner have signed paperwork which will be entered into your electronic medical record.  These signatures attest to the fact that that the information above on your After Visit Summary has been reviewed and is understood.  Full responsibility of the confidentiality of this discharge information lies with you and/or your care-partner.

## 2016-04-01 ENCOUNTER — Telehealth: Payer: Self-pay | Admitting: *Deleted

## 2016-04-01 NOTE — Telephone Encounter (Signed)
No answer, message left for the patient. 

## 2016-04-05 ENCOUNTER — Encounter: Payer: Self-pay | Admitting: Gastroenterology

## 2016-04-11 ENCOUNTER — Telehealth: Payer: Self-pay | Admitting: Nurse Practitioner

## 2016-04-11 NOTE — Telephone Encounter (Signed)
[name]  Pt is having a great deal of anxiety and depression, feeling overwhelmed She feels she needs to be seen but Wed is the only day she don't have  Finger babies. Your schedule is already fairly full. She states she has been On anxiety/drepression meds before not on any now. Wants to know if she  Needs an appt or can she go back on a med?   [employer]

## 2016-04-13 ENCOUNTER — Ambulatory Visit (INDEPENDENT_AMBULATORY_CARE_PROVIDER_SITE_OTHER): Payer: Managed Care, Other (non HMO) | Admitting: Nurse Practitioner

## 2016-04-13 ENCOUNTER — Encounter: Payer: Self-pay | Admitting: Internal Medicine

## 2016-04-13 ENCOUNTER — Ambulatory Visit (INDEPENDENT_AMBULATORY_CARE_PROVIDER_SITE_OTHER): Payer: Managed Care, Other (non HMO) | Admitting: Internal Medicine

## 2016-04-13 ENCOUNTER — Encounter: Payer: Self-pay | Admitting: Nurse Practitioner

## 2016-04-13 VITALS — BP 128/75 | HR 86 | Ht 67.0 in | Wt 203.0 lb

## 2016-04-13 VITALS — BP 138/80 | Ht 66.0 in | Wt 203.4 lb

## 2016-04-13 DIAGNOSIS — I495 Sick sinus syndrome: Secondary | ICD-10-CM | POA: Diagnosis not present

## 2016-04-13 DIAGNOSIS — F418 Other specified anxiety disorders: Secondary | ICD-10-CM | POA: Diagnosis not present

## 2016-04-13 DIAGNOSIS — F329 Major depressive disorder, single episode, unspecified: Secondary | ICD-10-CM | POA: Insufficient documentation

## 2016-04-13 DIAGNOSIS — F419 Anxiety disorder, unspecified: Principal | ICD-10-CM

## 2016-04-13 DIAGNOSIS — F32A Depression, unspecified: Secondary | ICD-10-CM | POA: Insufficient documentation

## 2016-04-13 LAB — CUP PACEART INCLINIC DEVICE CHECK
Battery Voltage: 2.93 V
Brady Statistic RA Percent Paced: 1.6 %
Implantable Lead Implant Date: 20111130
Implantable Lead Location: 753859
Implantable Lead Location: 753860
Lead Channel Impedance Value: 380 Ohm
Lead Channel Pacing Threshold Amplitude: 1 V
Lead Channel Pacing Threshold Pulse Width: 0.4 ms
Lead Channel Sensing Intrinsic Amplitude: 3.4 mV
Lead Channel Sensing Intrinsic Amplitude: 5.2 mV
Lead Channel Setting Pacing Amplitude: 1.625
Lead Channel Setting Sensing Sensitivity: 2 mV
MDC IDC LEAD IMPLANT DT: 20111130
MDC IDC MSMT BATTERY REMAINING LONGEVITY: 103 mo
MDC IDC MSMT LEADCHNL RV IMPEDANCE VALUE: 430 Ohm
MDC IDC MSMT LEADCHNL RV PACING THRESHOLD AMPLITUDE: 1.25 V
MDC IDC MSMT LEADCHNL RV PACING THRESHOLD PULSEWIDTH: 0.4 ms
MDC IDC SESS DTM: 20170503101952
MDC IDC SET LEADCHNL RA PACING AMPLITUDE: 2 V
MDC IDC SET LEADCHNL RV PACING PULSEWIDTH: 0.4 ms
MDC IDC STAT BRADY RV PERCENT PACED: 1 % — AB
Pulse Gen Serial Number: 7192850

## 2016-04-13 MED ORDER — PAROXETINE HCL ER 25 MG PO TB24: 25.0000 mg | ORAL_TABLET | Freq: Every day | ORAL | Status: DC

## 2016-04-13 MED ORDER — ALPRAZOLAM 1 MG PO TABS: ORAL_TABLET | ORAL | Status: DC

## 2016-04-13 NOTE — Progress Notes (Signed)
HPI [name] returns today for followup. She is a very pleasant middle-age woman with symptomatic bradycardia, status post permanent pacemaker insertion, tobacco abuse, and gastroesophageal reflux disease. She denies chest pain or shortness of breath. No peripheral edema. She has been found to have colon polyps. She continues to smoke but is planning to quit.  Allergies  Allergen Reactions  . Betadine [Povidone Iodine]     Blisters   . Codeine   . Povidone-Iodine   . Sulfonamide Derivatives   . Vioxx [Rofecoxib] Rash     Current Outpatient Prescriptions  Medication Sig Dispense Refill  . aspirin 81 MG tablet Take 162 mg by mouth daily.    . Cholecalciferol (VITAMIN D3) 1000 UNITS tablet Take 1,000 Units by mouth daily.      . fluticasone (FLONASE) 50 MCG/ACT nasal spray Place 1 spray into both nostrils daily. 16 g 5  . levothyroxine (SYNTHROID, LEVOTHROID) 75 MCG tablet TAKE ONE TABLET BY MOUTH ONCE DAILY 30 tablet 11  . omeprazole (PRILOSEC) 20 MG capsule Take 1 capsule (20 mg total) by mouth 2 (two) times daily. 60 capsule 0   No current facility-administered medications for this visit.     Past Medical History  Diagnosis Date  . IBS (irritable bowel syndrome)   . Anxiety   . Depression     bipolar disorder  . GERD (gastroesophageal reflux disease)   . HLD (hyperlipidemia)   . Hypothyroidism   . OSA (obstructive sleep apnea)   . Heart defect   . Arthritis   . Esophageal stricture   . Reflux   . IFG (impaired fasting glucose)   . Migraine headache   . Symptomatic bradycardia   . Sleep apnea     ROS:   All systems reviewed and negative except as noted in the HPI.   Past Surgical History  Procedure Laterality Date  . Cesarean sections      x2  . Tubal ligation    . Abdominal hernia repair    . Vaginal hysterectomy    . Cholecystectomy    . Foot surgery      screws in left foot  . Breast cyst excision      left  . Dental implants    . Pacemaker  insertion    . Foor surgery Right     screws   . Colonoscopy       Family History  Problem Relation Age of Onset  . Throat cancer Father     squamous cell carcinoma  . Tongue cancer Father   . Esophageal cancer Father   . Arthritis Mother   . Heart disease Mother     coronary; female < 27  . Asthma Mother   . Colon cancer Neg Hx      Social History   Social History  . Marital Status: Married    Spouse Name: N/A  . Number of Children: 2  . Years of Education: N/A   Occupational History  . retired    Social History Main Topics  . Smoking status: Current Every Day Smoker -- 1.00 packs/day for 25 years    Types: Cigarettes  . Smokeless tobacco: Never Used     Comment: tobacco info givin 03/08/16  . Alcohol Use: No     Comment: Occ.   . Drug Use: No  . Sexual Activity: Not on file   Other Topics Concern  . Not on file   Social History Narrative   Unemployed. 10 cans of soda  per day.      BP 128/75 mmHg  Pulse 86  Ht 5\' 7"  (1.702 m)  Wt 203 lb (92.08 kg)  BMI 31.79 kg/m2  SpO2 97%  Physical Exam:  Well appearing middle-age woman,NAD HEENT: Unremarkable Neck:  7 cm JVD, no thyromegally Lungs:  Clear with no wheezes, rales, or rhonchi. Well-healed pacemaker incision. HEART:  Regular rate rhythm, no murmurs, no rubs, no clicks Abd:  soft, positive bowel sounds, no organomegally, no rebound, no guarding Ext:  2 plus pulses, no edema, no cyanosis, no clubbing Skin:  No rashes no nodules Neuro:  CN II through XII intact, motor grossly intact  DEVICE  Normal device function.  See PaceArt for details.   Assess/Plan: 1. Sinus node dysfunction - she is stable s/p PPM insertion 2. PAF - PM interogation demonstrates that she has had up to 14 minutes of atrial fib. If/when she has more than an hour at a time, I would consider starting anti-coagulation. 3. PM - her [employer] DDD PM is working normally. Will recheck in several months.  [name].D.

## 2016-04-13 NOTE — Telephone Encounter (Signed)
Pt opted to come this morning

## 2016-04-13 NOTE — Telephone Encounter (Signed)
Spoke with Pamala Hurry; may give her an appointment for Wednesday afternoon

## 2016-04-13 NOTE — Progress Notes (Signed)
Subjective:  Presents for complaints of a flareup of her anxiety and depression. Was treated for this over 10 years ago. Has noticed worsening symptoms over the past 2 months. Several things have contributed to her symptoms including the stress of helping with her grandchildren; 10th year anniversary of losing her son in automobile accident; her daughter is getting married this month and her 69 recently aborted twins. States all these things combining together seem to be making her symptoms worse. Denies any suicidal or homicidal thoughts or ideation. Has remote history of suicidality. Sleeping about 4-5 hours per night on average. Emotional lability. Decreased appetite. Review of the paper chart shows the patient has taken Paxil well in the past.  Objective:   BP 138/80 mmHg  Ht 5\' 6"  (1.676 m)  Wt 203 lb 6.4 oz (92.262 kg)  BMI 32.85 kg/m2 NAD. Alert, oriented. Thoughts logical coherent and relevant. Crying during most of the office visit. Lungs clear. Heart regular rate rhythm.  Assessment:  Problem List Items Addressed This Visit      Other   Anxiety and depression - Primary     Plan:  Meds ordered this encounter  Medications  . PARoxetine (PAXIL-CR) 25 MG 24 hr tablet    Sig: Take 1 tablet (25 mg total) by mouth daily.    Dispense:  30 tablet    Refill:  0    Order Specific Question:  Supervising Provider    Answer:  Mikey Kirschner [[id number]]  . ALPRAZolam (XANAX) 1 MG tablet    Sig: 1/2 po BID prn anxiety or sleep    Dispense:  30 tablet    Refill:  0    Order Specific Question:  Supervising Provider    Answer:  Mikey Kirschner [[id number]]   Restart Paxil CR at previous dose. Given a prescription for Xanax, takes sparingly. Take half to one pill for sleep, may take half a pill during the day if needed. Recheck later this month, call back sooner if any problems. Seek help immediately if any suicidal ideation.

## 2016-04-13 NOTE — Patient Instructions (Signed)
Your physician wants you to follow-up in: 1 Year with Dr. Lovena Le. You will receive a reminder letter in the mail two months in advance. If you don't receive a letter, please call our office to schedule the follow-up appointment.  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 This monitoring reduces the number of office visits required to check your device to one time per year. It allows [place] to keep an eye on the functioning of your device to ensure it is working properly. You are scheduled for a device check from home on 07/13/16. You may send your transmission at any time that day. If you have a wireless device, the transmission will be sent automatically. After your physician reviews your transmission, you will receive a postcard with your next transmission date.  If you need a refill on your cardiac medications before your next appointment, please call your pharmacy.  Thank you for choosing [facility]!

## 2016-05-06 ENCOUNTER — Encounter: Payer: Managed Care, Other (non HMO) | Admitting: Internal Medicine

## 2016-05-13 ENCOUNTER — Encounter: Payer: Self-pay | Admitting: Nurse Practitioner

## 2016-05-13 ENCOUNTER — Ambulatory Visit (INDEPENDENT_AMBULATORY_CARE_PROVIDER_SITE_OTHER): Payer: Managed Care, Other (non HMO) | Admitting: Nurse Practitioner

## 2016-05-13 VITALS — BP 140/80 | Ht 66.0 in | Wt 203.0 lb

## 2016-05-13 DIAGNOSIS — F418 Other specified anxiety disorders: Secondary | ICD-10-CM

## 2016-05-13 DIAGNOSIS — F329 Major depressive disorder, single episode, unspecified: Secondary | ICD-10-CM

## 2016-05-13 DIAGNOSIS — F32A Depression, unspecified: Secondary | ICD-10-CM

## 2016-05-13 DIAGNOSIS — F419 Anxiety disorder, unspecified: Principal | ICD-10-CM

## 2016-05-13 MED ORDER — PAROXETINE HCL ER 37.5 MG PO TB24: 37.5000 mg | ORAL_TABLET | Freq: Every day | ORAL | Status: DC

## 2016-05-13 MED ORDER — ALPRAZOLAM 1 MG PO TABS: ORAL_TABLET | ORAL | Status: DC

## 2016-05-13 NOTE — Progress Notes (Signed)
Subjective:  Presents for follow-up. Has seen some improvement with Paxil CR 25 mg daily. Continues to experience some anxiety and stress, keeping 2 young grandchildren over 10 hours a day 5 days a week. Has been picking at her skin on her upper arms at times which relieves her stress. Had to bring her granddaughter today who is sleepy and had her immunizations yesterday and very fussy during visit. Xanax has been helping some for sleep. Has to take a half pill due to excessive grogginess the next morning.  Objective:   BP 140/80 mmHg  Ht 5\' 6"  (1.676 m)  Wt 203 lb (92.08 kg)  BMI 32.78 kg/m2 NAD. Alert, oriented. Lungs clear. Heart regular rate rhythm.  Assessment:  Problem List Items Addressed This Visit      Other   Anxiety and depression - Primary     Plan:  Meds ordered this encounter  Medications  . PARoxetine (PAXIL-CR) 37.5 MG 24 hr tablet    Sig: Take 1 tablet (37.5 mg total) by mouth daily.    Dispense:  30 tablet    Refill:  5    Order Specific Question:  Supervising Provider    Answer:  Mikey Kirschner [[id number]]  . ALPRAZolam (XANAX) 1 MG tablet    Sig: 1/2 po BID prn anxiety or sleep    Dispense:  30 tablet    Refill:  5    May refill monthly    Order Specific Question:  Supervising Provider    Answer:  Mikey Kirschner [[id number]]   Continue current medications as directed. Discussed importance of stress reduction. Return in about 3 months (around 08/13/2016) for recheck. Call back sooner if any problems. Granddaughter was an unavoidable distraction during visit, very fussy.

## 2016-05-21 ENCOUNTER — Other Ambulatory Visit: Payer: Self-pay | Admitting: Family Medicine

## 2016-07-13 ENCOUNTER — Ambulatory Visit (INDEPENDENT_AMBULATORY_CARE_PROVIDER_SITE_OTHER): Payer: Managed Care, Other (non HMO) | Admitting: *Deleted

## 2016-07-13 DIAGNOSIS — I495 Sick sinus syndrome: Secondary | ICD-10-CM | POA: Diagnosis not present

## 2016-07-13 NOTE — Progress Notes (Signed)
Remote pacemaker transmission.   

## 2016-07-15 ENCOUNTER — Ambulatory Visit (INDEPENDENT_AMBULATORY_CARE_PROVIDER_SITE_OTHER): Payer: Managed Care, Other (non HMO) | Admitting: Nurse Practitioner

## 2016-07-15 ENCOUNTER — Encounter: Payer: Self-pay | Admitting: Nurse Practitioner

## 2016-07-15 VITALS — BP 112/78 | Ht 66.0 in | Wt 211.0 lb

## 2016-07-15 DIAGNOSIS — F32A Depression, unspecified: Secondary | ICD-10-CM

## 2016-07-15 DIAGNOSIS — F418 Other specified anxiety disorders: Secondary | ICD-10-CM

## 2016-07-15 DIAGNOSIS — F329 Major depressive disorder, single episode, unspecified: Secondary | ICD-10-CM

## 2016-07-15 DIAGNOSIS — F419 Anxiety disorder, unspecified: Principal | ICD-10-CM

## 2016-07-15 NOTE — Patient Instructions (Signed)
Melatonin 5 mg at night for sleep

## 2016-07-16 ENCOUNTER — Encounter: Payer: Self-pay | Admitting: Nurse Practitioner

## 2016-07-16 NOTE — Progress Notes (Signed)
Subjective:  Presents for recheck. Doing well on current regimen. Less emotional lability. Still has trouble sleeping but takes a nap during the day when her grandchildren sleep. Slight improvement with Xanax; can sleep for a few hours.  Objective:   BP 112/78   Ht 5\' 6"  (1.676 m)   Wt 211 lb (95.7 kg)   BMI 34.06 kg/m  NAD. Alert, oriented. Lungs clear. Heart RRR.   Assessment:  Problem List Items Addressed This Visit      Other   Anxiety and depression - Primary    Other Visit Diagnoses   None.    Plan: continue current regimen. Return in about 6 months (around 01/15/2017) for recheck. Call back sooner if any problems. Reminded patient about preventive health physical.

## 2016-07-20 ENCOUNTER — Encounter: Payer: Self-pay | Admitting: Cardiology

## 2016-07-29 LAB — CUP PACEART REMOTE DEVICE CHECK
Battery Remaining Longevity: 111 mo
Battery Voltage: 2.93 V
Brady Statistic AP VP Percent: 1 %
Brady Statistic AS VP Percent: 1 %
Brady Statistic RA Percent Paced: 3.8 %
Date Time Interrogation Session: 20170802074934
Implantable Lead Implant Date: 20111130
Lead Channel Impedance Value: 380 Ohm
Lead Channel Pacing Threshold Amplitude: 1.25 V
Lead Channel Setting Pacing Pulse Width: 0.4 ms
Lead Channel Setting Sensing Sensitivity: 2 mV
MDC IDC LEAD IMPLANT DT: 20111130
MDC IDC LEAD LOCATION: 753859
MDC IDC LEAD LOCATION: 753860
MDC IDC MSMT BATTERY REMAINING PERCENTAGE: 81 %
MDC IDC MSMT LEADCHNL RA PACING THRESHOLD AMPLITUDE: 0.875 V
MDC IDC MSMT LEADCHNL RA PACING THRESHOLD PULSEWIDTH: 0.4 ms
MDC IDC MSMT LEADCHNL RA SENSING INTR AMPL: 3.4 mV
MDC IDC MSMT LEADCHNL RV IMPEDANCE VALUE: 430 Ohm
MDC IDC MSMT LEADCHNL RV PACING THRESHOLD PULSEWIDTH: 0.4 ms
MDC IDC MSMT LEADCHNL RV SENSING INTR AMPL: 4.2 mV
MDC IDC PG SERIAL: 7192850
MDC IDC SET LEADCHNL RA PACING AMPLITUDE: 1.875
MDC IDC SET LEADCHNL RV PACING AMPLITUDE: 1.5 V
MDC IDC STAT BRADY AP VS PERCENT: 4.2 %
MDC IDC STAT BRADY AS VS PERCENT: 96 %
MDC IDC STAT BRADY RV PERCENT PACED: 1 %
Pulse Gen Model: 2210

## 2016-08-05 ENCOUNTER — Encounter: Payer: Self-pay | Admitting: Cardiology

## 2016-10-12 ENCOUNTER — Ambulatory Visit (INDEPENDENT_AMBULATORY_CARE_PROVIDER_SITE_OTHER): Payer: Managed Care, Other (non HMO) | Admitting: *Deleted

## 2016-10-12 DIAGNOSIS — I495 Sick sinus syndrome: Secondary | ICD-10-CM | POA: Diagnosis not present

## 2016-10-12 NOTE — Progress Notes (Signed)
Remote pacemaker transmission.   

## 2016-10-19 ENCOUNTER — Encounter: Payer: Self-pay | Admitting: Cardiology

## 2016-10-21 ENCOUNTER — Encounter: Payer: Self-pay | Admitting: Nurse Practitioner

## 2016-10-21 ENCOUNTER — Ambulatory Visit (INDEPENDENT_AMBULATORY_CARE_PROVIDER_SITE_OTHER): Payer: Managed Care, Other (non HMO) | Admitting: Nurse Practitioner

## 2016-10-21 VITALS — BP 118/78 | Ht 66.0 in | Wt 216.8 lb

## 2016-10-21 DIAGNOSIS — M5442 Lumbago with sciatica, left side: Secondary | ICD-10-CM

## 2016-10-21 MED ORDER — PREDNISONE 20 MG PO TABS: ORAL_TABLET | ORAL | 0 refills | Status: DC

## 2016-10-21 MED ORDER — CYCLOBENZAPRINE HCL 10 MG PO TABS: 10.0000 mg | ORAL_TABLET | Freq: Three times a day (TID) | ORAL | 0 refills | Status: DC | PRN

## 2016-10-21 NOTE — Patient Instructions (Addendum)
Ice/heat biofreeze Back exercises   Low Back Sprain With Rehab A sprain is an injury in which a ligament is torn. The ligaments of the lower back are vulnerable to sprains. However, they are strong and require great force to be injured. These ligaments are important for stabilizing the spinal column. Sprains are classified into three categories. Grade 1 sprains cause pain, but the tendon is not lengthened. Grade 2 sprains include a lengthened ligament, due to the ligament being stretched or partially ruptured. With grade 2 sprains there is still function, although the function may be decreased. Grade 3 sprains involve a complete tear of the tendon or muscle, and function is usually impaired. SYMPTOMS   Severe pain in the lower back.  Sometimes, a feeling of a "pop," "snap," or tear, at the time of injury.  Tenderness and sometimes swelling at the injury site.  Uncommonly, bruising (contusion) within 48 hours of injury.  Muscle spasms in the back. CAUSES  Low back sprains occur when a force is placed on the ligaments that is greater than they can handle. Common causes of injury include:  Performing a stressful act while off-balance.  Repetitive stressful activities that involve movement of the lower back.  Direct hit (trauma) to the lower back. RISK INCREASES WITH:  Contact sports (football, wrestling).  Collisions (major skiing accidents).  Sports that require throwing or lifting (baseball, weightlifting).  Sports involving twisting of the spine (gymnastics, diving, tennis, golf).  Poor strength and flexibility.  Inadequate protection.  Previous back injury or surgery (especially fusion). PREVENTION  Wear properly fitted and padded protective equipment.  Warm up and stretch properly before activity.  Allow for adequate recovery between workouts.  Maintain physical fitness:  Strength, flexibility, and endurance.  Cardiovascular fitness.  Maintain a healthy body  weight. PROGNOSIS  If treated properly, low back sprains usually heal with non-surgical treatment. The length of time for healing depends on the severity of the injury.  RELATED COMPLICATIONS   Recurring symptoms, resulting in a chronic problem.  Chronic inflammation and pain in the low back.  Delayed healing or resolution of symptoms, especially if activity is resumed too soon.  Prolonged impairment.  Unstable or arthritic joints of the low back. TREATMENT  Treatment first involves the use of ice and medicine, to reduce pain and inflammation. The use of strengthening and stretching exercises may help reduce pain with activity. These exercises may be performed at home or with a therapist. Severe injuries may require referral to a therapist for further evaluation and treatment, such as ultrasound. Your caregiver may advise that you wear a back brace or corset, to help reduce pain and discomfort. Often, prolonged bed rest results in greater harm then benefit. Corticosteroid injections may be recommended. However, these should be reserved for the most serious cases. It is important to avoid using your back when lifting objects. At night, sleep on your back on a firm mattress, with a pillow placed under your knees. If non-surgical treatment is unsuccessful, surgery may be needed.  MEDICATION   If pain medicine is needed, nonsteroidal anti-inflammatory medicines (aspirin and ibuprofen), or other minor pain relievers (acetaminophen), are often advised.  Do not take pain medicine for 7 days before surgery.  Prescription pain relievers may be given, if your caregiver thinks they are needed. Use only as directed and only as much as you need.  Ointments applied to the skin may be helpful.  Corticosteroid injections may be given by your caregiver. These injections should be reserved  for the most serious cases, because they may only be given a certain number of times. HEAT AND COLD  Cold treatment  (icing) should be applied for 10 to 15 minutes every 2 to 3 hours for inflammation and pain, and immediately after activity that aggravates your symptoms. Use ice packs or an ice massage.  Heat treatment may be used before performing stretching and strengthening activities prescribed by your caregiver, physical therapist, or athletic trainer. Use a heat pack or a warm water soak. SEEK MEDICAL CARE IF:   Symptoms get worse or do not improve in 2 to 4 weeks, despite treatment.  You develop numbness or weakness in either leg.  You lose bowel or bladder function.  Any of the following occur after surgery: fever, increased pain, swelling, redness, drainage of fluids, or bleeding in the affected area.  New, unexplained symptoms develop. (Drugs used in treatment may produce side effects.) EXERCISES  RANGE OF MOTION (ROM) AND STRETCHING EXERCISES - Low Back Sprain Most people with lower back pain will find that their symptoms get worse with excessive bending forward (flexion) or arching at the lower back (extension). The exercises that will help resolve your symptoms will focus on the opposite motion.  Your physician, physical therapist or athletic trainer will help you determine which exercises will be most helpful to resolve your lower back pain. Do not complete any exercises without first consulting with your caregiver. Discontinue any exercises which make your symptoms worse, until you speak to your caregiver. If you have pain, numbness or tingling which travels down into your buttocks, leg or foot, the goal of the therapy is for these symptoms to move closer to your back and eventually resolve. Sometimes, these leg symptoms will get better, but your lower back pain may worsen. This is often an indication of progress in your rehabilitation. Be very alert to any changes in your symptoms and the activities in which you participated in the 24 hours prior to the change. Sharing this information with your  caregiver will allow him or her to most efficiently treat your condition. These exercises may help you when beginning to rehabilitate your injury. Your symptoms may resolve with or without further involvement from your physician, physical therapist or athletic trainer. While completing these exercises, remember:   Restoring tissue flexibility helps normal motion to return to the joints. This allows healthier, less painful movement and activity.  An effective stretch should be held for at least 30 seconds.  A stretch should never be painful. You should only feel a gentle lengthening or release in the stretched tissue. FLEXION RANGE OF MOTION AND STRETCHING EXERCISES: STRETCH - Flexion, Single Knee to Chest   Lie on a firm bed or floor with both legs extended in front of you.  Keeping one leg in contact with the floor, bring your opposite knee to your chest. Hold your leg in place by either grabbing behind your thigh or at your knee.  Pull until you feel a gentle stretch in your low back. Hold __________ seconds.  Slowly release your grasp and repeat the exercise with the opposite side. Repeat __________ times. Complete this exercise __________ times per day.  STRETCH - Flexion, Double Knee to Chest  Lie on a firm bed or floor with both legs extended in front of you.  Keeping one leg in contact with the floor, bring your opposite knee to your chest.  Tense your stomach muscles to support your back and then lift your other knee to  your chest. Hold your legs in place by either grabbing behind your thighs or at your knees.  Pull both knees toward your chest until you feel a gentle stretch in your low back. Hold __________ seconds.  Tense your stomach muscles and slowly return one leg at a time to the floor. Repeat __________ times. Complete this exercise __________ times per day.  STRETCH - Low Trunk Rotation  Lie on a firm bed or floor. Keeping your legs in front of you, bend your knees  so they are both pointed toward the ceiling and your feet are flat on the floor.  Extend your arms out to the side. This will stabilize your upper body by keeping your shoulders in contact with the floor.  Gently and slowly drop both knees together to one side until you feel a gentle stretch in your low back. Hold for __________ seconds.  Tense your stomach muscles to support your lower back as you bring your knees back to the starting position. Repeat the exercise to the other side. Repeat __________ times. Complete this exercise __________ times per day  EXTENSION RANGE OF MOTION AND FLEXIBILITY EXERCISES: STRETCH - Extension, Prone on Elbows   Lie on your stomach on the floor, a bed will be too soft. Place your palms about shoulder width apart and at the height of your head.  Place your elbows under your shoulders. If this is too painful, stack pillows under your chest.  Allow your body to relax so that your hips drop lower and make contact more completely with the floor.  Hold this position for __________ seconds.  Slowly return to lying flat on the floor. Repeat __________ times. Complete this exercise __________ times per day.  RANGE OF MOTION - Extension, Prone Press Ups  Lie on your stomach on the floor, a bed will be too soft. Place your palms about shoulder width apart and at the height of your head.  Keeping your back as relaxed as possible, slowly straighten your elbows while keeping your hips on the floor. You may adjust the placement of your hands to maximize your comfort. As you gain motion, your hands will come more underneath your shoulders.  Hold this position __________ seconds.  Slowly return to lying flat on the floor. Repeat __________ times. Complete this exercise __________ times per day.  RANGE OF MOTION- Quadruped, Neutral Spine   Assume a hands and knees position on a firm surface. Keep your hands under your shoulders and your knees under your hips. You may  place padding under your knees for comfort.  Drop your head and point your tailbone toward the ground below you. This will round out your lower back like an angry cat. Hold this position for __________ seconds.  Slowly lift your head and release your tail bone so that your back sags into a large arch, like an old horse.  Hold this position for __________ seconds.  Repeat this until you feel limber in your low back.  Now, find your "sweet spot." This will be the most comfortable position somewhere between the two previous positions. This is your neutral spine. Once you have found this position, tense your stomach muscles to support your low back.  Hold this position for __________ seconds. Repeat __________ times. Complete this exercise __________ times per day.  STRENGTHENING EXERCISES - Low Back Sprain These exercises may help you when beginning to rehabilitate your injury. These exercises should be done near your "sweet spot." This is the neutral, low-back  arch, somewhere between fully rounded and fully arched, that is your least painful position. When performed in this safe range of motion, these exercises can be used for people who have either a flexion or extension based injury. These exercises may resolve your symptoms with or without further involvement from your physician, physical therapist or athletic trainer. While completing these exercises, remember:   Muscles can gain both the endurance and the strength needed for everyday activities through controlled exercises.  Complete these exercises as instructed by your physician, physical therapist or athletic trainer. Increase the resistance and repetitions only as guided.  You may experience muscle soreness or fatigue, but the pain or discomfort you are trying to eliminate should never worsen during these exercises. If this pain does worsen, stop and make certain you are following the directions exactly. If the pain is still present after  adjustments, discontinue the exercise until you can discuss the trouble with your caregiver. STRENGTHENING - Deep Abdominals, Pelvic Tilt   Lie on a firm bed or floor. Keeping your legs in front of you, bend your knees so they are both pointed toward the ceiling and your feet are flat on the floor.  Tense your lower abdominal muscles to press your low back into the floor. This motion will rotate your pelvis so that your tail bone is scooping upwards rather than pointing at your feet or into the floor. With a gentle tension and even breathing, hold this position for __________ seconds. Repeat __________ times. Complete this exercise __________ times per day.  STRENGTHENING - Abdominals, Crunches   Lie on a firm bed or floor. Keeping your legs in front of you, bend your knees so they are both pointed toward the ceiling and your feet are flat on the floor. Cross your arms over your chest.  Slightly tip your chin down without bending your neck.  Tense your abdominals and slowly lift your trunk high enough to just clear your shoulder blades. Lifting higher can put excessive stress on the lower back and does not further strengthen your abdominal muscles.  Control your return to the starting position. Repeat __________ times. Complete this exercise __________ times per day.  STRENGTHENING - Quadruped, Opposite UE/LE Lift   Assume a hands and knees position on a firm surface. Keep your hands under your shoulders and your knees under your hips. You may place padding under your knees for comfort.  Find your neutral spine and gently tense your abdominal muscles so that you can maintain this position. Your shoulders and hips should form a rectangle that is parallel with the floor and is not twisted.  Keeping your trunk steady, lift your right hand no higher than your shoulder and then your left leg no higher than your hip. Make sure you are not holding your breath. Hold this position for __________  seconds.  Continuing to keep your abdominal muscles tense and your back steady, slowly return to your starting position. Repeat with the opposite arm and leg. Repeat __________ times. Complete this exercise __________ times per day.  STRENGTHENING - Abdominals and Quadriceps, Straight Leg Raise   Lie on a firm bed or floor with both legs extended in front of you.  Keeping one leg in contact with the floor, bend the other knee so that your foot can rest flat on the floor.  Find your neutral spine, and tense your abdominal muscles to maintain your spinal position throughout the exercise.  Slowly lift your straight leg off the floor about  6 inches for a count of 15, making sure to not hold your breath.  Still keeping your neutral spine, slowly lower your leg all the way to the floor. Repeat this exercise with each leg __________ times. Complete this exercise __________ times per day. POSTURE AND BODY MECHANICS CONSIDERATIONS - Low Back Sprain Keeping correct posture when sitting, standing or completing your activities will reduce the stress put on different body tissues, allowing injured tissues a chance to heal and limiting painful experiences. The following are general guidelines for improved posture. Your physician or physical therapist will provide you with any instructions specific to your needs. While reading these guidelines, remember:  The exercises prescribed by your provider will help you have the flexibility and strength to maintain correct postures.  The correct posture provides the best environment for your joints to work. All of your joints have less wear and tear when properly supported by a spine with good posture. This means you will experience a healthier, less painful body.  Correct posture must be practiced with all of your activities, especially prolonged sitting and standing. Correct posture is as important when doing repetitive low-stress activities (typing) as it is when  doing a single heavy-load activity (lifting). RESTING POSITIONS Consider which positions are most painful for you when choosing a resting position. If you have pain with flexion-based activities (sitting, bending, stooping, squatting), choose a position that allows you to rest in a less flexed posture. You would want to avoid curling into a fetal position on your side. If your pain worsens with extension-based activities (prolonged standing, working overhead), avoid resting in an extended position such as sleeping on your stomach. Most people will find more comfort when they rest with their spine in a more neutral position, neither too rounded nor too arched. Lying on a non-sagging bed on your side with a pillow between your knees, or on your back with a pillow under your knees will often provide some relief. Keep in mind, being in any one position for a prolonged period of time, no matter how correct your posture, can still lead to stiffness. PROPER SITTING POSTURE In order to minimize stress and discomfort on your spine, you must sit with correct posture. Sitting with good posture should be effortless for a healthy body. Returning to good posture is a gradual process. Many people can work toward this most comfortably by using various supports until they have the flexibility and strength to maintain this posture on their own. When sitting with proper posture, your ears will fall over your shoulders and your shoulders will fall over your hips. You should use the back of the chair to support your upper back. Your lower back will be in a neutral position, just slightly arched. You may place a small pillow or folded towel at the base of your lower back for  support.  When working at a desk, create an environment that supports good, upright posture. Without extra support, muscles tire, which leads to excessive strain on joints and other tissues. Keep these recommendations in mind: CHAIR:  A chair should be  able to slide under your desk when your back makes contact with the back of the chair. This allows you to work closely.  The chair's height should allow your eyes to be level with the upper part of your monitor and your hands to be slightly lower than your elbows. BODY POSITION  Your feet should make contact with the floor. If this is not possible, use a  foot rest.  Keep your ears over your shoulders. This will reduce stress on your neck and low back. INCORRECT SITTING POSTURES  If you are feeling tired and unable to assume a healthy sitting posture, do not slouch or slump. This puts excessive strain on your back tissues, causing more damage and pain. Healthier options include:  Using more support, like a lumbar pillow.  Switching tasks to something that requires you to be upright or walking.  Talking a brief walk.  Lying down to rest in a neutral-spine position. PROLONGED STANDING WHILE SLIGHTLY LEANING FORWARD  When completing a task that requires you to lean forward while standing in one place for a long time, place either foot up on a stationary 2-4 inch high object to help maintain the best posture. When both feet are on the ground, the lower back tends to lose its slight inward curve. If this curve flattens (or becomes too large), then the back and your other joints will experience too much stress, tire more quickly, and can cause pain. CORRECT STANDING POSTURES Proper standing posture should be assumed with all daily activities, even if they only take a few moments, like when brushing your teeth. As in sitting, your ears should fall over your shoulders and your shoulders should fall over your hips. You should keep a slight tension in your abdominal muscles to brace your spine. Your tailbone should point down to the ground, not behind your body, resulting in an over-extended swayback posture.  INCORRECT STANDING POSTURES  Common incorrect standing postures include a forward head, locked  knees and/or an excessive swayback. WALKING Walk with an upright posture. Your ears, shoulders and hips should all line-up. PROLONGED ACTIVITY IN A FLEXED POSITION When completing a task that requires you to bend forward at your waist or lean over a low surface, try to find a way to stabilize 3 out of 4 of your limbs. You can place a hand or elbow on your thigh or rest a knee on the surface you are reaching across. This will provide you more stability, so that your muscles do not tire as quickly. By keeping your knees relaxed, or slightly bent, you will also reduce stress across your lower back. CORRECT LIFTING TECHNIQUES DO :  Assume a wide stance. This will provide you more stability and the opportunity to get as close as possible to the object which you are lifting.  Tense your abdominals to brace your spine. Bend at the knees and hips. Keeping your back locked in a neutral-spine position, lift using your leg muscles. Lift with your legs, keeping your back straight.  Test the weight of unknown objects before attempting to lift them.  Try to keep your elbows locked down at your sides in order get the best strength from your shoulders when carrying an object.  Always ask for help when lifting heavy or awkward objects. INCORRECT LIFTING TECHNIQUES DO NOT:   Lock your knees when lifting, even if it is a small object.  Bend and twist. Pivot at your feet or move your feet when needing to change directions.  Assume that you can safely pick up even a paperclip without proper posture.   This information is not intended to replace advice given to you by your health care provider. Make sure you discuss any questions you have with your health care provider.   Document Released: 11/28/2005 Document Revised: 12/19/2014 Document Reviewed: 03/12/2009 Elsevier Interactive Patient Education [employer].

## 2016-10-23 ENCOUNTER — Encounter: Payer: Self-pay | Admitting: Nurse Practitioner

## 2016-10-23 NOTE — Progress Notes (Signed)
Subjective:  Presents for c/o low back pain off/on for several months. Has been picking up grandchildren. Soreness in right hip. Left low back pain, down buttock into posterior thigh area x 2 weeks. Describes a pulling sensation with tingling and burning.   Objective:   BP 118/78   Ht 5\' 6"  (1.676 m)   Wt 216 lb 12.8 oz (98.3 kg)   BMI 34.99 kg/m  NAD. Alert, oriented. Diffuse lumbar pain to palpation, more on the left. SLR neg bilat. Reflexes nl lower extremities.   Assessment: Acute left-sided low back pain with left-sided sciatica  Plan:  Meds ordered this encounter  Medications  . predniSONE (DELTASONE) 20 MG tablet    Sig: 3 po qd x 3 d then 2 po qd x 3 d then 1 po qd x 3 d    Dispense:  18 tablet    Refill:  0    Order Specific Question:   Supervising Provider    Answer:   Mikey Kirschner [[id number]]  . cyclobenzaprine (FLEXERIL) 10 MG tablet    Sig: Take 1 tablet (10 mg total) by mouth 3 (three) times daily as needed for muscle spasms.    Dispense:  30 tablet    Refill:  0    Order Specific Question:   Supervising Provider    Answer:   Mikey Kirschner [[id number]]   Given exercises for low back strain. Cannot use TENS due to her implant. Ice/heat applications.  Call back in 7-10 days if no improvement, sooner if worse.

## 2016-11-02 ENCOUNTER — Encounter: Payer: Self-pay | Admitting: Cardiology

## 2016-11-14 ENCOUNTER — Other Ambulatory Visit: Payer: Self-pay | Admitting: Nurse Practitioner

## 2016-11-20 ENCOUNTER — Encounter (HOSPITAL_COMMUNITY): Payer: Self-pay | Admitting: *Deleted

## 2016-11-20 ENCOUNTER — Emergency Department (HOSPITAL_COMMUNITY)
Admission: EM | Admit: 2016-11-20 | Discharge: 2016-11-20 | Disposition: A | Discharge status: Home / Self Care | Payer: Managed Care, Other (non HMO) | Attending: Emergency Medicine | Admitting: Emergency Medicine

## 2016-11-20 DIAGNOSIS — Z950 Presence of cardiac pacemaker: Secondary | ICD-10-CM | POA: Insufficient documentation

## 2016-11-20 DIAGNOSIS — F17210 Nicotine dependence, cigarettes, uncomplicated: Secondary | ICD-10-CM | POA: Insufficient documentation

## 2016-11-20 DIAGNOSIS — R55 Syncope and collapse: Secondary | ICD-10-CM | POA: Diagnosis present

## 2016-11-20 DIAGNOSIS — R202 Paresthesia of skin: Secondary | ICD-10-CM | POA: Insufficient documentation

## 2016-11-20 DIAGNOSIS — Z7982 Long term (current) use of aspirin: Secondary | ICD-10-CM | POA: Diagnosis not present

## 2016-11-20 DIAGNOSIS — E039 Hypothyroidism, unspecified: Secondary | ICD-10-CM | POA: Insufficient documentation

## 2016-11-20 DIAGNOSIS — Z79899 Other long term (current) drug therapy: Secondary | ICD-10-CM | POA: Insufficient documentation

## 2016-11-20 LAB — BASIC METABOLIC PANEL
ANION GAP: 8 (ref 5–15)
BUN: 5 mg/dL — ABNORMAL LOW (ref 6–20)
CALCIUM: 9.6 mg/dL (ref 8.9–10.3)
CO2: 30 mmol/L (ref 22–32)
Chloride: 104 mmol/L (ref 101–111)
Creatinine, Ser: 0.97 mg/dL (ref 0.44–1.00)
Glucose, Bld: 128 mg/dL — ABNORMAL HIGH (ref 65–99)
POTASSIUM: 3.2 mmol/L — AB (ref 3.5–5.1)
SODIUM: 142 mmol/L (ref 135–145)

## 2016-11-20 LAB — URINALYSIS, ROUTINE W REFLEX MICROSCOPIC
BILIRUBIN URINE: NEGATIVE
Glucose, UA: NEGATIVE mg/dL
Hgb urine dipstick: NEGATIVE
KETONES UR: NEGATIVE mg/dL
LEUKOCYTES UA: NEGATIVE
NITRITE: NEGATIVE
PH: 7 (ref 5.0–8.0)
Protein, ur: NEGATIVE mg/dL
SPECIFIC GRAVITY, URINE: 1.005 (ref 1.005–1.030)

## 2016-11-20 LAB — CBC
HEMATOCRIT: 46.1 % — AB (ref 36.0–46.0)
HEMOGLOBIN: 15.6 g/dL — AB (ref 12.0–15.0)
MCH: 30.4 pg (ref 26.0–34.0)
MCHC: 33.8 g/dL (ref 30.0–36.0)
MCV: 89.9 fL (ref 78.0–100.0)
Platelets: 412 10*3/uL — ABNORMAL HIGH (ref 150–400)
RBC: 5.13 MIL/uL — ABNORMAL HIGH (ref 3.87–5.11)
RDW: 13.1 % (ref 11.5–15.5)
WBC: 8.1 10*3/uL (ref 4.0–10.5)

## 2016-11-20 LAB — I-STAT TROPONIN, ED: TROPONIN I, POC: 0 ng/mL (ref 0.00–0.08)

## 2016-11-20 NOTE — ED Notes (Signed)
Walked pt from waiting area back to pt room, no issues. Pt put on monitor.

## 2016-11-20 NOTE — ED Provider Notes (Signed)
[facility] DEPT Provider Note   CSN: [id number] Arrival date & time: 11/20/16  1312     History   Chief Complaint Chief Complaint  Patient presents with  . Near Syncope    HPI [name] is a 55 y.o. 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  Pt noticed symptoms when she was walking her dogs ~1145 today.  Pt states she starting feeling tingling all over her body and then her vision felt like it was closing in as if she was "going through a tunnel". Pt felt like she was going to pass out.  Pt states she has had these exact same symptoms multiple times before, last one in 2011.  Pt states she had these symptoms for months in 2011, at that time her PCP referred her to a neurologist who worked her up and was unable to find a cause.  Pt then referred to cardiologist (Dr. Lovena Le) who gave her a cardiac monitor and diagnosed her with symptomatic bradycardia.  She received her PPM in 2011, she stopped having tingling and pre-syncopal episodes after PPM insertion.    Pt states her symptoms today are exactly the same as the symptoms she had back in 2011.  Pt states her PPM was interrogated 10/2016 and everything was normal.  Pt is compliant with all her medications.   Pt denies sudden/new headache, changes in vision, chest pain, shortness of breath, nausea, vomiting, sweating, seizure, tremor, numbness, facial drooping, speech impediment and balance issues at time of symptom onset.  Pt denies IVDU or other recreational drug use.  No ETOH use.  Pt smokes 1 ppd.   HPI  Past Medical History:  Diagnosis Date  . Anxiety   . Arthritis   . Depression    bipolar disorder  . Esophageal stricture   . GERD (gastroesophageal reflux disease)   . Heart defect   . HLD (hyperlipidemia)   .  Hypothyroidism   . IBS (irritable bowel syndrome)   . IFG (impaired fasting glucose)   . Migraine headache   . OSA (obstructive sleep apnea)   . Reflux   . Sleep apnea   . Symptomatic bradycardia     Patient Active Problem List   Diagnosis Date Noted  . Anxiety and depression 04/13/2016  . Hypothyroidism 07/04/2014  . Abnormal loss of weight 10/26/2011  . Rectal bleeding 10/26/2011  . GERD (gastroesophageal reflux disease) 10/26/2011  . PPM-St.[place] 02/14/2011  . 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 TOBACCO ABUSE 09/14/2010  . OBSTRUCTIVE SLEEP APNEA 09/14/2010  . SYNCOPE 09/14/2010  . IMPINGEMENT SYNDROME 04/12/2010    Past Surgical History:  Procedure Laterality Date  . ABDOMINAL HERNIA REPAIR    . BREAST CYST EXCISION     left  . Cesarean sections     x2  . CHOLECYSTECTOMY    . COLONOSCOPY    . dental implants    . foor surgery Right    screws   . FOOT SURGERY     screws in left foot  . PACEMAKER INSERTION    . TUBAL LIGATION    . VAGINAL HYSTERECTOMY      OB History    No data available       Home Medications    Prior to Admission medications  Medication Sig Start Date End Date Taking? Authorizing Provider  ALPRAZolam Duanne Moron) 1 MG tablet 1/2 po BID prn anxiety or sleep 05/13/16  Yes Nilda Simmer, NP  aspirin 81 MG tablet Take 162 mg by mouth daily.   Yes Historical Provider, MD  Cholecalciferol (VITAMIN D3) 1000 UNITS tablet Take 1,000 Units by mouth daily.     Yes Historical Provider, MD  cyclobenzaprine (FLEXERIL) 10 MG tablet Take 1 tablet (10 mg total) by mouth 3 (three) times daily as needed for muscle spasms. 10/21/16  Yes Nilda Simmer, NP  fluticasone (FLONASE) 50 MCG/ACT nasal spray Place 1 spray into both nostrils daily. 11/12/15  Yes Kathyrn Drown, MD  omeprazole (PRILOSEC) 20 MG capsule TAKE ONE CAPSULE BY MOUTH TWICE DAILY 05/23/16  Yes Mikey Kirschner, MD  PARoxetine (PAXIL-CR) 37.5 MG 24 hr tablet TAKE ONE  TABLET BY MOUTH ONCE DAILY 11/14/16  Yes Nilda Simmer, NP  levothyroxine (SYNTHROID, LEVOTHROID) 75 MCG tablet TAKE ONE TABLET BY MOUTH ONCE DAILY 12/04/15   Mikey Kirschner, MD    Family History Family History  Problem Relation Age of Onset  . Throat cancer Father     squamous cell carcinoma  . Tongue cancer Father   . Esophageal cancer Father   . Arthritis Mother   . Heart disease Mother     coronary; female < 79  . Asthma Mother   . Colon cancer Neg Hx     Social History Social History  Substance Use Topics  . Smoking status: Current Every Day Smoker    Packs/day: 1.00    Years: 25.00    Types: Cigarettes  . Smokeless tobacco: Never Used     Comment: tobacco info givin 03/08/16  . Alcohol use No     Comment: Occ.      Allergies   Betadine [povidone iodine]; Codeine; Povidone-iodine; Sulfonamide derivatives; and Vioxx [rofecoxib]   Review of Systems Review of Systems  Constitutional: Negative for chills, diaphoresis and fever.  HENT: Negative for congestion and trouble swallowing.   Eyes: Negative for visual disturbance.  Respiratory: Negative for shortness of breath.   Cardiovascular: Negative for chest pain, palpitations and leg swelling.  Gastrointestinal: Negative for abdominal pain and nausea.  Genitourinary: Negative for difficulty urinating.  Musculoskeletal: Negative for back pain, neck pain and neck stiffness.  Skin: Negative for rash.  Neurological: Positive for light-headedness. Negative for tremors, syncope, speech difficulty, weakness, numbness and headaches.  Psychiatric/Behavioral: Negative.      Physical Exam Updated Vital Signs BP 118/76 (BP Location: Left Arm)   Pulse 66   Temp 97.6 F (36.4 C) (Oral)   Resp 16   Ht 5\' 6"  (1.676 m)   Wt 97.5 kg   SpO2 98%   BMI 34.70 kg/m   Physical Exam  Constitutional: She is oriented to person, place, and time. She appears well-developed and well-nourished. No distress.  HENT:  Head:  Normocephalic and atraumatic.  Nose: Nose normal.  Mouth/Throat: Oropharynx is clear and moist. No oropharyngeal exudate.  Eyes: EOM are normal. Pupils are equal, round, and reactive to light.  Neck: Normal range of motion. Neck supple. No JVD present.  Cardiovascular: Normal rate, regular rhythm, normal heart sounds and intact distal pulses.   No murmur heard. Pulmonary/Chest: Effort normal and breath sounds normal. She has no wheezes.  Abdominal: Soft. Bowel sounds are normal. There is no tenderness.  Musculoskeletal: Normal range of motion.  Lymphadenopathy:    She has no cervical adenopathy.  Neurological: She is alert and oriented to person, place, and time.  Pt is alert and oriented.  Speech and phonation normal.  Thought process coherent.  Strength 5/5 in upper and lower extremities.  Sensation to light touch intact in upper and lower extremities. Intact finger to nose test. Gait normal no foot drag. Negative Romberg. CN I not tested CN II full visual fields  CN III, IV, VI PEERL and EOM intact bilaterally  CN V sensation to light touch intact in all 3 divisions of trigeminal nerve  CN VII facial nerve motor function intact and symmetric CN VIII hearing intact to finger rub bilaterally  CN IX, X no uvula deviation, symmetric soft palate rise CN XI 5/5 SCM and trapezius strength bilaterally  CN XII Tongue midline with symmetric L/R movement   Skin: Skin is warm and dry. Capillary refill takes less than 2 seconds.  Psychiatric: She has a normal mood and affect.  Nursing note and vitals reviewed.    ED Treatments / Results  Labs (all labs ordered are listed, but only abnormal results are displayed) Labs Reviewed  BASIC METABOLIC PANEL - Abnormal; Notable for the following:       Result Value   Potassium 3.2 (*)    Glucose, Bld 128 (*)    BUN 5 (*)    All other components within normal limits  CBC - Abnormal; Notable for the following:    RBC 5.13 (*)    Hemoglobin 15.6  (*)    HCT 46.1 (*)    Platelets 412 (*)    All other components within normal limits  URINALYSIS, ROUTINE W REFLEX MICROSCOPIC - Abnormal; Notable for the following:    Color, Urine STRAW (*)    All other components within normal limits  I-STAT TROPOININ, ED    EKG  EKG Interpretation  Date/Time:  Sunday November 20 2016 13:16:42 EST Ventricular Rate:  95 PR Interval:  138 QRS Duration: 84 QT Interval:  366 QTC Calculation: 459 R Axis:   64 Text Interpretation:  Normal sinus rhythm Normal ECG since last tracing no significant change Confirmed by Eulis Foster  MD, ELLIOTT [id number]) on 11/20/2016 6:40:15 PM       Radiology No results found.  Procedures Procedures (including critical care time)  Medications Ordered in ED Medications - No data to display   Initial Impression / Assessment and Plan / ED Course  I have reviewed the triage vital signs and the nursing notes.  Pertinent labs & imaging results that were available during my care of the patient were reviewed by me and considered in my medical decision making (see chart for details).  Clinical Course as of Nov 24 1811  Sun Nov 20, 2016  1515 ED EKG [CG]    Clinical Course User Index [CG] Kinnie Feil, PA-C   55 yo female presents with generalized tingling and pre-syncope episode last <1 min.  Pt reports similar symptoms back in 2011.  Pt denied symptoms while in ED. Pt neuro intact without signs of TIA or stroke.  U/A, CBC, BMP and troponin grossly unremarkable.  EKG showed sinus rhythm, no changes from previous tracing.  Etiology of symptoms unclear, considered TIA and cardiac pathology.  Given pt presentation, normal neuro and cardiac exam, normal lab work up CT head not indicated at this time as I do not think it will provide additional information at this time.  Pt ready for discharge at this time.  Pt instructed to follow up with PCP or  cardiologist for further discussion of symptoms and consideration of further  testing.  Pt given ED return instructions, pt verbalized understanding and agreeable to dispo plan.   Final Clinical Impressions(s) / ED Diagnoses   Final diagnoses:  Tingling  Near syncope    New Prescriptions Discharge Medication List as of 11/20/2016  4:05 PM       Kinnie Feil, PA-C 11/23/16 1812    Daleen Bo, MD 11/23/16 2028

## 2016-11-20 NOTE — Discharge Instructions (Signed)
Your work up today was overall negative today.   Please follow up with primary care doctor for appointment and further discussion of symptoms and consideration of work up, as needed.   Return to emergency room if you develop worsening of symptoms or new numbness, tingling or if you lose consciousness.  Return to emergency room if your symptoms are associated with nausea, vomiting, blurry vision, weakness

## 2016-11-20 NOTE — ED Triage Notes (Addendum)
Pt reports episode this am of feeling numb and tingling all over, felt near syncopal. Pt has cardiac hx. Pt reports that all numbness has resolved pta. Denies cp or sob.

## 2016-11-22 LAB — CUP PACEART REMOTE DEVICE CHECK
Brady Statistic AP VP Percent: 1 %
Brady Statistic AP VS Percent: 2.9 %
Brady Statistic AS VP Percent: 1 %
Brady Statistic AS VS Percent: 97 %
Implantable Lead Implant Date: 20111130
Implantable Pulse Generator Implant Date: 20111130
Lead Channel Impedance Value: 440 Ohm
Lead Channel Pacing Threshold Amplitude: 0.875 V
Lead Channel Pacing Threshold Amplitude: 1.125 V
Lead Channel Pacing Threshold Pulse Width: 0.4 ms
Lead Channel Sensing Intrinsic Amplitude: 4.2 mV
Lead Channel Setting Pacing Amplitude: 1.375
Lead Channel Setting Pacing Pulse Width: 0.4 ms
MDC IDC LEAD IMPLANT DT: 20111130
MDC IDC LEAD LOCATION: 753859
MDC IDC LEAD LOCATION: 753860
MDC IDC MSMT BATTERY REMAINING LONGEVITY: 111 mo
MDC IDC MSMT BATTERY REMAINING PERCENTAGE: 81 %
MDC IDC MSMT BATTERY VOLTAGE: 2.93 V
MDC IDC MSMT LEADCHNL RA IMPEDANCE VALUE: 380 Ohm
MDC IDC MSMT LEADCHNL RA PACING THRESHOLD PULSEWIDTH: 0.4 ms
MDC IDC MSMT LEADCHNL RA SENSING INTR AMPL: 3.6 mV
MDC IDC SESS DTM: 20171101061751
MDC IDC SET LEADCHNL RA PACING AMPLITUDE: 1.875
MDC IDC SET LEADCHNL RV SENSING SENSITIVITY: 2 mV
MDC IDC STAT BRADY RA PERCENT PACED: 2.6 %
MDC IDC STAT BRADY RV PERCENT PACED: 1 %
Pulse Gen Model: 2210
Pulse Gen Serial Number: 7192850

## 2016-12-16 ENCOUNTER — Ambulatory Visit (INDEPENDENT_AMBULATORY_CARE_PROVIDER_SITE_OTHER): Payer: Managed Care, Other (non HMO) | Admitting: Nurse Practitioner

## 2016-12-16 ENCOUNTER — Encounter: Payer: Self-pay | Admitting: Nurse Practitioner

## 2016-12-16 VITALS — BP 126/82 | Ht 66.0 in | Wt 218.8 lb

## 2016-12-16 DIAGNOSIS — K219 Gastro-esophageal reflux disease without esophagitis: Secondary | ICD-10-CM | POA: Diagnosis not present

## 2016-12-16 DIAGNOSIS — F32A Depression, unspecified: Secondary | ICD-10-CM

## 2016-12-16 DIAGNOSIS — Z13220 Encounter for screening for lipoid disorders: Secondary | ICD-10-CM

## 2016-12-16 DIAGNOSIS — E039 Hypothyroidism, unspecified: Secondary | ICD-10-CM

## 2016-12-16 DIAGNOSIS — F329 Major depressive disorder, single episode, unspecified: Secondary | ICD-10-CM

## 2016-12-16 DIAGNOSIS — F419 Anxiety disorder, unspecified: Secondary | ICD-10-CM

## 2016-12-16 DIAGNOSIS — F418 Other specified anxiety disorders: Secondary | ICD-10-CM

## 2016-12-16 DIAGNOSIS — Z79899 Other long term (current) drug therapy: Secondary | ICD-10-CM

## 2016-12-16 DIAGNOSIS — R5383 Other fatigue: Secondary | ICD-10-CM

## 2016-12-16 MED ORDER — ESCITALOPRAM OXALATE 10 MG PO TABS: 10.0000 mg | ORAL_TABLET | Freq: Every day | ORAL | 2 refills | Status: DC

## 2016-12-16 MED ORDER — ALPRAZOLAM 1 MG PO TABS: ORAL_TABLET | ORAL | 5 refills | Status: DC

## 2016-12-18 ENCOUNTER — Other Ambulatory Visit: Payer: Self-pay | Admitting: Family Medicine

## 2016-12-18 ENCOUNTER — Encounter: Payer: Self-pay | Admitting: Nurse Practitioner

## 2016-12-18 NOTE — Progress Notes (Signed)
Subjective:  Presents for follow-up of chronic health issues including anxiety and depression. Stopped Paxil due to significant hot flashes which have since resolved. Would like to try different medication. Does use occasional Xanax. Reflux stable on omeprazole. Mild fatigue. Is due for routine lab work. Went to ED on 12/10 for near syncopal episode. Has follow-up scheduled with her cardiologist.  Objective:   BP 126/82   Ht 5\' 6"  (1.676 m)   Wt 218 lb 12.8 oz (99.2 kg)   BMI 35.32 kg/m  NAD. Alert, oriented. Lungs clear. Heart regular rate rhythm. Abdomen soft nondistended nontender. Thoughts logical coherent and relevant. Cheerful affect. Making good eye contact.  Assessment:   Problem List Items Addressed This Visit      Digestive   GERD (gastroesophageal reflux disease)     Endocrine   Hypothyroidism - Primary   Relevant Orders   TSH     Other   Anxiety and depression    Other Visit Diagnoses    Other fatigue       Relevant Orders   VITAMIN D 25 Hydroxy (Vit-D Deficiency, Fractures)   Screening, lipid       Relevant Orders   Lipid panel   High risk medication use       Relevant Orders   Basic metabolic panel   Hepatic function panel     Plan:  Meds ordered this encounter  Medications  . escitalopram (LEXAPRO) 10 MG tablet    Sig: Take 1 tablet (10 mg total) by mouth daily.    Dispense:  30 tablet    Refill:  2    Order Specific Question:   Supervising Provider    Answer:   Mikey Kirschner [[id number]]  . ALPRAZolam (XANAX) 1 MG tablet    Sig: 1/2 po BID prn anxiety or sleep    Dispense:  30 tablet    Refill:  5    May refill monthly    Order Specific Question:   Supervising Provider    Answer:   Mikey Kirschner [[id number]]   Routine labs ordered. Switch to Lexapro as directed. Continue Xanax as directed. Reviewed potential adverse effects, DC Lexapro and call if any problems. Discussed importance of regular activity healthy diet weight loss and stress reduction.  Reminded patient about preventive health physical.  Signed, Westover Merwin, [place], [facility] 12/18/2016 12:30 PM

## 2016-12-27 LAB — TSH: TSH: 3.19 u[IU]/mL (ref 0.450–4.500)

## 2016-12-27 LAB — BASIC METABOLIC PANEL
BUN / CREAT RATIO: 11 (ref 9–23)
BUN: 10 mg/dL (ref 6–24)
CO2: 23 mmol/L (ref 18–29)
CREATININE: 0.9 mg/dL (ref 0.57–1.00)
Calcium: 9.6 mg/dL (ref 8.7–10.2)
Chloride: 101 mmol/L (ref 96–106)
GFR calc non Af Amer: 73 mL/min/{1.73_m2} (ref >59)
GFR, EST AFRICAN AMERICAN: 84 mL/min/{1.73_m2} (ref >59)
GLUCOSE: 105 mg/dL — AB (ref 65–99)
Potassium: 4 mmol/L (ref 3.5–5.2)
SODIUM: 141 mmol/L (ref 134–144)

## 2016-12-27 LAB — HEPATIC FUNCTION PANEL
ALBUMIN: 4.1 g/dL (ref 3.5–5.5)
ALT: 13 IU/L (ref 0–32)
AST: 13 IU/L (ref 0–40)
Alkaline Phosphatase: 83 IU/L (ref 39–117)
BILIRUBIN TOTAL: 0.3 mg/dL (ref 0.0–1.2)
Bilirubin, Direct: 0.07 mg/dL (ref 0.00–0.40)
TOTAL PROTEIN: 6.6 g/dL (ref 6.0–8.5)

## 2016-12-27 LAB — LIPID PANEL
CHOLESTEROL TOTAL: 256 mg/dL — AB (ref 100–199)
Chol/HDL Ratio: 6.7 ratio units — ABNORMAL HIGH (ref 0.0–4.4)
HDL: 38 mg/dL — ABNORMAL LOW (ref >39)
LDL CALC: 168 mg/dL — AB (ref 0–99)
Triglycerides: 248 mg/dL — ABNORMAL HIGH (ref 0–149)
VLDL CHOLESTEROL CAL: 50 mg/dL — AB (ref 5–40)

## 2016-12-27 LAB — VITAMIN D 25 HYDROXY (VIT D DEFICIENCY, FRACTURES): VIT D 25 HYDROXY: 12.4 ng/mL — AB (ref 30.0–100.0)

## 2017-01-02 ENCOUNTER — Other Ambulatory Visit: Payer: Self-pay | Admitting: Nurse Practitioner

## 2017-01-02 MED ORDER — VITAMIN D (ERGOCALCIFEROL) 1.25 MG (50000 UNIT) PO CAPS: 50000.0000 [IU] | ORAL_CAPSULE | ORAL | 2 refills | Status: DC

## 2017-01-06 ENCOUNTER — Other Ambulatory Visit: Payer: Self-pay | Admitting: Nurse Practitioner

## 2017-01-06 ENCOUNTER — Telehealth: Payer: Self-pay | Admitting: Family Medicine

## 2017-01-06 MED ORDER — SERTRALINE HCL 50 MG PO TABS: 50.0000 mg | ORAL_TABLET | Freq: Every day | ORAL | 2 refills | Status: DC

## 2017-01-06 NOTE — Telephone Encounter (Signed)
I switched her to Zoloft; starting low dose; may need to increase after a week or two; let us know if any side effects or if needs increased dose. She may want to cut Lexapro tab in half and take for a few days then switch to new med.

## 2017-01-06 NOTE — Telephone Encounter (Signed)
Left message to return call 

## 2017-01-06 NOTE — Telephone Encounter (Signed)
Pt is wanting to change her antidepressant to something else. Pt is currently on Lexapro and states that she is still on edge. Please advise. Pt is wanting this sent to [facility].

## 2017-01-06 NOTE — Telephone Encounter (Signed)
Discussed with pt. Pt verbalized understanding.  °

## 2017-01-11 ENCOUNTER — Ambulatory Visit (INDEPENDENT_AMBULATORY_CARE_PROVIDER_SITE_OTHER): Payer: Managed Care, Other (non HMO) | Admitting: *Deleted

## 2017-01-11 DIAGNOSIS — I495 Sick sinus syndrome: Secondary | ICD-10-CM | POA: Diagnosis not present

## 2017-01-11 NOTE — Progress Notes (Signed)
Remote pacemaker transmission.   

## 2017-01-12 ENCOUNTER — Encounter: Payer: Self-pay | Admitting: Cardiology

## 2017-01-23 ENCOUNTER — Telehealth: Payer: Self-pay | Admitting: Nurse Practitioner

## 2017-01-23 LAB — CUP PACEART REMOTE DEVICE CHECK
Battery Voltage: 2.93 V
Brady Statistic AP VP Percent: 1 %
Brady Statistic AS VP Percent: 1 %
Brady Statistic RA Percent Paced: 2 %
Brady Statistic RV Percent Paced: 1 %
Date Time Interrogation Session: 20180131084734
Implantable Lead Implant Date: 20111130
Implantable Lead Location: 753859
Implantable Pulse Generator Implant Date: 20111130
Lead Channel Impedance Value: 410 Ohm
Lead Channel Pacing Threshold Amplitude: 0.875 V
Lead Channel Pacing Threshold Pulse Width: 0.4 ms
Lead Channel Sensing Intrinsic Amplitude: 4.2 mV
Lead Channel Setting Pacing Amplitude: 1.875
MDC IDC LEAD IMPLANT DT: 20111130
MDC IDC LEAD LOCATION: 753860
MDC IDC MSMT BATTERY REMAINING LONGEVITY: 110 mo
MDC IDC MSMT BATTERY REMAINING PERCENTAGE: 81 %
MDC IDC MSMT LEADCHNL RA IMPEDANCE VALUE: 380 Ohm
MDC IDC MSMT LEADCHNL RA SENSING INTR AMPL: 2.6 mV
MDC IDC MSMT LEADCHNL RV PACING THRESHOLD AMPLITUDE: 1.25 V
MDC IDC MSMT LEADCHNL RV PACING THRESHOLD PULSEWIDTH: 0.4 ms
MDC IDC SET LEADCHNL RV PACING AMPLITUDE: 1.5 V
MDC IDC SET LEADCHNL RV PACING PULSEWIDTH: 0.4 ms
MDC IDC SET LEADCHNL RV SENSING SENSITIVITY: 2 mV
MDC IDC STAT BRADY AP VS PERCENT: 2.2 %
MDC IDC STAT BRADY AS VS PERCENT: 98 %
Pulse Gen Model: 2210
Pulse Gen Serial Number: 7192850

## 2017-01-23 NOTE — Telephone Encounter (Addendum)
Patient stated she has been having trouble getting in her my chart account. Advised patient:  Notes Recorded by Nilda Simmer, NP on 01/02/2017 at 5:11 PM EST [name], your sugar is holding steady at prediabetic range. Liver test is normal. Thyroid test much better. Your vitamin D is very low. Will send in Rx for vitamin D once a week for 3 months then start OTC 2000 units per day. Strongly recommend you consider cholesterol medicine to reduce your risk for heart attack or stroke. Your bad cholesterol (LDL) is very high and climbing. Your good cholesterol (LDL) remains low. Let me know what you decide. [name]- Patient verbalized understanding and is going to consider the cholesterol medication and let [name] know what she decides.

## 2017-01-23 NOTE — Telephone Encounter (Signed)
Results with comments were sent through my chart on 1/22. If she cannot view this please see comments on chart and review with her. Thanks.

## 2017-01-23 NOTE — Telephone Encounter (Signed)
Wanting to find out results of bloodwork she had done a couple weeks ago.  New home # is [phone].  Thx

## 2017-01-26 ENCOUNTER — Encounter: Payer: Self-pay | Admitting: Cardiology

## 2017-02-10 ENCOUNTER — Ambulatory Visit (INDEPENDENT_AMBULATORY_CARE_PROVIDER_SITE_OTHER): Payer: Managed Care, Other (non HMO) | Admitting: Nurse Practitioner

## 2017-02-10 ENCOUNTER — Encounter: Payer: Self-pay | Admitting: Nurse Practitioner

## 2017-02-10 ENCOUNTER — Other Ambulatory Visit: Payer: Self-pay | Admitting: Nurse Practitioner

## 2017-02-10 VITALS — BP 132/86 | Ht 66.0 in | Wt 221.8 lb

## 2017-02-10 DIAGNOSIS — M5442 Lumbago with sciatica, left side: Secondary | ICD-10-CM

## 2017-02-10 DIAGNOSIS — E785 Hyperlipidemia, unspecified: Secondary | ICD-10-CM

## 2017-02-10 DIAGNOSIS — Z79899 Other long term (current) drug therapy: Secondary | ICD-10-CM

## 2017-02-10 MED ORDER — ROSUVASTATIN CALCIUM 10 MG PO TABS: 10.0000 mg | ORAL_TABLET | Freq: Every day | ORAL | 2 refills | Status: DC

## 2017-02-11 ENCOUNTER — Encounter: Payer: Self-pay | Admitting: Nurse Practitioner

## 2017-02-11 NOTE — Progress Notes (Signed)
Subjective:  Presents for persistent c/o persistent low back pain for the past 8 months. Last seen for this 10/21/16. Left low back pain going into the buttock down the leg to the knee area. No numbness or weakness of the leg. Worse with sitting. Intense pain at times; difficulty sleeping. Has tried multiple OTC meds with no relief. Applying topical Icy Hot. Pain has been progressive. Has a pacemaker which may limit choices regarding MRI.   Objective:   BP 132/86   Ht 5\' 6"  (1.676 m)   Wt 221 lb 12.8 oz (100.6 kg)   BMI 35.80 kg/m  NAD. Alert, oriented. Lungs clear. Heart RRR. Tenderness in the left lumbar area going into the left buttock. SLR negative bilat. Reflexes normal lower extremities. Gait slow but steady. Patient sitting on right hip to avoid pressure on left side. Patient was sent her recent lipid results and comments. Agrees to start medication.   Assessment:  Acute left-sided low back pain with left-sided sciatica - Plan: CANCELED: MR LUMBAR SPINE WO CONTRAST  Hyperlipidemia, unspecified hyperlipidemia type - Plan: Lipid panel  High risk medication use - Plan: Hepatic function panel .  Plan:   Meds ordered this encounter  Medications  . rosuvastatin (CRESTOR) 10 MG tablet    Sig: Take 1 tablet (10 mg total) by mouth daily. For cholesterol    Dispense:  30 tablet    Refill:  2    Order Specific Question:   Supervising Provider    Answer:   Maggie Font   Start med and repeat labs in 10 weeks. Call back if any problems. Per radiology, a copy of information regarding her pacemaker sent to them to see if MRI is possible.  Further action based on this.

## 2017-02-13 ENCOUNTER — Other Ambulatory Visit: Payer: Self-pay | Admitting: Nurse Practitioner

## 2017-02-13 DIAGNOSIS — M5442 Lumbago with sciatica, left side: Secondary | ICD-10-CM

## 2017-02-24 ENCOUNTER — Other Ambulatory Visit (HOSPITAL_COMMUNITY): Payer: Managed Care, Other (non HMO)

## 2017-02-27 ENCOUNTER — Other Ambulatory Visit: Payer: Self-pay | Admitting: Neurosurgery

## 2017-02-27 DIAGNOSIS — M5416 Radiculopathy, lumbar region: Secondary | ICD-10-CM

## 2017-03-10 ENCOUNTER — Ambulatory Visit
Admission: RE | Admit: 2017-03-10 | Discharge: 2017-03-10 | Disposition: A | Discharge status: Home / Self Care | Payer: Managed Care, Other (non HMO) | Source: Ambulatory Visit | Attending: Neurosurgery | Admitting: Neurosurgery

## 2017-03-10 DIAGNOSIS — M5416 Radiculopathy, lumbar region: Secondary | ICD-10-CM

## 2017-03-10 MED ORDER — IOPAMIDOL (ISOVUE-M 200) INJECTION 41%
15.0000 mL | Freq: Once | INTRAMUSCULAR | Status: AC
  Administered 2017-03-10: 15 mL via INTRATHECAL

## 2017-03-10 NOTE — Progress Notes (Signed)
Pt states she has been off Zoloft for the past 2 days. 

## 2017-03-10 NOTE — Discharge Instructions (Signed)
Myelogram Discharge Instructions  1. Go home and rest quietly for the next 24 hours.  It is important to lie flat for the next 24 hours.  Get up only to go to the restroom.  You may lie in the bed or on a couch on your back, your stomach, your left side or your right side.  You may have one pillow under your head.  You may have pillows between your knees while you are on your side or under your knees while you are on your back.  2. DO NOT drive today.  Recline the seat as far back as it will go, while still wearing your seat belt, on the way home.  3. You may get up to go to the bathroom as needed.  You may sit up for 10 minutes to eat.  You may resume your normal diet and medications unless otherwise indicated.  Drink lots of extra fluids today and tomorrow.  4. The incidence of headache, nausea, or vomiting is about 5% (one in 20 patients).  If you develop a headache, lie flat and drink plenty of fluids until the headache goes away.  Caffeinated beverages may be helpful.  If you develop severe nausea and vomiting or a headache that does not go away with flat bed rest, call [phone].  5. You may resume normal activities after your 24 hours of bed rest is over; however, do not exert yourself strongly or do any heavy lifting tomorrow. If when you get up you have a headache when standing, go back to bed and force fluids for another 24 hours.  6. Call your physician for a follow-up appointment.  The results of your myelogram will be sent directly to your physician by the following day.  7. If you have any questions or if complications develop after you arrive home, please call [phone].  Discharge instructions have been explained to the patient.  The patient, or the person responsible for the patient, fully understands these instructions.       May resume Zoloft on March 11, 2017, after 9:30 am.

## 2017-03-14 ENCOUNTER — Encounter: Payer: Self-pay | Admitting: Nurse Practitioner

## 2017-04-03 ENCOUNTER — Encounter: Payer: Self-pay | Admitting: Nurse Practitioner

## 2017-04-11 ENCOUNTER — Encounter: Payer: Self-pay | Admitting: Nurse Practitioner

## 2017-04-12 ENCOUNTER — Other Ambulatory Visit: Payer: Self-pay | Admitting: Nurse Practitioner

## 2017-04-12 MED ORDER — SERTRALINE HCL 100 MG PO TABS: 100.0000 mg | ORAL_TABLET | Freq: Every day | ORAL | 2 refills | Status: DC

## 2017-04-25 ENCOUNTER — Encounter: Payer: Self-pay | Admitting: Nurse Practitioner

## 2017-05-06 LAB — HEPATIC FUNCTION PANEL
ALBUMIN: 4.1 g/dL (ref 3.5–5.5)
ALK PHOS: 88 IU/L (ref 39–117)
ALT: 19 IU/L (ref 0–32)
AST: 20 IU/L (ref 0–40)
BILIRUBIN TOTAL: 0.3 mg/dL (ref 0.0–1.2)
BILIRUBIN, DIRECT: 0.1 mg/dL (ref 0.00–0.40)
TOTAL PROTEIN: 6.8 g/dL (ref 6.0–8.5)

## 2017-05-06 LAB — LIPID PANEL
CHOL/HDL RATIO: 3.7 ratio (ref 0.0–4.4)
Cholesterol, Total: 161 mg/dL (ref 100–199)
HDL: 43 mg/dL (ref >39)
LDL Calculated: 74 mg/dL (ref 0–99)
Triglycerides: 220 mg/dL — ABNORMAL HIGH (ref 0–149)
VLDL Cholesterol Cal: 44 mg/dL — ABNORMAL HIGH (ref 5–40)

## 2017-05-18 ENCOUNTER — Encounter: Payer: Managed Care, Other (non HMO) | Admitting: Internal Medicine

## 2017-05-18 ENCOUNTER — Encounter: Payer: Self-pay | Admitting: Internal Medicine

## 2017-05-21 ENCOUNTER — Encounter: Payer: Self-pay | Admitting: Nurse Practitioner

## 2017-05-22 ENCOUNTER — Other Ambulatory Visit: Payer: Self-pay | Admitting: Nurse Practitioner

## 2017-05-22 DIAGNOSIS — F17200 Nicotine dependence, unspecified, uncomplicated: Secondary | ICD-10-CM

## 2017-05-24 ENCOUNTER — Other Ambulatory Visit: Payer: Self-pay | Admitting: Nurse Practitioner

## 2017-06-02 ENCOUNTER — Ambulatory Visit (HOSPITAL_COMMUNITY)
Admission: RE | Admit: 2017-06-02 | Discharge: 2017-06-02 | Disposition: A | Discharge status: Home / Self Care | Payer: Managed Care, Other (non HMO) | Source: Ambulatory Visit | Attending: Nurse Practitioner | Admitting: Nurse Practitioner

## 2017-06-02 ENCOUNTER — Other Ambulatory Visit: Payer: Self-pay | Admitting: Nurse Practitioner

## 2017-06-02 DIAGNOSIS — F17200 Nicotine dependence, unspecified, uncomplicated: Secondary | ICD-10-CM | POA: Insufficient documentation

## 2017-06-02 DIAGNOSIS — R05 Cough: Secondary | ICD-10-CM | POA: Insufficient documentation

## 2017-06-02 DIAGNOSIS — Z1231 Encounter for screening mammogram for malignant neoplasm of breast: Secondary | ICD-10-CM

## 2017-06-16 ENCOUNTER — Ambulatory Visit (HOSPITAL_COMMUNITY)
Admission: RE | Admit: 2017-06-16 | Discharge: 2017-06-16 | Disposition: A | Discharge status: Home / Self Care | Payer: Managed Care, Other (non HMO) | Source: Ambulatory Visit | Attending: Nurse Practitioner | Admitting: Nurse Practitioner

## 2017-06-16 DIAGNOSIS — Z1231 Encounter for screening mammogram for malignant neoplasm of breast: Secondary | ICD-10-CM

## 2017-06-30 ENCOUNTER — Ambulatory Visit (INDEPENDENT_AMBULATORY_CARE_PROVIDER_SITE_OTHER): Payer: Managed Care, Other (non HMO) | Admitting: Nurse Practitioner

## 2017-06-30 ENCOUNTER — Encounter: Payer: Self-pay | Admitting: Nurse Practitioner

## 2017-06-30 VITALS — BP 120/82 | Ht 66.0 in | Wt 224.0 lb

## 2017-06-30 DIAGNOSIS — F419 Anxiety disorder, unspecified: Secondary | ICD-10-CM | POA: Diagnosis not present

## 2017-06-30 DIAGNOSIS — M62838 Other muscle spasm: Secondary | ICD-10-CM

## 2017-06-30 DIAGNOSIS — M5442 Lumbago with sciatica, left side: Secondary | ICD-10-CM | POA: Diagnosis not present

## 2017-06-30 DIAGNOSIS — F32A Depression, unspecified: Secondary | ICD-10-CM

## 2017-06-30 DIAGNOSIS — F329 Major depressive disorder, single episode, unspecified: Secondary | ICD-10-CM | POA: Diagnosis not present

## 2017-06-30 MED ORDER — PHENTERMINE HCL 37.5 MG PO TABS: 37.5000 mg | ORAL_TABLET | Freq: Every day | ORAL | 0 refills | Status: DC

## 2017-06-30 MED ORDER — GABAPENTIN 100 MG PO CAPS: 100.0000 mg | ORAL_CAPSULE | Freq: Three times a day (TID) | ORAL | 5 refills | Status: DC

## 2017-06-30 MED ORDER — SERTRALINE HCL 25 MG PO TABS: 25.0000 mg | ORAL_TABLET | Freq: Every day | ORAL | 0 refills | Status: DC

## 2017-06-30 NOTE — Patient Instructions (Addendum)
Massage therapy  Ice/heat biofreeze Adjustment [facility] chiropractor Para March at [place]

## 2017-07-01 ENCOUNTER — Encounter: Payer: Self-pay | Admitting: Nurse Practitioner

## 2017-07-01 NOTE — Progress Notes (Addendum)
Subjective:  Presents to discuss weaning off Zoloft. Doing much better and wants to focus on weight loss. Low dose Neurontin prescribed by specialist works well controlling back pain. Would like for [place] to prescribe at this point. Also c/o muscle spasms of the upper back and neck area. Cannot use TENS unit due to pacemaker.   Objective:   BP 120/82   Ht 5\' 6"  (1.676 m)   Wt 224 lb 0.2 oz (101.6 kg)   BMI 36.16 kg/m  NAD. Alert, oriented. Calm affect. Lungs clear. Heart RRR. Tight tender muscles noted in the upper back and neck area.   Assessment:   Problem List Items Addressed This Visit      Other   Anxiety and depression   Morbid obesity ([place])   Relevant Medications   phentermine (ADIPEX-P) 37.5 MG tablet    Other Visit Diagnoses    Acute left-sided low back pain with left-sided sciatica    -  Primary   Relevant Medications   diclofenac (VOLTAREN) 50 MG EC tablet   Muscle spasms of neck           Plan:   Meds ordered this encounter  Medications  . DISCONTD: gabapentin (NEURONTIN) 100 MG capsule    Sig: Take 100 mg by mouth 3 (three) times daily.  . diclofenac (VOLTAREN) 50 MG EC tablet    Sig: Take 50 mg by mouth at bedtime.  . sertraline (ZOLOFT) 25 MG tablet    Sig: Take 1 tablet (25 mg total) by mouth daily.    Dispense:  30 tablet    Refill:  0    Order Specific Question:   Supervising Provider    Answer:   Mikey Kirschner [[id number]]  . gabapentin (NEURONTIN) 100 MG capsule    Sig: Take 1 capsule (100 mg total) by mouth 3 (three) times daily.    Dispense:  90 capsule    Refill:  5    Order Specific Question:   Supervising Provider    Answer:   Mikey Kirschner [[id number]]  . phentermine (ADIPEX-P) 37.5 MG tablet    Sig: Take 1 tablet (37.5 mg total) by mouth daily before breakfast.    Dispense:  30 tablet    Refill:  0    Order Specific Question:   Supervising Provider    Answer:   Mikey Kirschner [[id number]]   Slowly wean off Zoloft as directed. Call back if any  problems. Once this is complete, start Phentermine as directed. Reviewed potential adverse effects. DC med if any problems. Recheck one month after beginning Phentermine. For muscle spasms: Massage therapy  Ice/heat biofreeze Adjustment Chiropractic adjustment Has a new mattress; consider different pillow

## 2017-07-10 ENCOUNTER — Ambulatory Visit (INDEPENDENT_AMBULATORY_CARE_PROVIDER_SITE_OTHER): Payer: Managed Care, Other (non HMO) | Admitting: Internal Medicine

## 2017-07-10 ENCOUNTER — Encounter: Payer: Self-pay | Admitting: Internal Medicine

## 2017-07-10 VITALS — BP 126/76 | HR 88 | Ht 66.0 in | Wt 224.0 lb

## 2017-07-10 DIAGNOSIS — I495 Sick sinus syndrome: Secondary | ICD-10-CM

## 2017-07-10 LAB — CUP PACEART INCLINIC DEVICE CHECK
Brady Statistic RA Percent Paced: 1.7 %
Brady Statistic RV Percent Paced: 0.08 %
Date Time Interrogation Session: 20180730094554
Implantable Lead Implant Date: 20111130
Implantable Lead Location: 753859
Lead Channel Impedance Value: 437.5 Ohm
Lead Channel Pacing Threshold Amplitude: 1 V
Lead Channel Pacing Threshold Amplitude: 1.5 V
Lead Channel Pacing Threshold Pulse Width: 0.4 ms
Lead Channel Pacing Threshold Pulse Width: 0.4 ms
Lead Channel Pacing Threshold Pulse Width: 0.4 ms
Lead Channel Sensing Intrinsic Amplitude: 4.7 mV
Lead Channel Setting Pacing Amplitude: 1.875
Lead Channel Setting Pacing Pulse Width: 0.4 ms
MDC IDC LEAD IMPLANT DT: 20111130
MDC IDC LEAD LOCATION: 753860
MDC IDC MSMT BATTERY REMAINING LONGEVITY: 116 mo
MDC IDC MSMT BATTERY VOLTAGE: 2.93 V
MDC IDC MSMT LEADCHNL RA IMPEDANCE VALUE: 400 Ohm
MDC IDC MSMT LEADCHNL RA PACING THRESHOLD AMPLITUDE: 1 V
MDC IDC MSMT LEADCHNL RA PACING THRESHOLD PULSEWIDTH: 0.4 ms
MDC IDC MSMT LEADCHNL RV PACING THRESHOLD AMPLITUDE: 1.5 V
MDC IDC MSMT LEADCHNL RV SENSING INTR AMPL: 4.7 mV
MDC IDC PG IMPLANT DT: 20111130
MDC IDC SET LEADCHNL RA PACING AMPLITUDE: 1.75 V
MDC IDC SET LEADCHNL RV SENSING SENSITIVITY: 2 mV
Pulse Gen Model: 2210
Pulse Gen Serial Number: 7192850

## 2017-07-10 NOTE — Progress Notes (Signed)
HPI [name] returns today for followup. She is a very pleasant middle-age woman with symptomatic bradycardia, status post permanent pacemaker insertion, tobacco abuse, and gastroesophageal reflux disease. In the interim, she has been stable.She continues to smoke but is trying to quit and is down to a half pack a day along with the patches. Her brother just died of lung CA.  Allergies  Allergen Reactions  . Codeine   . Sulfonamide Derivatives   . Betadine [Povidone Iodine] Other (See Comments)    Blisters   . Paxil [Paroxetine Hcl] Other (See Comments)    Extreme sweating  . Vioxx [Rofecoxib] Rash     Current Outpatient Prescriptions  Medication Sig Dispense Refill  . ALPRAZolam (XANAX) 1 MG tablet 1/2 po BID prn anxiety or sleep 30 tablet 5  . aspirin 81 MG tablet Take 162 mg by mouth daily.    . Cholecalciferol (VITAMIN D3) 1000 UNITS tablet Take 1,000 Units by mouth daily.      . cyclobenzaprine (FLEXERIL) 10 MG tablet TAKE ONE TABLET BY MOUTH THREE TIMES DAILY AS NEEDED FOR MUSCLE SPASMS 30 tablet 0  . diclofenac (VOLTAREN) 50 MG EC tablet Take 50 mg by mouth at bedtime.    . fluticasone (FLONASE) 50 MCG/ACT nasal spray Place 1 spray into both nostrils daily. 16 g 5  . gabapentin (NEURONTIN) 100 MG capsule Take 1 capsule (100 mg total) by mouth 3 (three) times daily. 90 capsule 5  . levothyroxine (SYNTHROID, LEVOTHROID) 75 MCG tablet TAKE ONE TABLET BY MOUTH ONCE DAILY 90 tablet 1  . omeprazole (PRILOSEC) 20 MG capsule TAKE ONE CAPSULE BY MOUTH TWICE DAILY 60 capsule 5  . phentermine (ADIPEX-P) 37.5 MG tablet Take 1 tablet (37.5 mg total) by mouth daily before breakfast. 30 tablet 0  . rosuvastatin (CRESTOR) 10 MG tablet TAKE 1 TABLET BY MOUTH ONCE DAILY FOR CHOLESTEROL 90 tablet 1  . sertraline (ZOLOFT) 100 MG tablet Take 1 tablet (100 mg total) by mouth daily. 30 tablet 2  . sertraline (ZOLOFT) 25 MG tablet Take 1 tablet (25 mg total) by mouth daily. 30 tablet 0   No current  facility-administered medications for this visit.      Past Medical History:  Diagnosis Date  . Anxiety   . Arthritis   . Depression    bipolar disorder  . Esophageal stricture   . GERD (gastroesophageal reflux disease)   . Heart defect   . HLD (hyperlipidemia)   . Hypothyroidism   . IBS (irritable bowel syndrome)   . IFG (impaired fasting glucose)   . Migraine headache   . OSA (obstructive sleep apnea)   . Reflux   . Sleep apnea   . Symptomatic bradycardia     ROS:   All systems reviewed and negative except as noted in the HPI.   Past Surgical History:  Procedure Laterality Date  . ABDOMINAL HERNIA REPAIR    . BREAST CYST EXCISION     left  . Cesarean sections     x2  . CHOLECYSTECTOMY    . COLONOSCOPY    . dental implants    . foor surgery Right    screws   . FOOT SURGERY     screws in left foot  . PACEMAKER INSERTION    . TUBAL LIGATION    . VAGINAL HYSTERECTOMY       Family History  Problem Relation Age of Onset  . Throat cancer Father        squamous cell carcinoma  .  Tongue cancer Father   . Esophageal cancer Father   . Arthritis Mother   . Heart disease Mother        coronary; female < 35  . Asthma Mother   . Colon cancer Neg Hx      Social History   Social History  . Marital status: Married    Spouse name: N/A  . Number of children: 2  . Years of education: N/A   Occupational History  . retired Advertising copywriter   Social History Main Topics  . Smoking status: Current Every Day Smoker    Packs/day: 1.00    Years: 25.00    Types: Cigarettes  . Smokeless tobacco: Never Used     Comment: tobacco info givin 03/08/16  . Alcohol use No     Comment: Occ.   . Drug use: No  . Sexual activity: Not on file   Other Topics Concern  . Not on file   Social History Narrative   Unemployed. 10 cans of soda per day.      BP 126/76   Pulse 88   Ht 5\' 6"  (1.676 m)   Wt 224 lb (101.6 kg)   SpO2 98%   BMI 36.15 kg/m   Physical Exam:  Well  appearing middle-age woman,NAD HEENT: Unremarkable Neck:  7 cm JVD, no thyromegally Lungs:  Clear with no wheezes, rales, or rhonchi. Well-healed pacemaker incision. HEART:  Regular rate rhythm, no murmurs, no rubs, no clicks Abd:  soft, positive bowel sounds, no organomegally, no rebound, no guarding Ext:  2 plus pulses, no edema, no cyanosis, no clubbing Skin:  No rashes no nodules Neuro:  CN II through XII intact, motor grossly intact  DEVICE  Normal device function.  See PaceArt for details.   Assess/Plan: 1. Sinus node dysfunction - she is stable s/p PPM insertion 2. PAF - PM interogation demonstrates that she has had no episodes of atrial fib. She will undergo watchful waiting. She has never had more than 14 minutes of atrial fib at a time. 3. PM - her St. Jude DDD PM is working normally. Will recheck in several months. 4. Tobacco abuse - I have discussed the importance of stopping smoking. She will continue with the nicotine patches and trying to slowly reduce her dose. 5. Obesity - she has not made much progress at losing weight. I have encouraged her to stop smoking.  [name].D.

## 2017-07-10 NOTE — Patient Instructions (Signed)
Medication Instructions:  Your physician recommends that you continue on your current medications as directed. Please refer to the Current Medication list given to you today.   Labwork: NONE  Testing/Procedures: NONE  Follow-Up: Your physician wants you to follow-up in: 1 Year with Dr. Taylor. You will receive a reminder letter in the mail two months in advance. If you don't receive a letter, please call our office to schedule the follow-up appointment. 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 This monitoring reduces the number of office visits required to check your device to one time per year. It allows us to keep an eye on the functioning of your device to ensure it is working properly. You are scheduled for a device check from home on 10/09/17. You may send your transmission at any time that day. If you have a wireless device, the transmission will be sent automatically. After your physician reviews your transmission, you will receive a postcard with your next transmission date.    Any Other Special Instructions Will Be Listed Below (If Applicable).     If you need a refill on your cardiac medications before your next appointment, please call your pharmacy.  Thank you for choosing [facility]!   

## 2017-07-12 ENCOUNTER — Other Ambulatory Visit: Payer: Self-pay | Admitting: Nurse Practitioner

## 2017-07-17 ENCOUNTER — Encounter: Payer: Self-pay | Admitting: Nurse Practitioner

## 2017-07-26 ENCOUNTER — Encounter: Payer: Self-pay | Admitting: Nurse Practitioner

## 2017-08-02 ENCOUNTER — Other Ambulatory Visit: Payer: Self-pay | Admitting: Family Medicine

## 2017-10-06 ENCOUNTER — Other Ambulatory Visit: Payer: Self-pay | Admitting: Nurse Practitioner

## 2017-10-06 ENCOUNTER — Encounter: Payer: Self-pay | Admitting: Nurse Practitioner

## 2017-10-06 MED ORDER — SERTRALINE HCL 50 MG PO TABS: 50.0000 mg | ORAL_TABLET | Freq: Every day | ORAL | 2 refills | Status: DC

## 2017-10-09 ENCOUNTER — Ambulatory Visit (INDEPENDENT_AMBULATORY_CARE_PROVIDER_SITE_OTHER): Payer: Managed Care, Other (non HMO) | Admitting: *Deleted

## 2017-10-09 DIAGNOSIS — I495 Sick sinus syndrome: Secondary | ICD-10-CM | POA: Diagnosis not present

## 2017-10-09 NOTE — Progress Notes (Signed)
Remote pacemaker transmission.   

## 2017-10-12 LAB — CUP PACEART REMOTE DEVICE CHECK
Brady Statistic AP VP Percent: 1 %
Brady Statistic AS VP Percent: 1 %
Brady Statistic RA Percent Paced: 1.6 %
Brady Statistic RV Percent Paced: 1 %
Date Time Interrogation Session: 20181029061628
Implantable Lead Location: 753859
Implantable Lead Location: 753860
Implantable Pulse Generator Implant Date: 20111130
Lead Channel Impedance Value: 410 Ohm
Lead Channel Pacing Threshold Pulse Width: 0.4 ms
Lead Channel Sensing Intrinsic Amplitude: 4.3 mV
Lead Channel Setting Pacing Amplitude: 1.5 V
Lead Channel Setting Pacing Amplitude: 1.875
MDC IDC LEAD IMPLANT DT: 20111130
MDC IDC LEAD IMPLANT DT: 20111130
MDC IDC MSMT BATTERY REMAINING LONGEVITY: 98 mo
MDC IDC MSMT BATTERY REMAINING PERCENTAGE: 73 %
MDC IDC MSMT BATTERY VOLTAGE: 2.92 V
MDC IDC MSMT LEADCHNL RA IMPEDANCE VALUE: 390 Ohm
MDC IDC MSMT LEADCHNL RA PACING THRESHOLD AMPLITUDE: 0.875 V
MDC IDC MSMT LEADCHNL RA SENSING INTR AMPL: 2.8 mV
MDC IDC MSMT LEADCHNL RV PACING THRESHOLD AMPLITUDE: 1.25 V
MDC IDC MSMT LEADCHNL RV PACING THRESHOLD PULSEWIDTH: 0.4 ms
MDC IDC SET LEADCHNL RV PACING PULSEWIDTH: 0.4 ms
MDC IDC SET LEADCHNL RV SENSING SENSITIVITY: 2 mV
MDC IDC STAT BRADY AP VS PERCENT: 1.7 %
MDC IDC STAT BRADY AS VS PERCENT: 98 %
Pulse Gen Model: 2210
Pulse Gen Serial Number: 7192850

## 2017-10-17 ENCOUNTER — Ambulatory Visit (INDEPENDENT_AMBULATORY_CARE_PROVIDER_SITE_OTHER): Payer: Managed Care, Other (non HMO)

## 2017-10-17 ENCOUNTER — Encounter: Payer: Self-pay | Admitting: Cardiology

## 2017-10-17 DIAGNOSIS — Z23 Encounter for immunization: Secondary | ICD-10-CM | POA: Diagnosis not present

## 2017-11-06 ENCOUNTER — Encounter: Payer: Self-pay | Admitting: Nurse Practitioner

## 2017-11-08 ENCOUNTER — Other Ambulatory Visit: Payer: Self-pay | Admitting: Nurse Practitioner

## 2017-11-16 ENCOUNTER — Other Ambulatory Visit: Payer: Self-pay | Admitting: Nurse Practitioner

## 2017-11-16 MED ORDER — SERTRALINE HCL 100 MG PO TABS: 100.0000 mg | ORAL_TABLET | Freq: Every day | ORAL | 1 refills | Status: DC

## 2018-01-08 ENCOUNTER — Ambulatory Visit (INDEPENDENT_AMBULATORY_CARE_PROVIDER_SITE_OTHER): Payer: Managed Care, Other (non HMO) | Admitting: *Deleted

## 2018-01-08 DIAGNOSIS — I495 Sick sinus syndrome: Secondary | ICD-10-CM

## 2018-01-08 NOTE — Progress Notes (Signed)
Remote pacemaker transmission.   

## 2018-01-10 ENCOUNTER — Encounter: Payer: Self-pay | Admitting: Cardiology

## 2018-01-10 LAB — CUP PACEART REMOTE DEVICE CHECK
Battery Remaining Longevity: 87 mo
Battery Remaining Percentage: 65 %
Battery Voltage: 2.9 V
Brady Statistic AS VS Percent: 98 %
Brady Statistic RA Percent Paced: 1.5 %
Date Time Interrogation Session: 20190128085049
Implantable Lead Implant Date: 20111130
Implantable Lead Location: 753859
Implantable Lead Location: 753860
Implantable Pulse Generator Implant Date: 20111130
Lead Channel Pacing Threshold Amplitude: 0.875 V
Lead Channel Pacing Threshold Pulse Width: 0.4 ms
Lead Channel Pacing Threshold Pulse Width: 0.4 ms
Lead Channel Sensing Intrinsic Amplitude: 3.3 mV
Lead Channel Setting Pacing Amplitude: 1.5 V
MDC IDC LEAD IMPLANT DT: 20111130
MDC IDC MSMT LEADCHNL RA IMPEDANCE VALUE: 360 Ohm
MDC IDC MSMT LEADCHNL RV IMPEDANCE VALUE: 400 Ohm
MDC IDC MSMT LEADCHNL RV PACING THRESHOLD AMPLITUDE: 1.25 V
MDC IDC MSMT LEADCHNL RV SENSING INTR AMPL: 4.1 mV
MDC IDC SET LEADCHNL RA PACING AMPLITUDE: 1.875
MDC IDC SET LEADCHNL RV PACING PULSEWIDTH: 0.4 ms
MDC IDC SET LEADCHNL RV SENSING SENSITIVITY: 2 mV
MDC IDC STAT BRADY AP VP PERCENT: 1 %
MDC IDC STAT BRADY AP VS PERCENT: 1.6 %
MDC IDC STAT BRADY AS VP PERCENT: 1 %
MDC IDC STAT BRADY RV PERCENT PACED: 1 %
Pulse Gen Serial Number: 7192850

## 2018-01-16 ENCOUNTER — Ambulatory Visit (INDEPENDENT_AMBULATORY_CARE_PROVIDER_SITE_OTHER): Payer: Managed Care, Other (non HMO) | Admitting: Family Medicine

## 2018-01-16 ENCOUNTER — Encounter: Payer: Self-pay | Admitting: Family Medicine

## 2018-01-16 VITALS — BP 132/88 | Temp 98.0°F | Ht 66.0 in | Wt 220.4 lb

## 2018-01-16 DIAGNOSIS — J310 Chronic rhinitis: Secondary | ICD-10-CM

## 2018-01-16 DIAGNOSIS — J329 Chronic sinusitis, unspecified: Secondary | ICD-10-CM

## 2018-01-16 MED ORDER — HYDROCORTISONE 2.5 % EX CREA: TOPICAL_CREAM | Freq: Two times a day (BID) | CUTANEOUS | 2 refills | Status: DC

## 2018-01-16 MED ORDER — AMOXICILLIN-POT CLAVULANATE 875-125 MG PO TABS: 1.0000 | ORAL_TABLET | Freq: Two times a day (BID) | ORAL | 0 refills | Status: DC

## 2018-01-16 NOTE — Progress Notes (Signed)
   Subjective:    Patient ID: [name], female    DOB: [birth year], 57 y.o.   MRN: [id number]  HPI  Patient in today for sore throat, cough, sneezing, runny nose, headache, congestion in head and chest. Has been going on for 1 week. Pt has tried Afrin Nasal Spray, Sudafed  And throat lozenges with no relief.  Congestion and cough   Noticed sneezing then coughing set in  Now dong in the ead and chest  Taking sudafed chloraseptic  And aftien l  Some headache, frontal , no noticeable fever, but dome cills nd sweat  Review of Systems No headache, no major weight loss or weight gain, no chest pain no back pain abdominal pain no change in bowel habits complete ROS otherwise negative     Objective:   Physical Exam  Alert, mild malaise. Hydration good Vitals stable. frontal/ maxillary tenderness evident positive nasal congestion. pharynx normal neck supple  lungs clear/no crackles or wheezes. heart regular in rhythm       Assessment & Plan:  Impression rhinosinusitis likely post viral, discussed with patient. plan antibiotics prescribed. Questions answered. Symptomatic care discussed. warning signs discussed. WSL

## 2018-02-21 ENCOUNTER — Other Ambulatory Visit: Payer: Self-pay | Admitting: Nurse Practitioner

## 2018-04-09 ENCOUNTER — Ambulatory Visit (INDEPENDENT_AMBULATORY_CARE_PROVIDER_SITE_OTHER): Payer: Managed Care, Other (non HMO) | Admitting: *Deleted

## 2018-04-09 DIAGNOSIS — I495 Sick sinus syndrome: Secondary | ICD-10-CM

## 2018-04-09 NOTE — Progress Notes (Signed)
Remote pacemaker transmission.   

## 2018-04-10 ENCOUNTER — Encounter: Payer: Self-pay | Admitting: Cardiology

## 2018-04-25 ENCOUNTER — Other Ambulatory Visit: Payer: Self-pay | Admitting: Family Medicine

## 2018-04-25 NOTE — Telephone Encounter (Signed)
Ok times one, need chronic o v soon

## 2018-04-27 LAB — CUP PACEART REMOTE DEVICE CHECK
Battery Remaining Longevity: 86 mo
Battery Voltage: 2.9 V
Brady Statistic AP VP Percent: 1 %
Brady Statistic RA Percent Paced: 1.4 %
Implantable Lead Implant Date: 20111130
Implantable Lead Location: 753859
Implantable Pulse Generator Implant Date: 20111130
Lead Channel Impedance Value: 400 Ohm
Lead Channel Pacing Threshold Amplitude: 0.75 V
Lead Channel Pacing Threshold Pulse Width: 0.4 ms
Lead Channel Sensing Intrinsic Amplitude: 3.9 mV
Lead Channel Sensing Intrinsic Amplitude: 4.6 mV
Lead Channel Setting Pacing Amplitude: 1.75 V
Lead Channel Setting Sensing Sensitivity: 2 mV
MDC IDC LEAD IMPLANT DT: 20111130
MDC IDC LEAD LOCATION: 753860
MDC IDC MSMT BATTERY REMAINING PERCENTAGE: 65 %
MDC IDC MSMT LEADCHNL RV IMPEDANCE VALUE: 410 Ohm
MDC IDC MSMT LEADCHNL RV PACING THRESHOLD AMPLITUDE: 2 V
MDC IDC MSMT LEADCHNL RV PACING THRESHOLD PULSEWIDTH: 0.4 ms
MDC IDC SESS DTM: 20190429072135
MDC IDC SET LEADCHNL RV PACING AMPLITUDE: 2.25 V
MDC IDC SET LEADCHNL RV PACING PULSEWIDTH: 0.4 ms
MDC IDC STAT BRADY AP VS PERCENT: 1.6 %
MDC IDC STAT BRADY AS VP PERCENT: 1 %
MDC IDC STAT BRADY AS VS PERCENT: 98 %
MDC IDC STAT BRADY RV PERCENT PACED: 1 %
Pulse Gen Serial Number: 7192850

## 2018-06-05 ENCOUNTER — Telehealth: Payer: Self-pay | Admitting: Family Medicine

## 2018-06-07 ENCOUNTER — Ambulatory Visit (INDEPENDENT_AMBULATORY_CARE_PROVIDER_SITE_OTHER): Payer: Managed Care, Other (non HMO) | Admitting: Internal Medicine

## 2018-06-07 ENCOUNTER — Encounter: Payer: Self-pay | Admitting: Internal Medicine

## 2018-06-07 VITALS — BP 128/80 | HR 95 | Ht 66.0 in | Wt 225.0 lb

## 2018-06-07 DIAGNOSIS — I495 Sick sinus syndrome: Secondary | ICD-10-CM | POA: Diagnosis not present

## 2018-06-07 DIAGNOSIS — I442 Atrioventricular block, complete: Secondary | ICD-10-CM | POA: Diagnosis not present

## 2018-06-07 DIAGNOSIS — Z950 Presence of cardiac pacemaker: Secondary | ICD-10-CM

## 2018-06-07 LAB — CUP PACEART INCLINIC DEVICE CHECK
Battery Voltage: 2.92 V
Date Time Interrogation Session: 20190627101901
Implantable Lead Implant Date: 20111130
Implantable Lead Implant Date: 20111130
Implantable Lead Location: 753860
Implantable Pulse Generator Implant Date: 20111130
Lead Channel Impedance Value: 400 Ohm
Lead Channel Impedance Value: 412.5 Ohm
Lead Channel Pacing Threshold Amplitude: 1.875 V
Lead Channel Pacing Threshold Pulse Width: 0.4 ms
Lead Channel Pacing Threshold Pulse Width: 0.4 ms
Lead Channel Sensing Intrinsic Amplitude: 4.2 mV
Lead Channel Sensing Intrinsic Amplitude: 5 mV
Lead Channel Setting Pacing Amplitude: 1.875
Lead Channel Setting Sensing Sensitivity: 2 mV
MDC IDC LEAD LOCATION: 753859
MDC IDC MSMT BATTERY REMAINING LONGEVITY: 104 mo
MDC IDC MSMT LEADCHNL RA PACING THRESHOLD AMPLITUDE: 0.875 V
MDC IDC SET LEADCHNL RV PACING AMPLITUDE: 2.125
MDC IDC SET LEADCHNL RV PACING PULSEWIDTH: 0.4 ms
MDC IDC STAT BRADY RA PERCENT PACED: 1.3 %
MDC IDC STAT BRADY RV PERCENT PACED: 0.08 %
Pulse Gen Model: 2210
Pulse Gen Serial Number: 7192850

## 2018-06-07 NOTE — Patient Instructions (Signed)
Medication Instructions:  Your physician recommends that you continue on your current medications as directed. Please refer to the Current Medication list given to you today.   Labwork: NONE  Testing/Procedures: NONE   Follow-Up: Your physician wants you to follow-up in: 1 Year. You will receive a reminder letter in the mail two months in advance. If you don't receive a letter, please call our office to schedule the follow-up appointment.  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 This monitoring reduces the number of office visits required to check your device to one time per year. It allows [place] to keep an eye on the functioning of your device to ensure it is working properly. You are scheduled for a device check from home on 07/09/18. You may send your transmission at any time that day. If you have a wireless device, the transmission will be sent automatically. After your physician reviews your transmission, you will receive a postcard with your next transmission date.   Any Other Special Instructions Will Be Listed Below (If Applicable).     If you need a refill on your cardiac medications before your next appointment, please call your pharmacy.

## 2018-06-07 NOTE — Progress Notes (Signed)
HPI [name] returns today for followup. She is a very pleasant middle-age woman with symptomatic bradycardia, status post permanent pacemaker insertion, tobacco abuse, and gastroesophageal reflux disease. In the interim, she has been stable.She is caring for her grandchildren this summer. Denies chest pain or sob.   Allergies  Allergen Reactions  . Codeine   . Sulfonamide Derivatives   . Betadine [Povidone Iodine] Other (See Comments)    Blisters   . Paxil [Paroxetine Hcl] Other (See Comments)    Extreme sweating  . Vioxx [Rofecoxib] Rash     Current Outpatient Medications  Medication Sig Dispense Refill  . ALPRAZolam (XANAX) 1 MG tablet TAKE ONE-HALF TABLET BY MOUTH TWICE DAILY AS NEEDED FOR ANXIETY OR SLEEP 30 tablet 5  . amoxicillin-clavulanate (AUGMENTIN) 875-125 MG tablet Take 1 tablet by mouth 2 (two) times daily. 20 tablet 0  . aspirin 81 MG tablet Take 162 mg by mouth daily.    . Cholecalciferol (VITAMIN D3) 1000 UNITS tablet Take 1,000 Units by mouth daily.      . cyclobenzaprine (FLEXERIL) 10 MG tablet TAKE ONE TABLET BY MOUTH THREE TIMES DAILY AS NEEDED FOR MUSCLE SPASMS 30 tablet 0  . diclofenac (VOLTAREN) 50 MG EC tablet Take 50 mg by mouth at bedtime.    . fluticasone (FLONASE) 50 MCG/ACT nasal spray Place 1 spray into both nostrils daily. 16 g 5  . gabapentin (NEURONTIN) 100 MG capsule Take 1 capsule (100 mg total) by mouth 3 (three) times daily. 90 capsule 5  . hydrocortisone 2.5 % cream Apply topically 2 (two) times daily. 30 g 2  . levothyroxine (SYNTHROID, LEVOTHROID) 75 MCG tablet TAKE 1 TABLET BY MOUTH ONCE DAILY NEEDS  OFFICE  VISIT 15 tablet 0  . omeprazole (PRILOSEC) 20 MG capsule TAKE ONE CAPSULE BY MOUTH TWICE DAILY 60 capsule 5  . phentermine (ADIPEX-P) 37.5 MG tablet Take 1 tablet (37.5 mg total) by mouth daily before breakfast. 30 tablet 0  . sertraline (ZOLOFT) 100 MG tablet Take 1 tablet (100 mg total) by mouth daily. 90 tablet 1   No  current facility-administered medications for this visit.      Past Medical History:  Diagnosis Date  . Anxiety   . Arthritis   . Depression    bipolar disorder  . Esophageal stricture   . GERD (gastroesophageal reflux disease)   . Heart defect   . HLD (hyperlipidemia)   . Hypothyroidism   . IBS (irritable bowel syndrome)   . IFG (impaired fasting glucose)   . Migraine headache   . OSA (obstructive sleep apnea)   . Reflux   . Sleep apnea   . Symptomatic bradycardia     ROS:   All systems reviewed and negative except as noted in the HPI.   Past Surgical History:  Procedure Laterality Date  . ABDOMINAL HERNIA REPAIR    . BREAST CYST EXCISION     left  . Cesarean sections     x2  . CHOLECYSTECTOMY    . COLONOSCOPY    . dental implants    . foor surgery Right    screws   . FOOT SURGERY     screws in left foot  . PACEMAKER INSERTION    . TUBAL LIGATION    . VAGINAL HYSTERECTOMY       Family History  Problem Relation Age of Onset  . Throat cancer Father        squamous cell carcinoma  . Tongue cancer  Father   . Esophageal cancer Father   . Arthritis Mother   . Heart disease Mother        coronary; female < 26  . Asthma Mother   . Colon cancer Neg Hx      Social History   Socioeconomic History  . Marital status: Married    Spouse name: Not on file  . Number of children: 2  . Years of education: Not on file  . Highest education level: Not on file  Occupational History  . Occupation: retired    Fish farm manager: [employer]  Social Needs  . Financial resource strain: Not on file  . Food insecurity:    Worry: Not on file    Inability: Not on file  . Transportation needs:    Medical: Not on file    Non-medical: Not on file  Tobacco Use  . Smoking status: Current Every Day Smoker    Packs/day: 1.00    Years: 25.00    Pack years: 25.00    Types: Cigarettes  . Smokeless tobacco: Never Used  . Tobacco comment: tobacco info givin 03/08/16  Substance and  Sexual Activity  . Alcohol use: No    Alcohol/week: 0.0 oz    Comment: Occ.   . Drug use: No  . Sexual activity: Not on file  Lifestyle  . Physical activity:    Days per week: Not on file    Minutes per session: Not on file  . Stress: Not on file  Relationships  . Social connections:    Talks on phone: Not on file    Gets together: Not on file    Attends religious service: Not on file    Active member of club or organization: Not on file    Attends meetings of clubs or organizations: Not on file    Relationship status: Not on file  . Intimate partner violence:    Fear of current or ex partner: Not on file    Emotionally abused: Not on file    Physically abused: Not on file    Forced sexual activity: Not on file  Other Topics Concern  . Not on file  Social History Narrative   Unemployed. 10 cans of soda per day.      BP 128/80 (BP Location: Left Arm)   Pulse 95   Ht 5\' 6"  (1.676 m)   Wt 225 lb (102.1 kg)   SpO2 96%   BMI 36.32 kg/m   Physical Exam:  Well appearing 57 yo woman, NAD HEENT: Unremarkable Neck:  6 cm JVD, no thyromegally Lymphatics:  No adenopathy Back:  No CVA tenderness Lungs:  Clear with no wheezes HEART:  Regular rate rhythm, no murmurs, no rubs, no clicks Abd:  soft, positive bowel sounds, no organomegally, no rebound, no guarding Ext:  2 plus pulses, no edema, no cyanosis, no clubbing Skin:  No rashes no nodules Neuro:  CN II through XII intact, motor grossly intact  EKG - nsr  DEVICE  Normal device function.  See PaceArt for details.   Assess/Plan: 1. Sinus node dysfunction - she is asymptomatic, s/p PPM insertion 2. PPM - her St. Jude DDD PPM is working normally. Will recheck in several months. 3. HTN - her pressures are controlled. I encouraged her to work on losing weight.  [name].D.

## 2018-06-11 ENCOUNTER — Other Ambulatory Visit: Payer: Self-pay | Admitting: Nurse Practitioner

## 2018-06-11 DIAGNOSIS — E559 Vitamin D deficiency, unspecified: Secondary | ICD-10-CM

## 2018-06-11 DIAGNOSIS — R5383 Other fatigue: Secondary | ICD-10-CM

## 2018-06-11 DIAGNOSIS — E039 Hypothyroidism, unspecified: Secondary | ICD-10-CM

## 2018-06-11 DIAGNOSIS — E785 Hyperlipidemia, unspecified: Secondary | ICD-10-CM

## 2018-06-11 DIAGNOSIS — Z79899 Other long term (current) drug therapy: Secondary | ICD-10-CM

## 2018-06-11 NOTE — Telephone Encounter (Signed)
It would be great if she could so we could discuss at her visit. Met 7, lipid, liver, TSH (hypothyroid), vit D (vitamin D deficiency)

## 2018-06-11 NOTE — Telephone Encounter (Signed)
Labs placed and pt notified

## 2018-06-11 NOTE — Telephone Encounter (Signed)
Pt has appt on 06/15/18 for thyroid med refill. She is wanting to know if it would be best to get her lab work done before that appt.

## 2018-06-13 LAB — BASIC METABOLIC PANEL
BUN/Creatinine Ratio: 7 — ABNORMAL LOW (ref 9–23)
BUN: 7 mg/dL (ref 6–24)
CALCIUM: 9.8 mg/dL (ref 8.7–10.2)
CO2: 24 mmol/L (ref 20–29)
CREATININE: 0.95 mg/dL (ref 0.57–1.00)
Chloride: 101 mmol/L (ref 96–106)
GFR calc Af Amer: 78 mL/min/{1.73_m2} (ref >59)
GFR calc non Af Amer: 68 mL/min/{1.73_m2} (ref >59)
Glucose: 108 mg/dL — ABNORMAL HIGH (ref 65–99)
Potassium: 4.1 mmol/L (ref 3.5–5.2)
Sodium: 140 mmol/L (ref 134–144)

## 2018-06-13 LAB — HEPATIC FUNCTION PANEL
ALK PHOS: 80 IU/L (ref 39–117)
ALT: 19 IU/L (ref 0–32)
AST: 18 IU/L (ref 0–40)
Albumin: 4.4 g/dL (ref 3.5–5.5)
Bilirubin Total: 0.4 mg/dL (ref 0.0–1.2)
Bilirubin, Direct: 0.12 mg/dL (ref 0.00–0.40)
TOTAL PROTEIN: 7 g/dL (ref 6.0–8.5)

## 2018-06-13 LAB — VITAMIN D 25 HYDROXY (VIT D DEFICIENCY, FRACTURES): Vit D, 25-Hydroxy: 20.4 ng/mL — ABNORMAL LOW (ref 30.0–100.0)

## 2018-06-13 LAB — LIPID PANEL
CHOLESTEROL TOTAL: 301 mg/dL — AB (ref 100–199)
Chol/HDL Ratio: 7.3 ratio — ABNORMAL HIGH (ref 0.0–4.4)
HDL: 41 mg/dL (ref >39)
LDL Calculated: 207 mg/dL — ABNORMAL HIGH (ref 0–99)
Triglycerides: 263 mg/dL — ABNORMAL HIGH (ref 0–149)
VLDL Cholesterol Cal: 53 mg/dL — ABNORMAL HIGH (ref 5–40)

## 2018-06-13 LAB — TSH: TSH: 1.62 u[IU]/mL (ref 0.450–4.500)

## 2018-06-15 ENCOUNTER — Encounter: Payer: Self-pay | Admitting: Family Medicine

## 2018-06-15 ENCOUNTER — Ambulatory Visit (INDEPENDENT_AMBULATORY_CARE_PROVIDER_SITE_OTHER): Payer: Managed Care, Other (non HMO) | Admitting: Family Medicine

## 2018-06-15 VITALS — BP 118/72 | Ht 66.0 in | Wt 223.0 lb

## 2018-06-15 DIAGNOSIS — E785 Hyperlipidemia, unspecified: Secondary | ICD-10-CM

## 2018-06-15 DIAGNOSIS — F329 Major depressive disorder, single episode, unspecified: Secondary | ICD-10-CM

## 2018-06-15 DIAGNOSIS — F419 Anxiety disorder, unspecified: Secondary | ICD-10-CM

## 2018-06-15 DIAGNOSIS — E039 Hypothyroidism, unspecified: Secondary | ICD-10-CM

## 2018-06-15 DIAGNOSIS — F32A Depression, unspecified: Secondary | ICD-10-CM

## 2018-06-15 DIAGNOSIS — E559 Vitamin D deficiency, unspecified: Secondary | ICD-10-CM | POA: Diagnosis not present

## 2018-06-15 MED ORDER — ALPRAZOLAM 1 MG PO TABS: ORAL_TABLET | ORAL | 5 refills | Status: DC

## 2018-06-15 MED ORDER — FLUTICASONE PROPIONATE 50 MCG/ACT NA SUSP: 1.0000 | Freq: Every day | NASAL | 5 refills | Status: DC

## 2018-06-15 MED ORDER — ROSUVASTATIN CALCIUM 10 MG PO TABS: ORAL_TABLET | ORAL | 1 refills | Status: DC

## 2018-06-15 NOTE — Progress Notes (Signed)
Subjective:    Patient ID: [name], female    DOB: 02-26-1961, 57 y.o.   MRN: [id number]  HPIhypothyroidism. Med check up. Takes levothyroxine 75 mcg.  Compliant with meds ongoing challenges with fatigue.  Ongoing depression and anxiety.  We see PHQ 9 results.  Fair control though not ideal.  Exacerbated by trying to take care of family children  Requesting refill on alprazolam and, helpful for insomnia and anxiety requests refill and on  Flonase.  Needs it.  Helps her allergies  Results for orders placed or performed in visit on 06/11/18  Vitamin D (25 hydroxy)  Result Value Ref Range   Vit D, 25-Hydroxy 20.4 (L) 30.0 - 100.0 ng/mL  TSH  Result Value Ref Range   TSH 1.620 0.450 - 4.500 uIU/mL  Hepatic function panel  Result Value Ref Range   Total Protein 7.0 6.0 - 8.5 g/dL   Albumin 4.4 3.5 - 5.5 g/dL   Bilirubin Total 0.4 0.0 - 1.2 mg/dL   Bilirubin, Direct 0.12 0.00 - 0.40 mg/dL   Alkaline Phosphatase 80 39 - 117 IU/L   AST 18 0 - 40 IU/L   ALT 19 0 - 32 IU/L  Lipid Profile  Result Value Ref Range   Cholesterol, Total 301 (H) 100 - 199 mg/dL   Triglycerides 263 (H) 0 - 149 mg/dL   HDL 41 >39 mg/dL   VLDL Cholesterol Cal 53 (H) 5 - 40 mg/dL   LDL Calculated 207 (H) 0 - 99 mg/dL   Comment: Comment    Chol/HDL Ratio 7.3 (H) 0.0 - 4.4 ratio  Basic Metabolic Panel (BMET)  Result Value Ref Range   Glucose 108 (H) 65 - 99 mg/dL   BUN 7 6 - 24 mg/dL   Creatinine, Ser 0.95 0.57 - 1.00 mg/dL   GFR calc non Af Amer 68 >59 mL/min/1.73   GFR calc Af Amer 78 >59 mL/min/1.73   BUN/Creatinine Ratio 7 (L) 9 - 23   Sodium 140 134 - 144 mmol/L   Potassium 4.1 3.5 - 5.2 mmol/L   Chloride 101 96 - 106 mmol/L   CO2 24 20 - 29 mmol/L   Calcium 9.8 8.7 - 10.2 mg/dL   Lipid up very high, came off ned as direction of dermatologist be ause worried about lupus.now  Lupus has been ruled out.  For reasons uncertain the dermatologist stop the patient's statin.  Now unfortunately her  numbers extremely hot  Review of Systems No headache, no major weight loss or weight gain, no chest pain no back pain abdominal pain no change in bowel habits complete ROS otherwise negative     Objective:   Physical Exam Alert and oriented, vitals reviewed and stable, NAD ENT-TM's and ext canals WNL bilat via otoscopic exam Soft palate, tonsils and post pharynx WNL via oropharyngeal exam Neck-symmetric, no masses; thyroid nonpalpable and nontender Pulmonary-no tachypnea or accessory muscle use; Clear without wheezes via auscultation Card--no abnrml murmurs, rhythm reg and rate WNL Carotid pulses symmetric, without bruits        Assessment & Plan:  1 hypothyroidism controlled good to maintain same dose rationale discussed  2.  Hyperlipidemia.  Control poor.  Patient's meds.be a dermatologist.  We will resume importance discussed  3.  Chronic depression anxiety insomnia ongoing.  Medication refilled exercise encouraged.  4.  Allergic rhinitis Flonase refill  Greater than 50% of this 25 minute face to face visit was spent in counseling and discussion and coordination of care regarding  the above diagnosis/diagnosies   Blood work reviewed follow-up 6 months

## 2018-06-23 ENCOUNTER — Other Ambulatory Visit: Payer: Self-pay | Admitting: Family Medicine

## 2018-06-25 ENCOUNTER — Other Ambulatory Visit: Payer: Self-pay | Admitting: Nurse Practitioner

## 2018-06-25 MED ORDER — VITAMIN D (ERGOCALCIFEROL) 1.25 MG (50000 UNIT) PO CAPS: 50000.0000 [IU] | ORAL_CAPSULE | ORAL | 2 refills | Status: DC

## 2018-07-09 ENCOUNTER — Ambulatory Visit (INDEPENDENT_AMBULATORY_CARE_PROVIDER_SITE_OTHER): Payer: Managed Care, Other (non HMO) | Admitting: *Deleted

## 2018-07-09 DIAGNOSIS — I495 Sick sinus syndrome: Secondary | ICD-10-CM | POA: Diagnosis not present

## 2018-07-09 NOTE — Progress Notes (Signed)
Remote pacemaker transmission.   

## 2018-07-10 ENCOUNTER — Encounter: Payer: Self-pay | Admitting: Cardiology

## 2018-07-14 ENCOUNTER — Other Ambulatory Visit: Payer: Self-pay | Admitting: Family Medicine

## 2018-07-16 ENCOUNTER — Other Ambulatory Visit: Payer: Self-pay

## 2018-07-16 MED ORDER — LEVOTHYROXINE SODIUM 75 MCG PO TABS: ORAL_TABLET | ORAL | 5 refills | Status: DC

## 2018-08-03 IMAGING — XA DG MYELOGRAPHY LUMBAR INJ LUMBOSACRAL
14 of 20 series · 14 of 20 positions shown · non-contrast
Comparison: None.

CLINICAL DATA: Low back pain. LEFT-sided leg pain, which began
without inciting event. LEFT-sided L3 radiculopathy. Pacemaker.
TECHNIQUE: Contiguous axial images were obtained through the Lumbar spine after
the intrathecal infusion of infusion. Coronal and sagittal
reconstructions were obtained of the axial image sets.

[Series 1: ortho standard · 1 of 1 slices shown (1 of 2)]
[im 1/1]
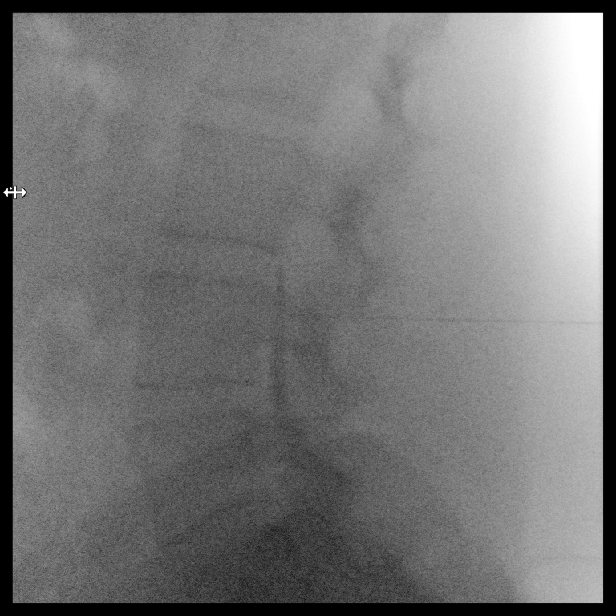

[Series 2: ortho standard · 1 of 1 slices shown (2 of 2)]
[im 1/1]
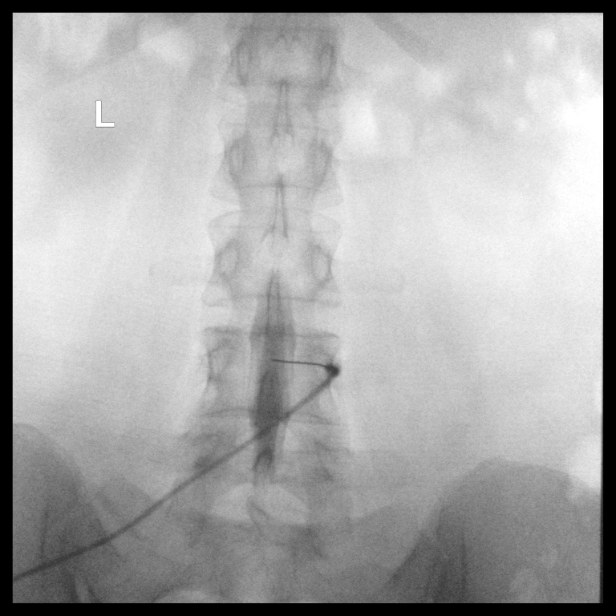

[Series 2: w lumbar spine lat · 0.15mm/px · 1 of 1 slices shown]
[im 1/1]
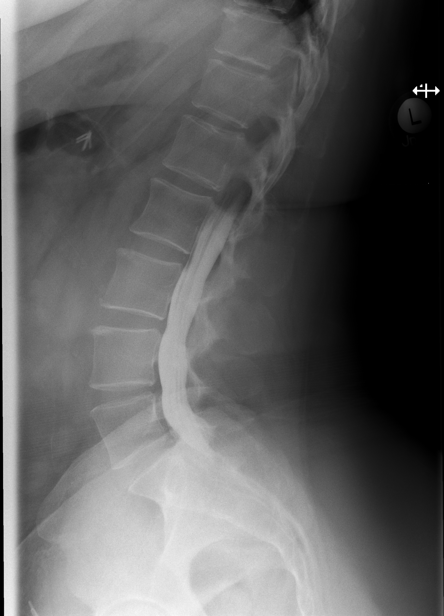

[Series 3: w lumbar spine flexion · 0.15mm/px · 1 of 1 slices shown]
[im 1/1]
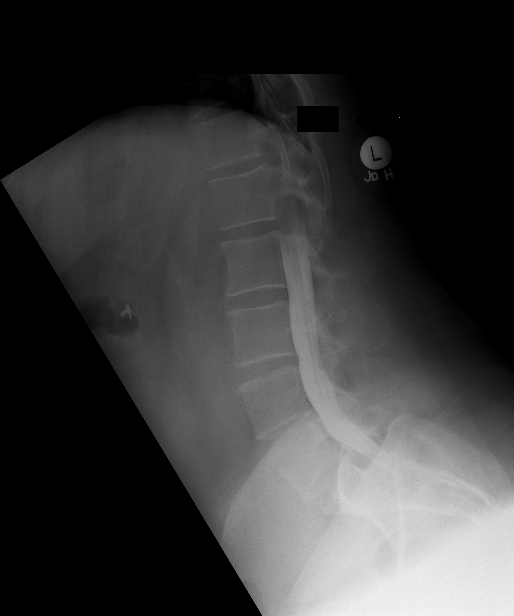

[Series 4: vasc adipose · 1 of 1 slices shown (1 of 9)]
[im 1/1]
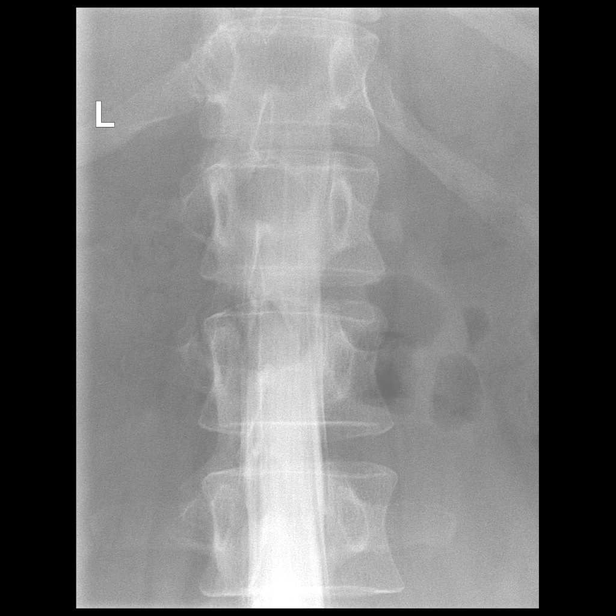

[Series 4: w lumbar spine extension · 0.15mm/px · 1 of 1 slices shown]
[im 1/1]
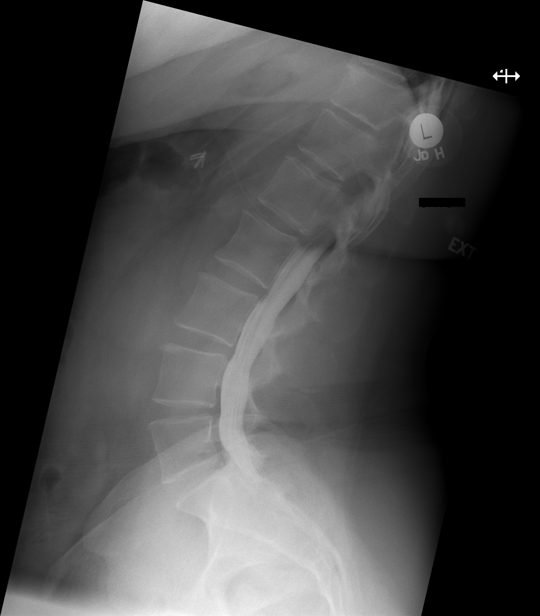

[Series 6: vasc adipose · 1 of 1 slices shown (2 of 9)]
[im 1/1]
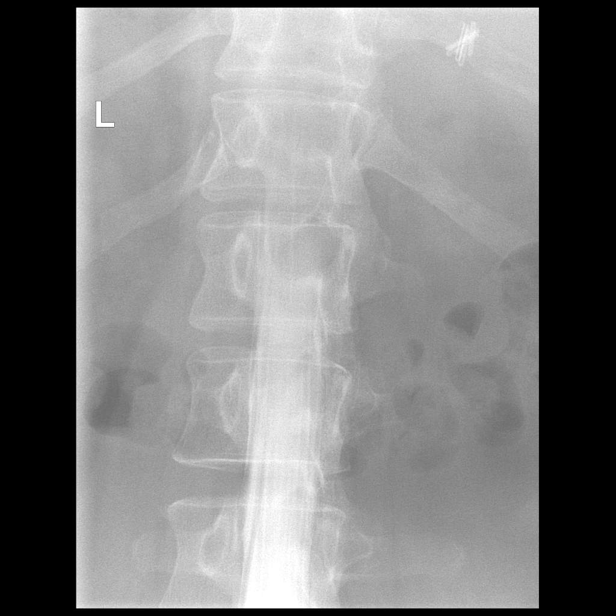

[Series 7: vasc adipose · 1 of 1 slices shown (3 of 9)]
[im 1/1]
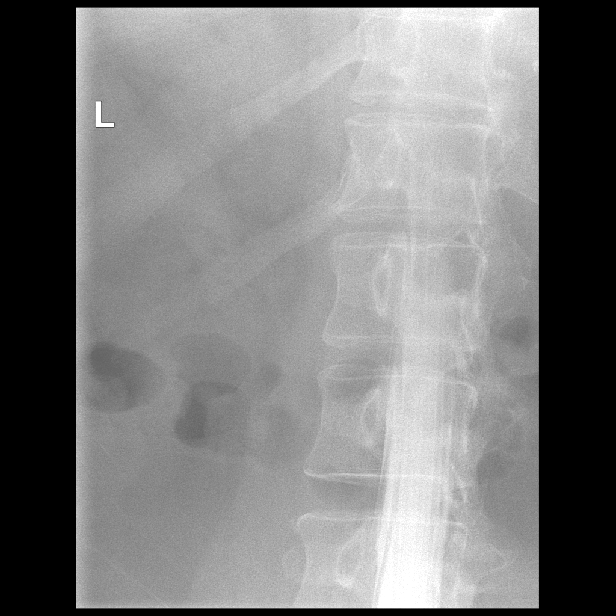

[Series 9: vasc adipose · 1 of 1 slices shown (4 of 9)]
[im 1/1]
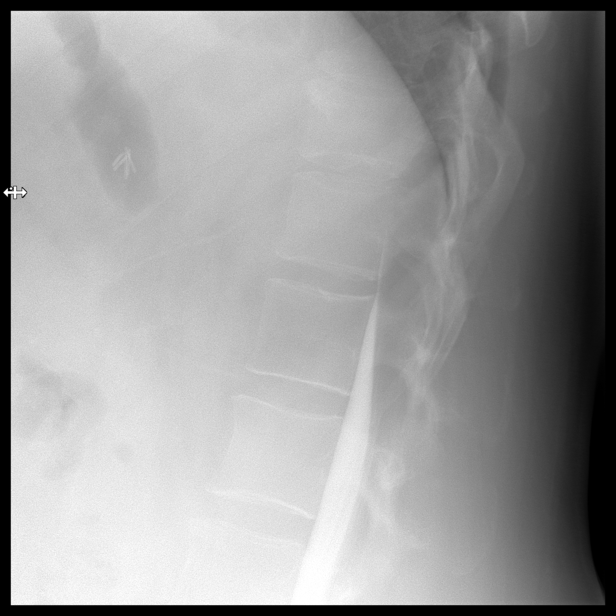

[Series 10: vasc adipose · 1 of 1 slices shown (5 of 9)]
[im 1/1]
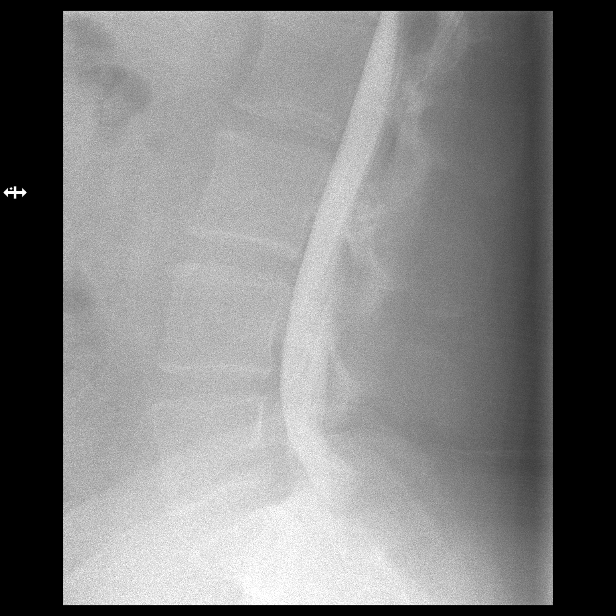

[Series 12: vasc adipose · 1 of 1 slices shown (6 of 9)]
[im 1/1]
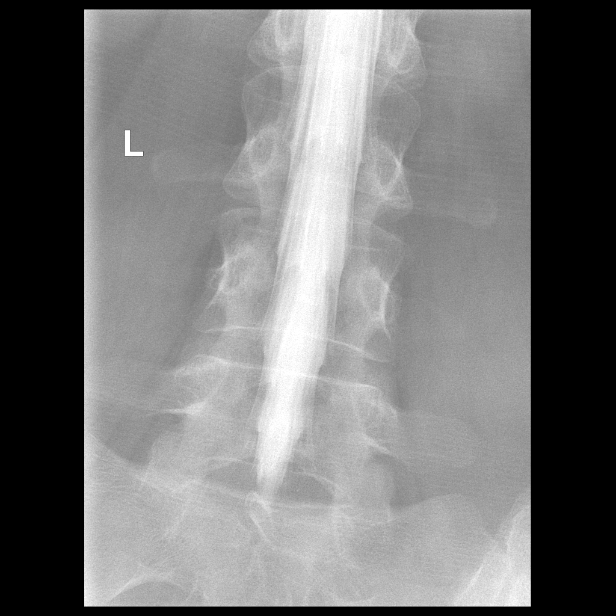

[Series 13: vasc adipose · 1 of 1 slices shown (7 of 9)]
[im 1/1]
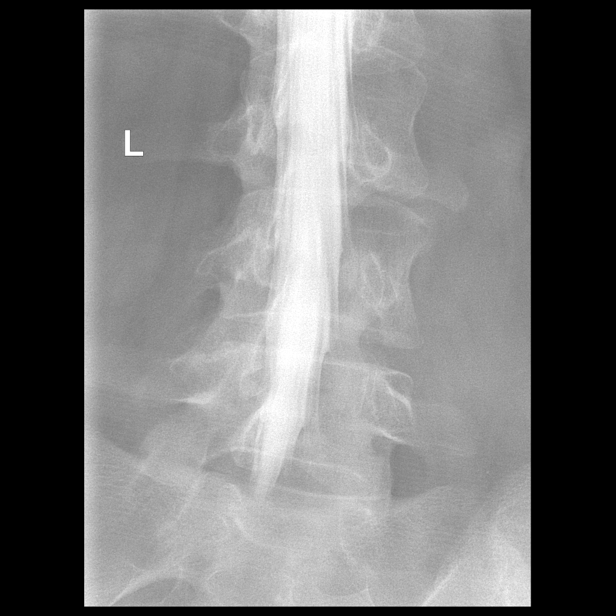

[Series 14: vasc adipose · 1 of 1 slices shown (8 of 9)]
[im 1/1]
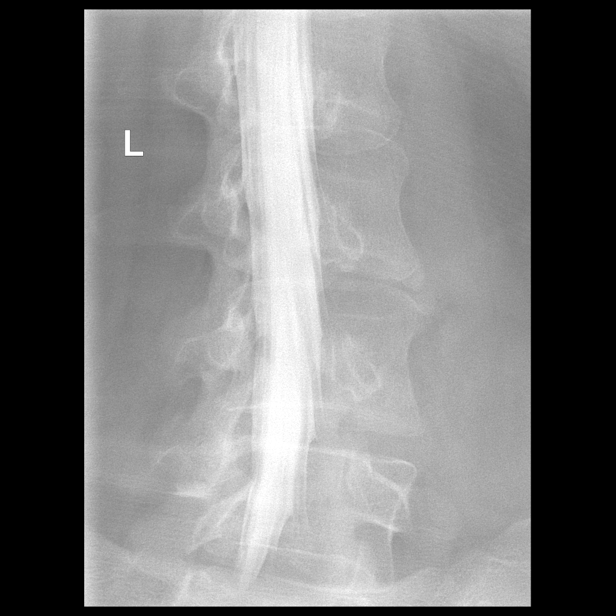

[Series 16: vasc adipose · 1 of 1 slices shown (9 of 9)]
[im 1/1]
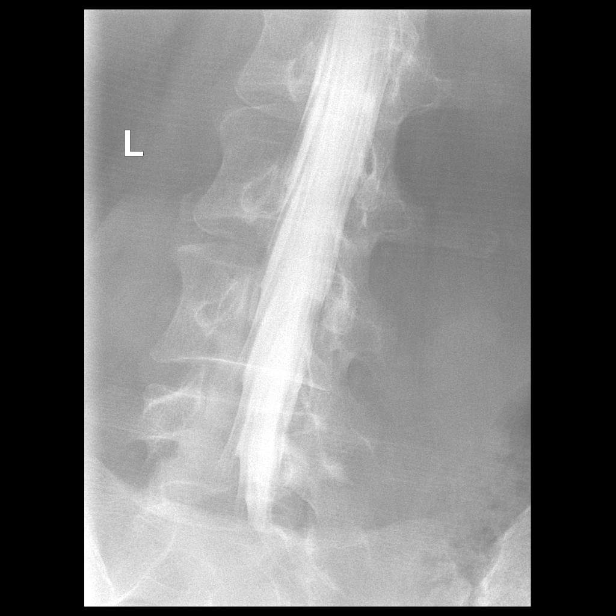

[14 of 20 positions shown; findings below may reference images not displayed]

EXAM:
LUMBAR MYELOGRAM

FLUOROSCOPY TIME:  16 seconds corresponding to a Dose Area Product
of 260 ?Gy*m2

PROCEDURE:
After thorough discussion of risks and benefits of the procedure
including bleeding, infection, injury to nerves, blood vessels,
adjacent structures as well as headache and CSF leak, written and
oral informed consent was obtained. Consent was obtained by Dr. Villak
Montae. Time out form was completed.

Patient was positioned prone on the fluoroscopy table. Local
anesthesia was provided with 1% lidocaine without epinephrine after
prepped and draped in the usual sterile fashion. Puncture was
performed at L4 using a 3 1/2 inch 22-gauge spinal needle via
midline approach. Using a single pass through the dura, the needle
was placed within the thecal sac, with return of clear CSF. 15 mL of
Isovue-M 200 was injected into the thecal sac, with normal
opacification of the nerve roots and cauda equina consistent with
free flow within the subarachnoid space.

I personally performed the lumbar puncture and administered the
intrathecal contrast. I also personally supervised acquisition of
the myelogram images.
FINDINGS: LUMBAR MYELOGRAM FINDINGS:

Good opacification lumbar subarachnoid space. There is no nerve root
cut off or spinal stenosis. Normal-appearing conus. Intervertebral
disc spaces are maintained.

Anatomic alignment. No dynamic instability on standing flexion
extension radiographs.

CT LUMBAR MYELOGRAM FINDINGS:

Segmentation: Normal.

Alignment:  Normal.

Vertebrae: No worrisome osseous lesion.

Conus medullaris: Normal in size and location.

Paraspinal tissues: No evidence for hydronephrosis or paravertebral
mass.

Disc levels:

L1-L2:  Normal.

L2-L3:  Normal.

L3-L4:  Normal.

L4-L5:  Normal.

L5-S1: Calcified central protrusion. Facet arthropathy. No
impingement.

Careful examination of far RIGHT and LEFT parasagittal images,
including attention to the extraforaminal compartment, reveals no
compressive lesion on the LEFT
IMPRESSION: LUMBAR MYELOGRAM IMPRESSION:

Unremarkable lumbar myelogram. No spinal stenosis or nerve root
encroachment.

Anatomic alignment without dynamic instability.

CT LUMBAR MYELOGRAM IMPRESSION:

Calcified central protrusion at L5-S1.  No neural impingement.

No LEFT-sided disc herniation is observed.

## 2018-08-10 LAB — CUP PACEART REMOTE DEVICE CHECK
Battery Voltage: 2.9 V
Brady Statistic AP VP Percent: 1 %
Brady Statistic AP VS Percent: 1 %
Brady Statistic AS VP Percent: 1 %
Brady Statistic AS VS Percent: 99 %
Brady Statistic RV Percent Paced: 1 %
Implantable Lead Implant Date: 20111130
Implantable Lead Location: 753860
Implantable Pulse Generator Implant Date: 20111130
Lead Channel Impedance Value: 400 Ohm
Lead Channel Pacing Threshold Amplitude: 2.25 V
Lead Channel Pacing Threshold Pulse Width: 0.4 ms
Lead Channel Sensing Intrinsic Amplitude: 4.6 mV
Lead Channel Setting Pacing Amplitude: 1.875
Lead Channel Setting Pacing Amplitude: 2.5 V
Lead Channel Setting Pacing Pulse Width: 0.4 ms
Lead Channel Setting Sensing Sensitivity: 2 mV
MDC IDC LEAD IMPLANT DT: 20111130
MDC IDC LEAD LOCATION: 753859
MDC IDC MSMT BATTERY REMAINING LONGEVITY: 86 mo
MDC IDC MSMT BATTERY REMAINING PERCENTAGE: 65 %
MDC IDC MSMT LEADCHNL RA IMPEDANCE VALUE: 400 Ohm
MDC IDC MSMT LEADCHNL RA PACING THRESHOLD AMPLITUDE: 0.875 V
MDC IDC MSMT LEADCHNL RA PACING THRESHOLD PULSEWIDTH: 0.4 ms
MDC IDC MSMT LEADCHNL RA SENSING INTR AMPL: 2.9 mV
MDC IDC SESS DTM: 20190729074659
MDC IDC STAT BRADY RA PERCENT PACED: 1 %
Pulse Gen Model: 2210
Pulse Gen Serial Number: 7192850

## 2018-09-17 ENCOUNTER — Telehealth: Payer: Self-pay

## 2018-09-17 NOTE — Telephone Encounter (Signed)
Patient called and states she has been having some chest and sinus congestion and chest tightness with some shortness of breath for the last 2-3 weeks.She states she can hear rattling in chest when she lies down. She also states her right arm is aching for the last 2-3 weeks. Per pt she would like an appt for this coming Friday 09/21/2018 no sooner than that. I spoke with Dr.Steve Luking and he states to give the appt for Friday. I advised the pt if gets worse between now and then go to the Ed.She states understanding.

## 2018-09-21 ENCOUNTER — Ambulatory Visit (INDEPENDENT_AMBULATORY_CARE_PROVIDER_SITE_OTHER): Payer: Managed Care, Other (non HMO) | Admitting: Family Medicine

## 2018-09-21 ENCOUNTER — Encounter: Payer: Self-pay | Admitting: Family Medicine

## 2018-09-21 ENCOUNTER — Telehealth: Payer: Self-pay | Admitting: *Deleted

## 2018-09-21 ENCOUNTER — Ambulatory Visit (HOSPITAL_COMMUNITY)
Admission: RE | Admit: 2018-09-21 | Discharge: 2018-09-21 | Disposition: A | Discharge status: Home / Self Care | Payer: Managed Care, Other (non HMO) | Source: Ambulatory Visit | Attending: Family Medicine | Admitting: Family Medicine

## 2018-09-21 VITALS — BP 128/80 | Temp 98.1°F | Ht 66.0 in | Wt 225.0 lb

## 2018-09-21 DIAGNOSIS — Z87891 Personal history of nicotine dependence: Secondary | ICD-10-CM

## 2018-09-21 DIAGNOSIS — R059 Cough, unspecified: Secondary | ICD-10-CM

## 2018-09-21 DIAGNOSIS — R0602 Shortness of breath: Secondary | ICD-10-CM | POA: Insufficient documentation

## 2018-09-21 DIAGNOSIS — R05 Cough: Secondary | ICD-10-CM

## 2018-09-21 DIAGNOSIS — J019 Acute sinusitis, unspecified: Secondary | ICD-10-CM | POA: Diagnosis not present

## 2018-09-21 DIAGNOSIS — R911 Solitary pulmonary nodule: Secondary | ICD-10-CM

## 2018-09-21 MED ORDER — ALBUTEROL SULFATE HFA 108 (90 BASE) MCG/ACT IN AERS: 2.0000 | INHALATION_SPRAY | RESPIRATORY_TRACT | 0 refills | Status: DC | PRN

## 2018-09-21 MED ORDER — AMOXICILLIN-POT CLAVULANATE 875-125 MG PO TABS: 1.0000 | ORAL_TABLET | Freq: Two times a day (BID) | ORAL | 0 refills | Status: DC

## 2018-09-21 NOTE — Progress Notes (Signed)
   Subjective:    Patient ID: [name], female    DOB: 1961/09/07, 57 y.o.   MRN: [id number]  HPISOB when walking and lying down. Started in the last month or so. Cough and congestion for about the same amount of time. Tightness in chest at times.  Pt states she has a crackling sound when she is lying down.   Pt concerned about cancer. Brother and father both passed with cancer.   Right shoulder and arm pain for past couple of weeks. Tried icy hot.   Would like to start back on phentermine. States she was told to stop when she started the antidepressant med. She is taking zoloft.   Would like to get flu vaccine.     Review of Systems  Constitutional: Negative for activity change, appetite change and fatigue.  HENT: Positive for congestion and sinus pain. Negative for rhinorrhea.   Respiratory: Positive for cough, shortness of breath and wheezing.   Cardiovascular: Negative for chest pain and leg swelling.  Gastrointestinal: Negative for abdominal pain and diarrhea.  Endocrine: Negative for polydipsia and polyphagia.  Skin: Negative for color change.  Neurological: Negative for dizziness and weakness.  Psychiatric/Behavioral: Negative for behavioral problems and confusion.       Objective:   Physical Exam  Constitutional: She appears well-nourished. No distress.  HENT:  Head: Normocephalic and atraumatic.  Eyes: Right eye exhibits no discharge. Left eye exhibits no discharge.  Neck: No tracheal deviation present.  Cardiovascular: Normal rate, regular rhythm and normal heart sounds.  No murmur heard. Pulmonary/Chest: Effort normal and breath sounds normal. No respiratory distress.  Musculoskeletal: She exhibits no edema.  Lymphadenopathy:    She has no cervical adenopathy.  Neurological: She is alert. Coordination normal.  Skin: Skin is warm and dry.  Psychiatric: She has a normal mood and affect. Her behavior is normal.  Vitals reviewed.   Patient has a deep cough  but there is no sign of pneumonia currently Please see CT scan from 2012     Assessment & Plan:  I kindly deferred the patient on a couple issues we were seeing her today for urgent care and she also wanted refills of phenteramine and her right shoulder pain addressed but it was not possible to do these today  Patient is a smoker we talked about the importance of quitting smoking we will try Wellbutrin 150 mg SR twice daily if that does not work she will consider Chantix  Patient had a very small pulmonary nodule back in 2012 we will do a CT scan of the lung but chest x-ray ray first  Patient with upper respiratory illness acute rhinosinusitis along with bronchitis along with a deep cough for a month as a smoker will do a chest x-ray as as well as Augmentin twice daily 10 days and albuterol as needed

## 2018-09-21 NOTE — Telephone Encounter (Signed)
Pt notified chest xray looked good per dr Nicki Reaper. Pt notified our office will be working on setting up ct scan. Pt prefers a Monday or Friday at [facility] Dr Nicki Reaper will order test first and send to nurses to order in.

## 2018-09-21 NOTE — Telephone Encounter (Signed)
Stat results in epic

## 2018-09-23 NOTE — Telephone Encounter (Signed)
CAT scan recommended on the result note of the chest x-ray please see that thank you

## 2018-09-23 NOTE — Telephone Encounter (Signed)
CT scan ordered please see result note on the chest x-ray thank you

## 2018-09-24 NOTE — Telephone Encounter (Signed)
Ct scan order put in to be precerted then scheduled.

## 2018-09-26 ENCOUNTER — Encounter: Payer: Self-pay | Admitting: Family Medicine

## 2018-09-26 ENCOUNTER — Ambulatory Visit (INDEPENDENT_AMBULATORY_CARE_PROVIDER_SITE_OTHER): Payer: Managed Care, Other (non HMO) | Admitting: Family Medicine

## 2018-09-26 VITALS — BP 134/88 | Ht 66.0 in | Wt 225.6 lb

## 2018-09-26 DIAGNOSIS — M25511 Pain in right shoulder: Secondary | ICD-10-CM

## 2018-09-26 DIAGNOSIS — M79621 Pain in right upper arm: Secondary | ICD-10-CM

## 2018-09-26 DIAGNOSIS — Z23 Encounter for immunization: Secondary | ICD-10-CM | POA: Diagnosis not present

## 2018-09-26 DIAGNOSIS — S46911A Strain of unspecified muscle, fascia and tendon at shoulder and upper arm level, right arm, initial encounter: Secondary | ICD-10-CM

## 2018-09-26 MED ORDER — DICLOFENAC SODIUM 75 MG PO TBEC: DELAYED_RELEASE_TABLET | ORAL | 0 refills | Status: DC

## 2018-09-26 MED ORDER — PHENTERMINE HCL 37.5 MG PO TABS: 37.5000 mg | ORAL_TABLET | Freq: Every day | ORAL | 2 refills | Status: DC

## 2018-09-26 NOTE — Progress Notes (Signed)
   Subjective:    Patient ID: [name], female    DOB: 11/07/1961, 57 y.o.   MRN: [id number]  Arm Pain   The incident occurred more than 1 week ago. The pain is present in the right shoulder, upper right arm and right forearm. The pain radiates to the right neck. She has tried heat and ice (different sleep positions; different pillows) for the symptoms.    Started having soreness and pain in the righ arm  Worse in the muscle areas  Recalls no major injury  Lifting g kids a lot  Has tried heating pad and oicy hot   Not feeling the bet in this regard  No major injuries  Pos hx of tightness      Feels pulling sensaion   Review of Systems No headache, no major weight loss or weight gain, no chest pain no back pain abdominal pain no change in bowel habits complete ROS otherwise negative     Objective:   Physical Exam Alert vitals stable, NAD. Blood pressure good on repeat. HEENT normal. Lungs clear. Heart regular rate and rhythm. Right shoulder negative impingement sign.  Shoulder joint normal positive diffuse tenderness parascapular paracervical upper arm.       Assessment & Plan:  Impression 1 muscle strain #2 morbid obesity discussed patient management prescribed side effects discussed

## 2018-10-01 ENCOUNTER — Emergency Department (HOSPITAL_COMMUNITY)
Admission: EM | Admit: 2018-10-01 | Discharge: 2018-10-02 | Disposition: A | Discharge status: Home / Self Care | Payer: Managed Care, Other (non HMO) | Attending: Emergency Medicine | Admitting: Emergency Medicine

## 2018-10-01 ENCOUNTER — Other Ambulatory Visit: Payer: Self-pay

## 2018-10-01 DIAGNOSIS — K219 Gastro-esophageal reflux disease without esophagitis: Secondary | ICD-10-CM | POA: Insufficient documentation

## 2018-10-01 DIAGNOSIS — F17210 Nicotine dependence, cigarettes, uncomplicated: Secondary | ICD-10-CM | POA: Insufficient documentation

## 2018-10-01 DIAGNOSIS — R0789 Other chest pain: Secondary | ICD-10-CM

## 2018-10-01 DIAGNOSIS — E039 Hypothyroidism, unspecified: Secondary | ICD-10-CM | POA: Insufficient documentation

## 2018-10-01 DIAGNOSIS — Z79899 Other long term (current) drug therapy: Secondary | ICD-10-CM | POA: Diagnosis not present

## 2018-10-01 DIAGNOSIS — R079 Chest pain, unspecified: Secondary | ICD-10-CM | POA: Diagnosis present

## 2018-10-01 LAB — CBC
HCT: 45.9 % (ref 36.0–46.0)
Hemoglobin: 14.6 g/dL (ref 12.0–15.0)
MCH: 29.1 pg (ref 26.0–34.0)
MCHC: 31.8 g/dL (ref 30.0–36.0)
MCV: 91.6 fL (ref 80.0–100.0)
PLATELETS: 368 10*3/uL (ref 150–400)
RBC: 5.01 MIL/uL (ref 3.87–5.11)
RDW: 12.9 % (ref 11.5–15.5)
WBC: 7 10*3/uL (ref 4.0–10.5)
nRBC: 0 % (ref 0.0–0.2)

## 2018-10-01 LAB — BASIC METABOLIC PANEL
Anion gap: 8 (ref 5–15)
BUN: 5 mg/dL — AB (ref 6–20)
CALCIUM: 9.3 mg/dL (ref 8.9–10.3)
CO2: 27 mmol/L (ref 22–32)
CREATININE: 0.96 mg/dL (ref 0.44–1.00)
Chloride: 105 mmol/L (ref 98–111)
GFR calc Af Amer: >60 mL/min (ref >60)
Glucose, Bld: 138 mg/dL — ABNORMAL HIGH (ref 70–99)
Potassium: 3.4 mmol/L — ABNORMAL LOW (ref 3.5–5.1)
SODIUM: 140 mmol/L (ref 135–145)

## 2018-10-01 LAB — I-STAT TROPONIN, ED: TROPONIN I, POC: 0 ng/mL (ref 0.00–0.08)

## 2018-10-01 NOTE — ED Triage Notes (Signed)
Pt here with chest pain and tightness over the last day.  Pain in the center does not radiate. Last few weeks has on and off SOB, and lethargic.

## 2018-10-01 NOTE — ED Triage Notes (Addendum)
Had CT scan done at [place] for persistent cough and congestion.

## 2018-10-01 NOTE — ED Provider Notes (Signed)
Patient placed in Quick Look pathway, seen and evaluated   Chief Complaint: chest pain  HPI: chest pain  ROS: [name] is a 57 y.o. female who presents to the ED with chest pain that started today around 10 am and then again at 3:30 pm. Patient reports she has had a cough x 5 weeks and had a CXR last week and then a CT of her chest today. Patient reports other episodes of chest pain that have occurred since the cough started 5 weeks ago but it usually just get better but not today. Patient taking Augmentin.  Physical Exam:  BP (!) 145/80 (BP Location: Right Arm)   Pulse 77   Temp 98.1 F (36.7 C) (Oral)   Resp 16   SpO2 97%    Gen: No distress  Neuro: Awake and Alert  Skin: Warm and dry  Heart: regular rate and rhythm  Lungs: occasional rales   Initiation of care has begun. The patient has been counseled on the process, plan, and necessity for staying for the completion/evaluation, and the remainder of the medical screening examination    Ashley Murrain, NP 10/01/18 [place], MD 10/02/18 [id number]

## 2018-10-02 ENCOUNTER — Emergency Department (HOSPITAL_COMMUNITY): Payer: Managed Care, Other (non HMO)

## 2018-10-02 LAB — I-STAT TROPONIN, ED: TROPONIN I, POC: 0 ng/mL (ref 0.00–0.08)

## 2018-10-02 MED ORDER — ALUM & MAG HYDROXIDE-SIMETH 200-200-20 MG/5ML PO SUSP
30.0000 mL | Freq: Once | ORAL | Status: AC
  Administered 2018-10-02: 30 mL via ORAL
  Filled 2018-10-02: qty 30

## 2018-10-02 MED ORDER — PANTOPRAZOLE SODIUM 40 MG PO TBEC
40.0000 mg | DELAYED_RELEASE_TABLET | Freq: Once | ORAL | Status: AC
  Administered 2018-10-02: 40 mg via ORAL
  Filled 2018-10-02: qty 1

## 2018-10-02 MED ORDER — LIDOCAINE VISCOUS HCL 2 % MT SOLN
15.0000 mL | Freq: Once | OROMUCOSAL | Status: AC
  Administered 2018-10-02: 15 mL via ORAL
  Filled 2018-10-02: qty 15

## 2018-10-02 MED ORDER — PANTOPRAZOLE SODIUM 40 MG PO TBEC: 40.0000 mg | DELAYED_RELEASE_TABLET | Freq: Once | ORAL | Status: DC

## 2018-10-02 MED ORDER — PANTOPRAZOLE SODIUM 40 MG PO TBEC: 40.0000 mg | DELAYED_RELEASE_TABLET | Freq: Every day | ORAL | 1 refills | Status: DC

## 2018-10-02 NOTE — Discharge Instructions (Signed)
Please avoid NSAIDs such as aspirin (Goody powders), ibuprofen (Motrin, Advil), naproxen (Aleve) as these may worsen your symptoms.  Tylenol 1000 mg every 6 hours is safe to take as long as you have no history of liver problems (heavy alcohol use, cirrhosis, hepatitis).  Please avoid spicy, acidic (citrus fruits, tomato based sauces, salsa), greasy, fatty foods.  Please avoid caffeine and alcohol.  You may use over-the-counter Mylanta, Tums, Zantac as needed for your symptoms.

## 2018-10-02 NOTE — ED Provider Notes (Signed)
TIME SEEN: 2:31 AM  CHIEF COMPLAINT: Chest pain  HPI: Patient is a 57 year old female with history of GERD, esophageal stricture, hypothyroidism, hyperlipidemia, tobacco use who presents to the emergency department with 2 weeks of intermittent burning chest pain in the center of her chest and feeling like a weight sitting on her chest.  She does intermittently feel short of breath with exertion over the past 2 weeks.  Was seen by her primary care physician several weeks ago for right arm pain which they thought was musculoskeletal and put her on anti-inflammatories.  She underwent a CT scan of her chest without contrast yesterday given history of previous pulmonary nodules.  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  Last stress test was years ago.  No history of MI, PE or DVT.  She denies that her chest pain is pleuritic, exertional or related to eating food.  ROS: See HPI Constitutional: no fever  Eyes: no drainage  ENT: no runny nose   Cardiovascular:   chest pain  Resp:  SOB  GI: no vomiting GU: no dysuria Integumentary: no rash  Allergy: no hives  Musculoskeletal: no leg swelling  Neurological: no slurred speech ROS otherwise negative  PAST MEDICAL HISTORY/PAST SURGICAL HISTORY:  Past Medical History:  Diagnosis Date  . Anxiety   . Arthritis   . Depression    bipolar disorder  . Esophageal stricture   . GERD (gastroesophageal reflux disease)   . Heart defect   . HLD (hyperlipidemia)   . Hypothyroidism   . IBS (irritable bowel syndrome)   . IFG (impaired fasting glucose)   . Migraine headache   . OSA (obstructive sleep apnea)   . Reflux   . Sleep apnea   . Symptomatic bradycardia     MEDICATIONS:  Prior to Admission medications   Medication Sig Start Date End Date Taking? Authorizing Provider  albuterol (PROVENTIL HFA;VENTOLIN HFA) 108 (90 Base) MCG/ACT inhaler Inhale 2 puffs into the lungs every 4 (four) hours as needed for wheezing or shortness  of breath. 09/21/18   Kathyrn Drown, MD  ALPRAZolam Duanne Moron) 1 MG tablet TAKE ONE-HALF TABLET BY MOUTH TWICE DAILY AS NEEDED FOR ANXIETY OR SLEEP 06/15/18   Mikey Kirschner, MD  amoxicillin-clavulanate (AUGMENTIN) 875-125 MG tablet Take 1 tablet by mouth 2 (two) times daily. 09/21/18   Kathyrn Drown, MD  aspirin 81 MG tablet Take 162 mg by mouth daily.    [provider]  Cholecalciferol (VITAMIN D3) 1000 UNITS tablet Take 1,000 Units by mouth daily.      [provider]  diclofenac (VOLTAREN) 75 MG EC tablet Take one tablet by mouth twice daily with food as needed for arm pain 09/26/18   Mikey Kirschner, MD  fluticasone [facility]) 50 MCG/ACT nasal spray Place 1 spray into both nostrils daily. 06/15/18   Mikey Kirschner, MD  hydrocortisone 2.5 % cream Apply topically 2 (two) times daily. 01/16/18   Mikey Kirschner, MD  levothyroxine (SYNTHROID, LEVOTHROID) 75 MCG tablet TAKE 1 TABLET BY MOUTH ONCE DAILY (NEEDS OFFICE VISIT) 07/16/18   Mikey Kirschner, MD  levothyroxine (SYNTHROID, LEVOTHROID) 75 MCG tablet TAKE 1 TABLET BY MOUTH ONCE DAILY 07/16/18   Mikey Kirschner, MD  omeprazole (PRILOSEC) 20 MG capsule TAKE ONE CAPSULE BY MOUTH TWICE DAILY 05/23/16   Mikey Kirschner, MD  phentermine (ADIPEX-P) 37.5 MG tablet Take 1 tablet (37.5 mg total) by mouth daily before breakfast. 09/26/18   Mikey Kirschner,  MD  rosuvastatin (CRESTOR) 10 MG tablet TAKE 1 TABLET BY MOUTH ONCE DAILY FOR CHOLESTEROL 06/15/18   Mikey Kirschner, MD  sertraline (ZOLOFT) 100 MG tablet Take 1 tablet (100 mg total) by mouth daily. 11/16/17   Nilda Simmer, NP  Vitamin D, Ergocalciferol, (DRISDOL) 50000 units CAPS capsule Take 1 capsule (50,000 Units total) by mouth every 7 (seven) days. 06/25/18   Nilda Simmer, NP    ALLERGIES:  Allergies  Allergen Reactions  . Codeine   . Sulfonamide Derivatives   . Betadine [Povidone Iodine] Other (See Comments)    Blisters   . Paxil [Paroxetine Hcl] Other  (See Comments)    Extreme sweating  . Vioxx [Rofecoxib] Rash    SOCIAL HISTORY:  Social History   Tobacco Use  . Smoking status: Current Every Day Smoker    Packs/day: 1.00    Years: 25.00    Pack years: 25.00    Types: Cigarettes  . Smokeless tobacco: Never Used  . Tobacco comment: tobacco info givin 03/08/16  Substance Use Topics  . Alcohol use: No    Alcohol/week: 0.0 standard drinks    Comment: Occ.     FAMILY HISTORY: Family History  Problem Relation Age of Onset  . Throat cancer Father        squamous cell carcinoma  . Tongue cancer Father   . Esophageal cancer Father   . Arthritis Mother   . Heart disease Mother        coronary; female < 64  . Asthma Mother   . Colon cancer Neg Hx     EXAM: BP 131/73   Pulse 73   Temp 98 F (36.7 C) (Oral)   Resp 20   SpO2 96%  CONSTITUTIONAL: Alert and oriented and responds appropriately to questions. Well-appearing; well-nourished HEAD: Normocephalic EYES: Conjunctivae clear, pupils appear equal, EOMI ENT: normal nose; moist mucous membranes NECK: Supple, no meningismus, no nuchal rigidity, no LAD  CARD: RRR; S1 and S2 appreciated; no murmurs, no clicks, no rubs, no gallops RESP: Normal chest excursion without splinting or tachypnea; breath sounds clear and equal bilaterally; no wheezes, no rhonchi, no rales, no hypoxia or respiratory distress, speaking full sentences ABD/GI: Normal bowel sounds; non-distended; soft, non-tender, no rebound, no guarding, no peritoneal signs, no hepatosplenomegaly BACK:  The back appears normal and is non-tender to palpation, there is no CVA tenderness EXT: Normal ROM in all joints; non-tender to palpation; no edema; normal capillary refill; no cyanosis, no calf tenderness or swelling    SKIN: Normal color for age and race; warm; no rash NEURO: Moves all extremities equally PSYCH: The patient's mood and manner are appropriate. Grooming and personal hygiene are appropriate.  MEDICAL  DECISION MAKING: Patient here with complains of chest pain.  It has been ongoing for several weeks but constant since earlier t yesterday afternoon.  Not exertional, pleuritic or related to eating food.  Seems very atypical.  Had a CT of her chest yesterday.  Unable to access these images or report at this time.  Will obtain chest x-ray.  First troponin today is negative.  EKG shows no ischemic changes.  Plan is to repeat second troponin, give Maalox with viscous lidocaine to see if this improves her symptoms.  Her heart score is 3.  I have low suspicion for ACS, PE or dissection at this time.  ED PROGRESS: Second troponin negative.  Chest x-ray clear.  She reports feeling better after Maalox and lidocaine.  Will give Protonix.  Suspect that this is GI in nature.  Awaiting pacemaker interrogation but anticipate discharge home.  Pacemaker interrogation normal.  Will discharge patient home with close outpatient follow-up.  She is comfortable with this plan.  Will discharge with prescription of Protonix.    At this time, I do not feel there is any life-threatening condition present. I have reviewed and discussed all results (EKG, imaging, lab, urine as appropriate) and exam findings with patient/family. I have reviewed nursing notes and appropriate previous records.  I feel the patient is safe to be discharged home without further emergent workup and can continue workup as an outpatient as needed. Discussed usual and customary return precautions. Patient/family verbalize understanding and are comfortable with this plan.  Outpatient follow-up has been provided if needed. All questions have been answered.    EKG Interpretation  Date/Time:  Monday October 01 2018 16:46:28 EDT Ventricular Rate:  78 PR Interval:  130 QRS Duration: 84 QT Interval:  398 QTC Calculation: 453 R Axis:   57 Text Interpretation:  Normal sinus rhythm Nonspecific T wave abnormality Abnormal ECG No significant change since last  tracing Confirmed by Ward, Cyril Mourning [phone]) on 10/02/2018 1:54:34 AM         Ward, Delice Bison, DO 10/02/18 [id number]

## 2018-10-08 ENCOUNTER — Ambulatory Visit (INDEPENDENT_AMBULATORY_CARE_PROVIDER_SITE_OTHER): Payer: Managed Care, Other (non HMO) | Admitting: *Deleted

## 2018-10-08 DIAGNOSIS — I442 Atrioventricular block, complete: Secondary | ICD-10-CM

## 2018-10-08 DIAGNOSIS — R55 Syncope and collapse: Secondary | ICD-10-CM

## 2018-10-08 NOTE — Progress Notes (Signed)
Remote pacemaker transmission.   

## 2018-10-09 ENCOUNTER — Telehealth: Payer: Self-pay | Admitting: *Deleted

## 2018-10-09 NOTE — Telephone Encounter (Signed)
Ct chest results faxed in from [facility]  Dr Richardson Landry wrote results on paper copy. Notify pt overall good, no serious abnormality seen except a couple of tiny pulmonary nodules. Radiologist recommends to be rescanned in one year. Place in [place] file per dr Richardson Landry.  Scan sent to medical records to be scanned into epic. Pt notified of results and verbalized undrestanding of all. Pt placed in the tickler file to repeat scan in one year.

## 2018-10-10 DIAGNOSIS — Z029 Encounter for administrative examinations, unspecified: Secondary | ICD-10-CM

## 2018-10-22 ENCOUNTER — Telehealth: Payer: Self-pay | Admitting: Family Medicine

## 2018-10-22 NOTE — Telephone Encounter (Signed)
Please advise 

## 2018-10-22 NOTE — Telephone Encounter (Signed)
incr zoloft to 150 daily six mo worth

## 2018-10-22 NOTE — Telephone Encounter (Signed)
Patient is requesting an increase in her depression medication due to some of her family members passing away last year that she was close to

## 2018-10-23 MED ORDER — SERTRALINE HCL 100 MG PO TABS: 150.0000 mg | ORAL_TABLET | Freq: Every day | ORAL | 1 refills | Status: DC

## 2018-10-23 NOTE — Telephone Encounter (Signed)
Prescription sent electronically to pharmacy.  Patient notified and verbalized understanding. 

## 2018-10-26 IMAGING — DX DG CHEST 2V
2 series · 2 of 2 positions shown · non-contrast
Comparison: 11/11/2010

CLINICAL DATA: Sob on exertion/smoker/pacemaker/family hx lung and
tongue cancer

EXAM:
CHEST  2 VIEW

[chest pa]
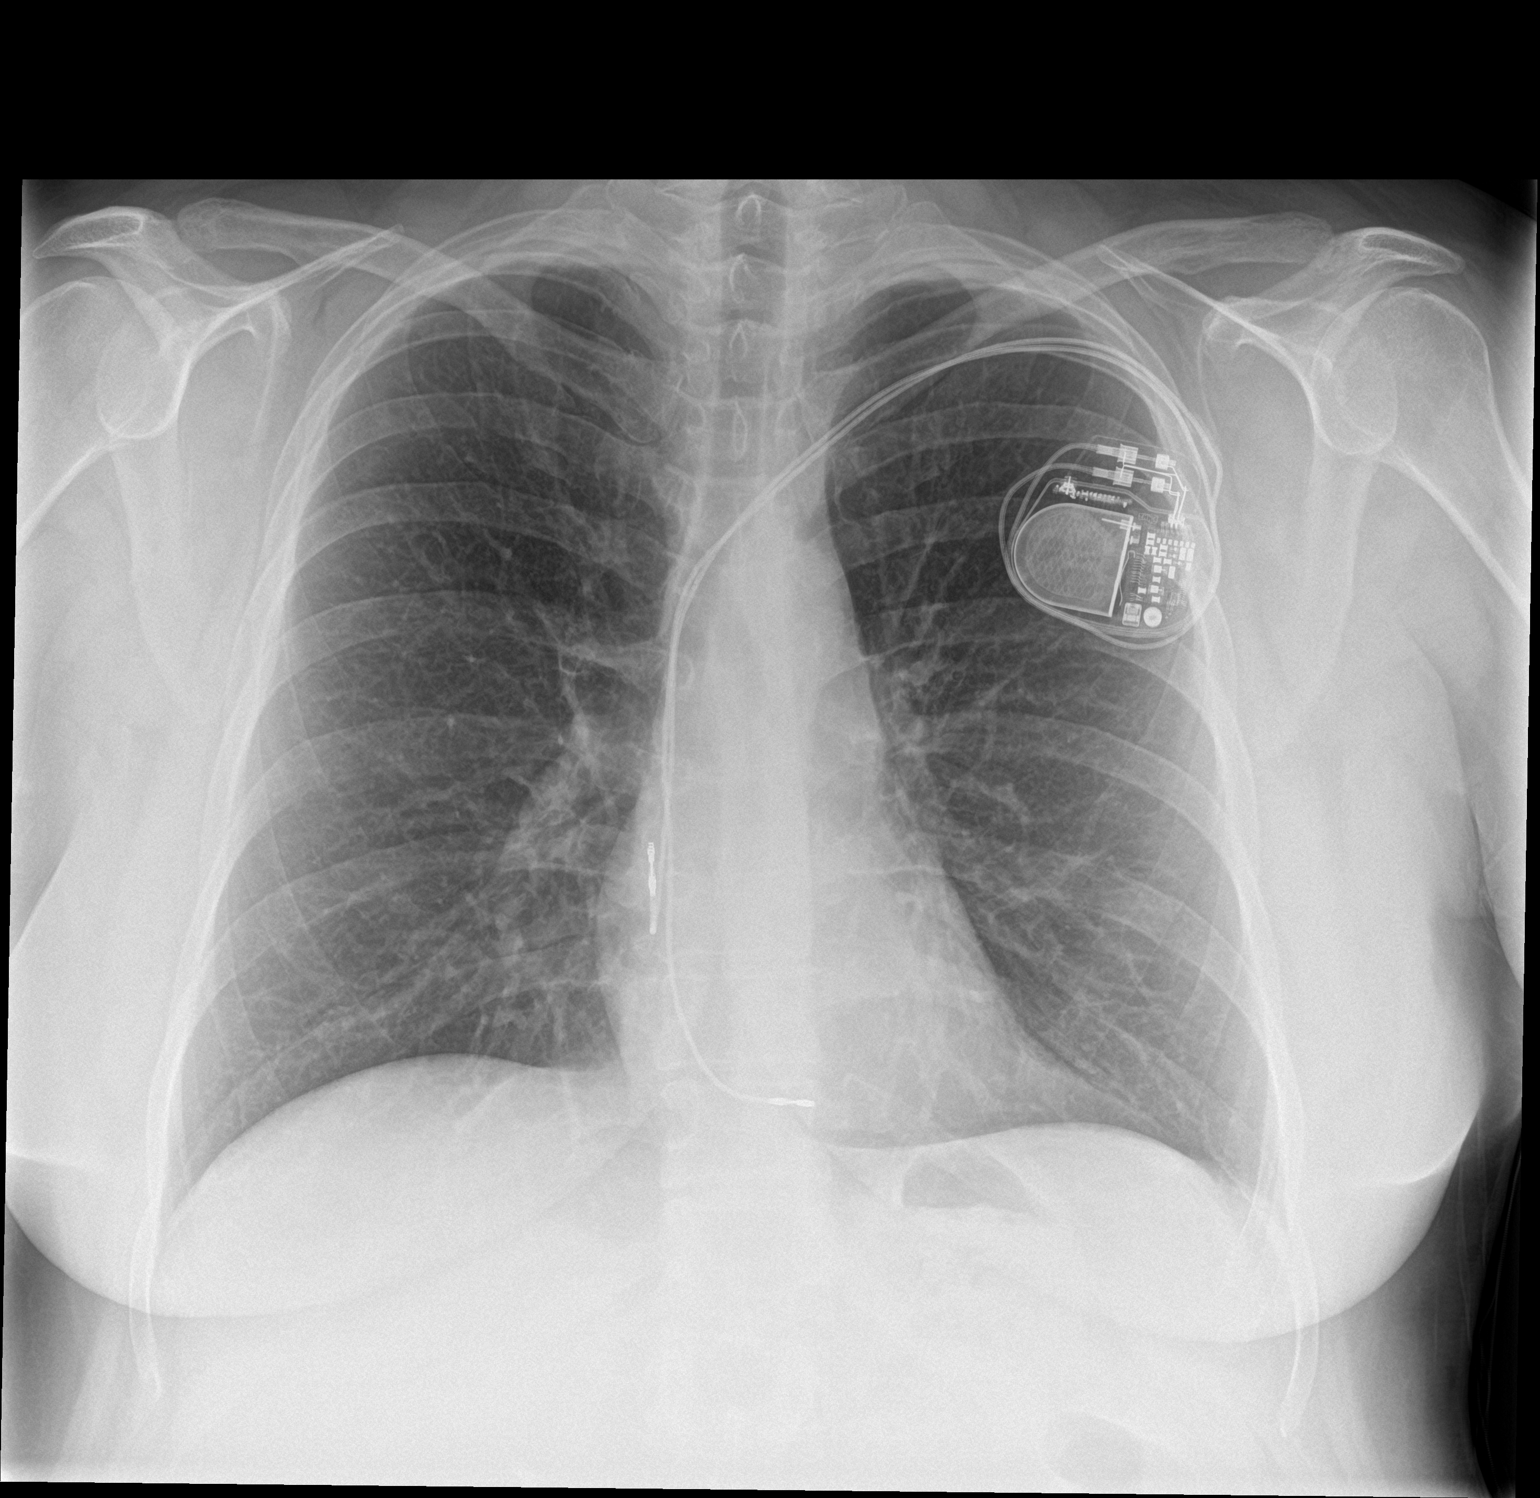

[chest lat]
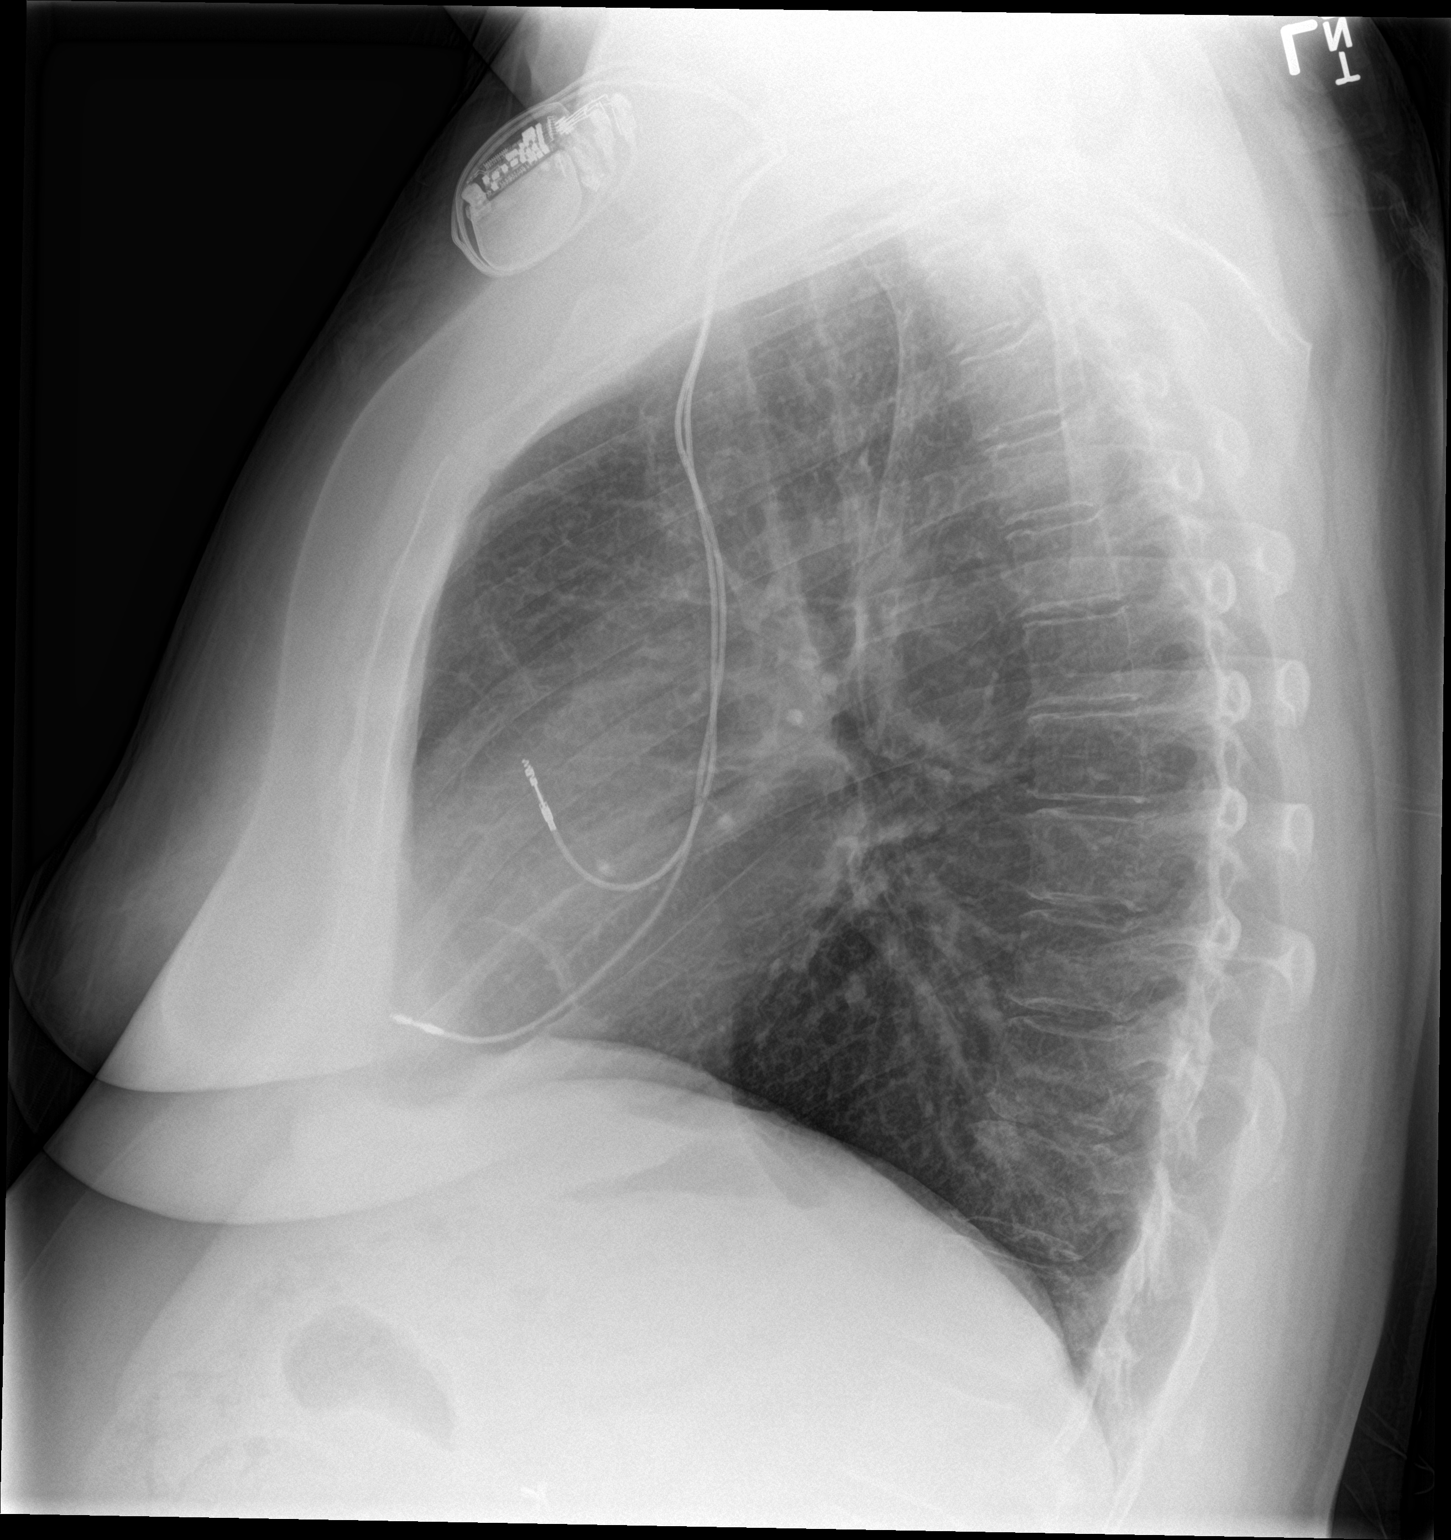

[2 of 2 positions shown; findings below may reference images not displayed]

FINDINGS: Cardiac silhouette is normal in size and configuration. Normal
mediastinal and hilar contours. Left anterior chest wall sequential
pacemaker is well positioned and stable.

Clear lungs.  No pleural effusion or pneumothorax.

Skeletal structures are unremarkable.
IMPRESSION: No active cardiopulmonary disease.

## 2018-12-10 LAB — CUP PACEART REMOTE DEVICE CHECK
Battery Remaining Longevity: 86 mo
Battery Remaining Percentage: 65 %
Battery Voltage: 2.9 V
Brady Statistic AS VS Percent: 98 %
Brady Statistic RA Percent Paced: 1.2 %
Implantable Lead Implant Date: 20111130
Implantable Lead Location: 753859
Implantable Lead Location: 753860
Implantable Pulse Generator Implant Date: 20111130
Lead Channel Impedance Value: 390 Ohm
Lead Channel Impedance Value: 410 Ohm
Lead Channel Pacing Threshold Amplitude: 0.75 V
Lead Channel Pacing Threshold Pulse Width: 0.4 ms
Lead Channel Pacing Threshold Pulse Width: 0.4 ms
Lead Channel Sensing Intrinsic Amplitude: 3.6 mV
Lead Channel Setting Pacing Amplitude: 2.125
MDC IDC LEAD IMPLANT DT: 20111130
MDC IDC MSMT LEADCHNL RV PACING THRESHOLD AMPLITUDE: 1.875 V
MDC IDC MSMT LEADCHNL RV SENSING INTR AMPL: 3.7 mV
MDC IDC PG SERIAL: 7192850
MDC IDC SESS DTM: 20191028061215
MDC IDC SET LEADCHNL RA PACING AMPLITUDE: 1.75 V
MDC IDC SET LEADCHNL RV PACING PULSEWIDTH: 0.4 ms
MDC IDC SET LEADCHNL RV SENSING SENSITIVITY: 2 mV
MDC IDC STAT BRADY AP VP PERCENT: 1 %
MDC IDC STAT BRADY AP VS PERCENT: 1.3 %
MDC IDC STAT BRADY AS VP PERCENT: 1 %
MDC IDC STAT BRADY RV PERCENT PACED: 1 %

## 2018-12-11 ENCOUNTER — Ambulatory Visit: Payer: Managed Care, Other (non HMO) | Admitting: Family Medicine

## 2018-12-11 ENCOUNTER — Encounter: Payer: Self-pay | Admitting: Family Medicine

## 2018-12-11 ENCOUNTER — Ambulatory Visit (INDEPENDENT_AMBULATORY_CARE_PROVIDER_SITE_OTHER): Payer: Managed Care, Other (non HMO) | Admitting: Family Medicine

## 2018-12-11 VITALS — BP 138/84 | Temp 99.0°F | Ht 66.0 in | Wt 230.2 lb

## 2018-12-11 DIAGNOSIS — J019 Acute sinusitis, unspecified: Secondary | ICD-10-CM | POA: Diagnosis not present

## 2018-12-11 DIAGNOSIS — R509 Fever, unspecified: Secondary | ICD-10-CM

## 2018-12-11 DIAGNOSIS — J029 Acute pharyngitis, unspecified: Secondary | ICD-10-CM

## 2018-12-11 LAB — POCT RAPID STREP A (OFFICE): RAPID STREP A SCREEN: NEGATIVE

## 2018-12-11 MED ORDER — AMOXICILLIN-POT CLAVULANATE 875-125 MG PO TABS: 1.0000 | ORAL_TABLET | Freq: Two times a day (BID) | ORAL | 0 refills | Status: DC

## 2018-12-11 NOTE — Progress Notes (Signed)
   Subjective:    Patient ID: [name], female    DOB: February 15, 1961, 57 y.o.   MRN: [id number]  Cough  This is a new problem. Episode onset: 4 days. Associated symptoms include rhinorrhea. Pertinent negatives include no chest pain, ear pain, fever, shortness of breath or wheezing. Associated symptoms comments: Headache, vomiting, coughing, sneezing, swollen glands. Treatments tried: otc sudafed.   Exposure to strep.  Patient denies excessively sore throat denies high fever chills sweats wheezing difficulty breathing  Results for orders placed or performed in visit on 12/11/18  POCT rapid strep A  Result Value Ref Range   Rapid Strep A Screen Negative Negative     Review of Systems  Constitutional: Negative for activity change and fever.  HENT: Positive for congestion and rhinorrhea. Negative for ear pain.   Eyes: Negative for discharge.  Respiratory: Positive for cough. Negative for shortness of breath and wheezing.   Cardiovascular: Negative for chest pain.       Objective:   Physical Exam Vitals signs and nursing note reviewed.  Constitutional:      Appearance: She is well-developed.  HENT:     Head: Normocephalic.     Nose: Nose normal.     Mouth/Throat:     Pharynx: No oropharyngeal exudate.  Neck:     Musculoskeletal: Neck supple.  Cardiovascular:     Rate and Rhythm: Normal rate.     Heart sounds: Normal heart sounds. No murmur.  Pulmonary:     Effort: Pulmonary effort is normal.     Breath sounds: Normal breath sounds. No wheezing.  Lymphadenopathy:     Cervical: No cervical adenopathy.  Skin:    General: Skin is warm and dry.           Assessment & Plan:  Viral syndrome Secondary rhinosinusitis Antibiotics prescribed warning signs discussed follow-up if ongoing troubles Patient was seen today for upper respiratory illness. It is felt that the patient is dealing with sinusitis.  Antibiotics were prescribed today. Importance of compliance with  medication was discussed.  Symptoms should gradually resolve over the course of the next several days. If high fevers, progressive illness, difficulty breathing, worsening condition or failure for symptoms to improve over the next several days then the patient is to follow-up.  If any emergent conditions the patient is to follow-up in the emergency department otherwise to follow-up in the office.

## 2018-12-23 ENCOUNTER — Encounter: Payer: Self-pay | Admitting: Family Medicine

## 2018-12-24 ENCOUNTER — Other Ambulatory Visit: Payer: Self-pay | Admitting: Family Medicine

## 2018-12-24 MED ORDER — CEFDINIR 300 MG PO CAPS: ORAL_CAPSULE | ORAL | 0 refills | Status: DC

## 2018-12-28 ENCOUNTER — Telehealth: Payer: Self-pay | Admitting: Family Medicine

## 2018-12-28 ENCOUNTER — Other Ambulatory Visit: Payer: Self-pay | Admitting: Family Medicine

## 2018-12-28 MED ORDER — PREDNISONE 20 MG PO TABS: ORAL_TABLET | ORAL | 0 refills | Status: DC

## 2018-12-28 NOTE — Telephone Encounter (Signed)
Please advise. Thank you

## 2018-12-28 NOTE — Telephone Encounter (Signed)
Please forward to Dr. Nicki Reaper, he saw this patient last. Thank you!

## 2018-12-28 NOTE — Telephone Encounter (Signed)
Prednisone 20 mg,#12, 3qd for 2d then 2qd for 2d then 1qd for 2d  Fu if ongoing

## 2018-12-28 NOTE — Telephone Encounter (Signed)
Pt is on her second round of antibitics and is very congested, coughing. And would like to know if prednisone can be called in for her. She states she is coughing so much she is to the point of almost throwing up to get the mucus to come up.    Please send to [facility], [place] [address]  CB# [phone]

## 2018-12-31 ENCOUNTER — Other Ambulatory Visit: Payer: Self-pay | Admitting: Family Medicine

## 2018-12-31 ENCOUNTER — Ambulatory Visit (INDEPENDENT_AMBULATORY_CARE_PROVIDER_SITE_OTHER): Payer: Managed Care, Other (non HMO) | Admitting: Family Medicine

## 2018-12-31 ENCOUNTER — Ambulatory Visit (HOSPITAL_COMMUNITY)
Admission: RE | Admit: 2018-12-31 | Discharge: 2018-12-31 | Disposition: A | Discharge status: Home / Self Care | Payer: Managed Care, Other (non HMO) | Source: Ambulatory Visit | Attending: Family Medicine | Admitting: Family Medicine

## 2018-12-31 ENCOUNTER — Encounter: Payer: Self-pay | Admitting: Family Medicine

## 2018-12-31 ENCOUNTER — Telehealth: Payer: Self-pay | Admitting: *Deleted

## 2018-12-31 VITALS — Temp 98.1°F | Wt 225.6 lb

## 2018-12-31 DIAGNOSIS — J189 Pneumonia, unspecified organism: Secondary | ICD-10-CM

## 2018-12-31 DIAGNOSIS — J181 Lobar pneumonia, unspecified organism: Secondary | ICD-10-CM | POA: Diagnosis present

## 2018-12-31 MED ORDER — CLARITHROMYCIN 500 MG PO TABS: 500.0000 mg | ORAL_TABLET | Freq: Two times a day (BID) | ORAL | 0 refills | Status: DC

## 2018-12-31 MED ORDER — METHYLPREDNISOLONE ACETATE 40 MG/ML IJ SUSP
40.0000 mg | Freq: Once | INTRAMUSCULAR | Status: AC
  Administered 2018-12-31: 40 mg via INTRAMUSCULAR

## 2018-12-31 MED ORDER — CEFTRIAXONE SODIUM 500 MG IJ SOLR
500.0000 mg | Freq: Once | INTRAMUSCULAR | Status: AC
  Administered 2018-12-31: 500 mg via INTRAMUSCULAR

## 2018-12-31 NOTE — Telephone Encounter (Signed)
stacey from [place] [facility] calling to give stat report on pt. Please review in epic

## 2018-12-31 NOTE — Telephone Encounter (Signed)
Pt states she did get prednisone last Friday and someone called her. She states she is not any better and already called this morning to schedule an appt. She has one at 9:45

## 2018-12-31 NOTE — Patient Instructions (Signed)
Steps to Quit Smoking    Smoking tobacco can be bad for your health. It can also affect almost every organ in your body. Smoking puts you and people around you at risk for many serious long-lasting (chronic) diseases. Quitting smoking is hard, but it is one of the best things that you can do for your health. It is never too late to quit.  What are the benefits of quitting smoking?  When you quit smoking, you lower your risk for getting serious diseases and conditions. They can include:  · Lung cancer or lung disease.  · Heart disease.  · Stroke.  · Heart attack.  · Not being able to have children (infertility).  · Weak bones (osteoporosis) and broken bones (fractures).  If you have coughing, wheezing, and shortness of breath, those symptoms may get better when you quit. You may also get sick less often. If you are pregnant, quitting smoking can help to lower your chances of having a baby of low birth weight.  What can I do to help me quit smoking?  Talk with your doctor about what can help you quit smoking. Some things you can do (strategies) include:  · Quitting smoking totally, instead of slowly cutting back how much you smoke over a period of time.  · Going to in-person counseling. You are more likely to quit if you go to many counseling sessions.  · Using resources and support systems, such as:  ? Online chats with a counselor.  ? Phone quitlines.  ? Printed self-help materials.  ? Support groups or group counseling.  ? Text messaging programs.  ? Mobile phone apps or applications.  · Taking medicines. Some of these medicines may have nicotine in them. If you are pregnant or breastfeeding, do not take any medicines to quit smoking unless your doctor says it is okay. Talk with your doctor about counseling or other things that can help you.  Talk with your doctor about using more than one strategy at the same time, such as taking medicines while you are also going to in-person counseling. This can help make  quitting easier.  What things can I do to make it easier to quit?  Quitting smoking might feel very hard at first, but there is a lot that you can do to make it easier. Take these steps:  · Talk to your family and friends. Ask them to support and encourage you.  · Call phone quitlines, reach out to support groups, or work with a counselor.  · Ask people who smoke to not smoke around you.  · Avoid places that make you want (trigger) to smoke, such as:  ? Bars.  ? Parties.  ? Smoke-break areas at work.  · Spend time with people who do not smoke.  · Lower the stress in your life. Stress can make you want to smoke. Try these things to help your stress:  ? Getting regular exercise.  ? Deep-breathing exercises.  ? Yoga.  ? Meditating.  ? Doing a body scan. To do this, close your eyes, focus on one area of your body at a time from head to toe, and notice which parts of your body are tense. Try to relax the muscles in those areas.  · Download or buy apps on your mobile phone or tablet that can help you stick to your quit plan. There are many free apps, such as QuitGuide from the CDC (Centers for Disease Control and Prevention). You can find more   support from smokefree.gov and other websites.  This information is not intended to replace advice given to you by your health care provider. Make sure you discuss any questions you have with your health care provider.  Document Released: 09/24/2009 Document Revised: 07/26/2016 Document Reviewed: 04/14/2015  Elsevier Interactive Patient Education © 2019 [employer]

## 2018-12-31 NOTE — Progress Notes (Signed)
   Subjective:    Patient ID: [name], female    DOB: 03-Mar-1961, 58 y.o.   MRN: [id number]  Cough  This is a new problem. The current episode started 1 to 4 weeks ago. The cough is non-productive. Associated symptoms include headaches, rhinorrhea and a sore throat. Pertinent negatives include no chest pain, ear pain, fever, shortness of breath or wheezing. Associated symptoms comments: Congestion, vomiting, body aches, abdominal pain. Treatments tried: 2 ABTs, Prednisone, Mucinex DM  The treatment provided no relief.   Patient has been through 2 rounds of antibiotic recent viral illness have a lot of chest congestion coughing feels short winded at times denies high fever but does relate some low-grade fever and chills states energy level subpar   Review of Systems  Constitutional: Negative for activity change and fever.  HENT: Positive for congestion, rhinorrhea and sore throat. Negative for ear pain.   Eyes: Negative for discharge.  Respiratory: Positive for cough. Negative for shortness of breath and wheezing.   Cardiovascular: Negative for chest pain.  Neurological: Positive for headaches.       Objective:   Physical Exam Vitals signs and nursing note reviewed.  Constitutional:      Appearance: She is well-developed.  HENT:     Head: Normocephalic.     Nose: Nose normal.     Mouth/Throat:     Pharynx: No oropharyngeal exudate.  Neck:     Musculoskeletal: Neck supple.  Cardiovascular:     Rate and Rhythm: Normal rate.     Heart sounds: Normal heart sounds. No murmur.  Pulmonary:     Effort: Pulmonary effort is normal.     Breath sounds: Normal breath sounds. No wheezing.  Lymphadenopathy:     Cervical: No cervical adenopathy.  Skin:    General: Skin is warm and dry.    Patient has crackles in the right lower lobe along with chest congestion no respiratory distress O2 saturation 94%       Assessment & Plan:  Acute rhinosinusitis Acute bronchitis Recommend  going ahead with treatment of antibiotics Rocephin Stat chest x-ray Depo-Medrol 40 mg Patient was strongly encouraged to quit smoking she is not interested in starting medication at this point she will think about it and let us know

## 2018-12-31 NOTE — Telephone Encounter (Signed)
Please see result note thank you 

## 2019-01-07 ENCOUNTER — Ambulatory Visit (INDEPENDENT_AMBULATORY_CARE_PROVIDER_SITE_OTHER): Payer: Managed Care, Other (non HMO)

## 2019-01-07 DIAGNOSIS — I442 Atrioventricular block, complete: Secondary | ICD-10-CM | POA: Diagnosis not present

## 2019-01-07 DIAGNOSIS — R55 Syncope and collapse: Secondary | ICD-10-CM

## 2019-01-08 NOTE — Progress Notes (Signed)
Remote pacemaker transmission.   

## 2019-01-10 ENCOUNTER — Ambulatory Visit (INDEPENDENT_AMBULATORY_CARE_PROVIDER_SITE_OTHER): Payer: Managed Care, Other (non HMO) | Admitting: Family Medicine

## 2019-01-10 ENCOUNTER — Encounter: Payer: Self-pay | Admitting: Family Medicine

## 2019-01-10 VITALS — Temp 98.2°F | Ht 66.0 in | Wt 225.2 lb

## 2019-01-10 DIAGNOSIS — J209 Acute bronchitis, unspecified: Secondary | ICD-10-CM | POA: Diagnosis not present

## 2019-01-10 DIAGNOSIS — J440 Chronic obstructive pulmonary disease with acute lower respiratory infection: Secondary | ICD-10-CM | POA: Diagnosis not present

## 2019-01-10 LAB — CUP PACEART REMOTE DEVICE CHECK
Battery Remaining Longevity: 76 mo
Brady Statistic AP VP Percent: 1 %
Brady Statistic AP VS Percent: 1 %
Brady Statistic AS VP Percent: 1 %
Brady Statistic AS VS Percent: 99 %
Brady Statistic RA Percent Paced: 1 %
Implantable Lead Location: 753859
Lead Channel Impedance Value: 400 Ohm
Lead Channel Impedance Value: 430 Ohm
Lead Channel Pacing Threshold Amplitude: 2 V
Lead Channel Pacing Threshold Pulse Width: 0.4 ms
Lead Channel Sensing Intrinsic Amplitude: 4.5 mV
Lead Channel Setting Pacing Amplitude: 1.75 V
MDC IDC LEAD IMPLANT DT: 20111130
MDC IDC LEAD IMPLANT DT: 20111130
MDC IDC LEAD LOCATION: 753860
MDC IDC MSMT BATTERY REMAINING PERCENTAGE: 57 %
MDC IDC MSMT BATTERY VOLTAGE: 2.89 V
MDC IDC MSMT LEADCHNL RA PACING THRESHOLD AMPLITUDE: 0.75 V
MDC IDC MSMT LEADCHNL RA SENSING INTR AMPL: 4.1 mV
MDC IDC MSMT LEADCHNL RV PACING THRESHOLD PULSEWIDTH: 0.4 ms
MDC IDC PG IMPLANT DT: 20111130
MDC IDC PG SERIAL: 7192850
MDC IDC SESS DTM: 20200127084001
MDC IDC SET LEADCHNL RV PACING AMPLITUDE: 2.25 V
MDC IDC SET LEADCHNL RV PACING PULSEWIDTH: 0.4 ms
MDC IDC SET LEADCHNL RV SENSING SENSITIVITY: 2 mV
MDC IDC STAT BRADY RV PERCENT PACED: 1 %

## 2019-01-10 MED ORDER — ALBUTEROL SULFATE HFA 108 (90 BASE) MCG/ACT IN AERS: 2.0000 | INHALATION_SPRAY | RESPIRATORY_TRACT | 3 refills | Status: DC | PRN

## 2019-01-10 MED ORDER — FLUTICASONE PROPIONATE HFA 110 MCG/ACT IN AERO: 2.0000 | INHALATION_SPRAY | Freq: Two times a day (BID) | RESPIRATORY_TRACT | 3 refills | Status: DC

## 2019-01-10 NOTE — Progress Notes (Signed)
   Subjective:    Patient ID: [name], female    DOB: 1961-09-14, 58 y.o.   MRN: [id number]  Cough  This is a new problem. The current episode started in the past 7 days. Associated symptoms include nasal congestion and wheezing. Associated symptoms comments: Lost voice. Treatments tried: antibiotic, steroids, mucinex and flonase.   Persistent bad cough, occasional productive chest feels tight give you so much steroids to try to muscle spasm and tightness 3 had multiple rounds of antibiotics and steroids   No fevers appetite decent  Ongoing aggravating cough  Also hoarse the past week.   Review of Systems  Respiratory: Positive for cough and wheezing.        Objective:   Physical Exam  Alert active intermittent cough.  Voice hoarse lungs bilateral mild wheezes no tachypnea no crackles heart regular rate and      Assessment & Plan:  Impression persistent bronchial symptoms with persistent reactive airways.  Ongoing smoker.  Of note has CT scan of the chest last October.  So highly doubt a focal pneumonia seen on regular x-ray 10 days ago could be anything more serious.  However patient must stop smoking.  Add Flovent 2 sprays twice daily no further antibiotics at this time rationale discussed expect gradual resolution.  Patient advised she certainly does have an element of COPD at this point

## 2019-01-24 ENCOUNTER — Telehealth: Payer: Self-pay | Admitting: Family Medicine

## 2019-01-24 NOTE — Telephone Encounter (Signed)
Per pt she states she is not running a fever,but still has a cough and losing her voice. She was transferred to the front to schedule a recheck visit for tomorrow 01/24/2019.

## 2019-01-24 NOTE — Telephone Encounter (Signed)
Pt was seen 1/20 and 1/30. She is still coughing and hasnt had a voice for 3 weeks. She would like to know what to do.

## 2019-01-24 NOTE — Telephone Encounter (Signed)
I called and left a message to r/c to see if running a temp or any other symptoms.

## 2019-01-25 ENCOUNTER — Encounter: Payer: Self-pay | Admitting: Family Medicine

## 2019-01-25 ENCOUNTER — Ambulatory Visit (INDEPENDENT_AMBULATORY_CARE_PROVIDER_SITE_OTHER): Payer: Managed Care, Other (non HMO) | Admitting: Family Medicine

## 2019-01-25 VITALS — BP 124/80 | Temp 99.0°F | Ht 66.0 in | Wt 221.1 lb

## 2019-01-25 DIAGNOSIS — R05 Cough: Secondary | ICD-10-CM | POA: Diagnosis not present

## 2019-01-25 DIAGNOSIS — R490 Dysphonia: Secondary | ICD-10-CM

## 2019-01-25 DIAGNOSIS — R053 Chronic cough: Secondary | ICD-10-CM

## 2019-01-25 NOTE — Progress Notes (Signed)
   Subjective:    Patient ID: [name], female    DOB: [birth year], 58 y.o.   MRN: [id number]  HPI  Patient is here today as a follow up on her last visit on January 30 ,2020. She was diagnosed with bronchitis and likely COPD. Dr. Richardson Landry did not feel repeat chest x-ray was necessary d/t CT san results from 10/01/18.  She states she still has a cough, no energy,and loss of voice x 3 weeks. Dry cough. No shortness of breath. Taking flovent bid, albuterol prn - about 1 time per day. No fever.   She does feel some better.   Review of Systems  Constitutional: Negative for chills and fever.  HENT: Positive for voice change. Negative for congestion, ear pain and trouble swallowing.   Eyes: Negative for discharge.  Respiratory: Positive for cough. Negative for shortness of breath and wheezing.   Gastrointestinal: Negative for diarrhea and vomiting.  Musculoskeletal: Negative for myalgias.  Neurological: Negative for headaches.       Objective:   Physical Exam Vitals signs and nursing note reviewed.  Constitutional:      General: She is not in acute distress.    Appearance: Normal appearance. She is not toxic-appearing.  HENT:     Head: Normocephalic and atraumatic.     Right Ear: Tympanic membrane normal.     Left Ear: Tympanic membrane normal.     Nose: Nose normal.     Mouth/Throat:     Mouth: Mucous membranes are moist.     Pharynx: Oropharynx is clear.  Eyes:     General:        Right eye: No discharge.        Left eye: No discharge.  Neck:     Musculoskeletal: Neck supple. No neck rigidity.  Cardiovascular:     Rate and Rhythm: Normal rate and regular rhythm.     Heart sounds: Normal heart sounds.  Pulmonary:     Effort: Pulmonary effort is normal. No respiratory distress.     Breath sounds: Normal breath sounds. No wheezing or rales.  Lymphadenopathy:     Cervical: No cervical adenopathy.  Skin:    General: Skin is warm and dry.  Neurological:     Mental  Status: She is alert and oriented to person, place, and time.  Psychiatric:        Behavior: Behavior normal.           Assessment & Plan:  1. Chronic cough - Plan: Ambulatory referral to Pulmonology Pt with likely subacute cough r/t bronchitis. Also likely COPD. Continue flovent and albuterol. Since has been ongoing for several weeks now and significant smoking hx will refer to pulmonology for further evaluation and treatment. Reviewed with Dr. Richardson Landry, he does not feel repeat chest x-ray is necessary d/t CT scan from Oct. 2019.  2. Persistent hoarseness - Plan: Ambulatory referral to ENT Hoarseness x 3 weeks, given smoking hx recommend further eval by ENT specialist.   F/u if symptoms worsen or fail to improve.   Dr. Mickie Hillier was consulted on this case and is in agreement with the above treatment plan.

## 2019-01-31 ENCOUNTER — Encounter: Payer: Self-pay | Admitting: Family Medicine

## 2019-02-07 ENCOUNTER — Encounter: Payer: Self-pay | Admitting: Pulmonary Disease

## 2019-02-07 ENCOUNTER — Ambulatory Visit (INDEPENDENT_AMBULATORY_CARE_PROVIDER_SITE_OTHER): Payer: 59 | Admitting: Pulmonary Disease

## 2019-02-07 VITALS — BP 116/78 | HR 78 | Ht 66.0 in | Wt 221.0 lb

## 2019-02-07 DIAGNOSIS — Z720 Tobacco use: Secondary | ICD-10-CM | POA: Diagnosis not present

## 2019-02-07 DIAGNOSIS — R05 Cough: Secondary | ICD-10-CM | POA: Diagnosis not present

## 2019-02-07 DIAGNOSIS — R053 Chronic cough: Secondary | ICD-10-CM

## 2019-02-07 MED ORDER — UMECLIDINIUM BROMIDE 62.5 MCG/INH IN AEPB: 1.0000 | INHALATION_SPRAY | Freq: Every day | RESPIRATORY_TRACT | 0 refills | Status: DC

## 2019-02-07 NOTE — Progress Notes (Signed)
Subjective:   PATIENT ID: [name] GENDER: female DOB: 20-Dec-1960, MRN: [id number]   HPI  Chief Complaint  Patient presents with  . Consult    Referred by MD Wolfgang Phoenix for chronic cough. She states she had a cold that turned into Bronchitis. She is now hoarse, sob, intermittent chest heaviness, and dry cough since new years.     Reason for Visit: New Consult for chronic cough  [name] is a 58 year old female active smoker with chronic cough.  She developed a cold over [place] Year's that developed to sinusitis. However after her congestion improved she continued to have progressively worsening nonproductive cough over the next two months. Reports associated chest congestion and chest heaviness but unable to get any sputum to come up. Cough occurs during night and day. Nothing improves hers symptoms.  Tried cough syrup and OTC meds without relief. She is actively smoking and acknowledges this does not improve her cough. Her frequent coughing has led to hoarseness which is worsened by the fact that she needs to talk to her grand children on a daily basis. Denies fevers, chills, hemoptysis, weight loss, night sweats, chest pain, reflux, abdominal pain or lower extremity edema.   Denies prior history of bronchitis. Prior to these symptoms she had dyspnea on heavy exertion. Associated with wheezing. Denies chronic cough before.  Started flovent and albuterol one week ago and has not noticed any changed.   Dad had throat and lung cancer. Brother had lung cancer.  Social History: Current smoker. 2ppd x 20 years, then decreased to 1ppd x 16y. Currently 1/2ppd. 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  I have personally reviewed patient's past medical/family/social history, allergies, current medications.  Past Medical History:  Diagnosis Date  . Anxiety   . Arthritis   . Depression    bipolar disorder  .  Esophageal stricture   . GERD (gastroesophageal reflux disease)   . Heart defect   . HLD (hyperlipidemia)   . Hypothyroidism   . IBS (irritable bowel syndrome)   . IFG (impaired fasting glucose)   . Migraine headache   . OSA (obstructive sleep apnea)   . Reflux   . Sleep apnea   . Symptomatic bradycardia      Family History  Problem Relation Age of Onset  . Throat cancer Father        squamous cell carcinoma  . Tongue cancer Father   . Esophageal cancer Father   . Arthritis Mother   . Heart disease Mother        coronary; female < 84  . Asthma Mother   . Colon cancer Neg Hx      Social History   Occupational History  . Occupation: retired    Fish farm manager: [employer]  Tobacco Use  . Smoking status: Current Every Day Smoker    Packs/day: 2.00    Years: 25.00    Pack years: 50.00    Types: Cigarettes  . Smokeless tobacco: Never Used  . Tobacco comment: tobacco info givin 03/08/16, smokes half pack per day 02.27.20  Substance and Sexual Activity  . Alcohol use: No    Alcohol/week: 0.0 standard drinks    Comment: Occ.   . Drug use: No  . Sexual activity: Not on file    Allergies  Allergen Reactions  . Codeine   . Sulfonamide Derivatives   . Betadine [Povidone Iodine] Other (See Comments)  Blisters   . Paxil [Paroxetine Hcl] Other (See Comments)    Extreme sweating  . Vioxx [Rofecoxib] Rash     Outpatient Medications Prior to Visit  Medication Sig Dispense Refill  . albuterol (PROVENTIL HFA;VENTOLIN HFA) 108 (90 Base) MCG/ACT inhaler Inhale 2 puffs into the lungs every 4 (four) hours as needed for wheezing or shortness of breath. 1 Inhaler 3  . ALPRAZolam (XANAX) 1 MG tablet TAKE ONE-HALF TABLET BY MOUTH TWICE DAILY AS NEEDED FOR ANXIETY OR SLEEP (Patient taking differently: Take 0.5 mg by mouth 2 (two) times daily as needed for anxiety. ) 30 tablet 5  . fluticasone (FLONASE) 50 MCG/ACT nasal spray Place 1 spray into both nostrils daily. 16 g 5  . levothyroxine  (SYNTHROID, LEVOTHROID) 75 MCG tablet TAKE 1 TABLET BY MOUTH ONCE DAILY (NEEDS OFFICE VISIT) 90 tablet 1  . omeprazole (PRILOSEC) 20 MG capsule TAKE ONE CAPSULE BY MOUTH TWICE DAILY (Patient taking differently: Take 20 mg by mouth 2 (two) times daily before a meal. ) 60 capsule 5  . rosuvastatin (CRESTOR) 10 MG tablet TAKE 1 TABLET BY MOUTH ONCE DAILY FOR CHOLESTEROL (Patient taking differently: Take 10 mg by mouth daily. ) 90 tablet 1  . sertraline (ZOLOFT) 100 MG tablet Take 1.5 tablets (150 mg total) by mouth daily. 135 tablet 1  . levothyroxine (SYNTHROID, LEVOTHROID) 75 MCG tablet TAKE 1 TABLET BY MOUTH ONCE DAILY (Patient taking differently: Take 75 mcg by mouth daily before breakfast. ) 30 tablet 5  . Vitamin D, Ergocalciferol, (DRISDOL) 50000 units CAPS capsule Take 1 capsule (50,000 Units total) by mouth every 7 (seven) days. 4 capsule 2  . clarithromycin (BIAXIN) 500 MG tablet Take 1 tablet (500 mg total) by mouth 2 (two) times daily. (Patient not taking: Reported on 02/07/2019) 20 tablet 0  . diclofenac (VOLTAREN) 75 MG EC tablet Take one tablet by mouth twice daily with food as needed for arm pain 28 tablet 0  . fluticasone (FLOVENT HFA) 110 MCG/ACT inhaler Inhale 2 puffs into the lungs 2 (two) times daily. (Patient not taking: Reported on 02/07/2019) 1 Inhaler 3  . hydrocortisone 2.5 % cream Apply topically 2 (two) times daily. (Patient not taking: Reported on 02/07/2019) 30 g 2  . pantoprazole (PROTONIX) 40 MG tablet Take 1 tablet (40 mg total) by mouth daily. (Patient not taking: Reported on 02/07/2019) 30 tablet 1  . phentermine (ADIPEX-P) 37.5 MG tablet Take 1 tablet (37.5 mg total) by mouth daily before breakfast. (Patient not taking: Reported on 02/07/2019) 30 tablet 2  . predniSONE (DELTASONE) 20 MG tablet Take three tablets by mouth for 2 days then 2 tablets by mouth for 2 days and then one tablet for 2 days (Patient not taking: Reported on 02/07/2019) 12 tablet 0   No  facility-administered medications prior to visit.     Review of Systems  Constitutional: Negative for chills, diaphoresis, fever, malaise/fatigue and weight loss.  HENT: Negative for congestion, ear pain and sore throat.        Hoarseness  Respiratory: Positive for cough, shortness of breath and wheezing. Negative for hemoptysis and sputum production.   Cardiovascular: Negative for chest pain, palpitations and leg swelling.  Gastrointestinal: Negative for abdominal pain, heartburn and nausea.  Genitourinary: Negative for frequency.  Musculoskeletal: Negative for joint pain and myalgias.  Skin: Negative for itching and rash.  Neurological: Negative for dizziness, weakness and headaches.  Endo/Heme/Allergies: Does not bruise/bleed easily.  Psychiatric/Behavioral: Negative for depression. The patient is not nervous/anxious.  Objective:   Vitals:   02/07/19 1524  BP: 116/78  Pulse: 78  SpO2: 96%  Weight: 221 lb (100.2 kg)  Height: 5\' 6"  (1.676 m)   SpO2: 96 %  Physical Exam: General: Well-appearing, no acute distress, hoarse when speaking HENT: [place], AT, OP clear, MMM Eyes: EOMI, no scleral icterus Respiratory: Clear to auscultation bilaterally.  No crackles, wheezing or rales Cardiovascular: RRR, -M/R/G, no JVD GI: BS+, soft, nontender Extremities:-Edema,-tenderness Neuro: AAO x4, CNII-XII grossly intact Skin: Intact, no rashes or bruising Psych: Normal mood, normal affect  Data Reviewed:  Imaging: CXR 12/31/18 - left basilar infiltrate CXR 10/03/19 - no infiltrate, effusion or edema  PFT: None on file  Labs:  Imaging, labs and tests noted above have been reviewed independently by me.    Assessment & Plan:   Discussion: 58 year old female active smoker who presents with chronic cough and hoarseness x 2 months. Prior to these symptoms, she had baseline dyspnea on exertion and wheezing that may represent bronchitis +/- underlying obstructive lung disease, may  benefit from LAMA rather than ICS inhaler. Also reports symptoms suggestive of UACS.  Chronic Cough -STOP Flovent -START Incruse 62.5 mcg one inhalation daily -USE Albuterol inhaler 2 puffs every 6 hours as needed for shortness of breath or wheezing -CONTINUE Flonase 1 spray per nare daily -INCREASE omeprazole 40 mg twice a day for 6 weeks -Keep ENT follow-up -Advised resting voice as much as possible (difficult with two grandchildren at home) -Counseled to quit smoking  We will schedule pulmonary function tests prior to your next visit.  Health Maintenance Pneumonia No Due Influenza 09/26/2018  Orders Placed This Encounter  Procedures  . Pulmonary function test    Standing Status:   Future    Standing Expiration Date:   02/08/2020    Order Specific Question:   Where should this test be performed?    Answer:   [facility]    Order Specific Question:   Full PFT: includes the following: basic spirometry, spirometry pre & post bronchodilator, diffusion capacity (DLCO), lung volumes    Answer:   Full PFT   Meds ordered this encounter  Medications  . umeclidinium bromide (INCRUSE ELLIPTA) 62.5 MCG/INH AEPB    Sig: Inhale 1 puff into the lungs daily.    Dispense:  28 each    Refill:  0    Order Specific Question:   Lot Number?    Answer:   [id number]    Order Specific Question:   Expiration Date?    Answer:   01/11/2020    Order Specific Question:   Manufacturer?    Answer:   [employer] [[id number]]    Order Specific Question:   Quantity    Answer:   4    Comments:   samples    Return in about 1 month (around 03/08/2019).  [place], MD [facility] 02/07/2019 8:27 PM  Office Number [phone]

## 2019-02-07 NOTE — Patient Instructions (Signed)
Chronic Cough -STOP Flovent -START Incruse 62.5 mcg one inhalation daily -USE Albuterol inhaler 2 puffs every 6 hours as needed for shortness of breath or wheezing -CONTINUE Flonase 1 spray per nare daily -INCREASE omeprazole 40 mg twice a day for 6 weeks  We will schedule pulmonary function tests prior to your next visit.  Follow-up with me in 1 month

## 2019-02-11 ENCOUNTER — Other Ambulatory Visit: Payer: Self-pay | Admitting: Family Medicine

## 2019-02-13 NOTE — Telephone Encounter (Signed)
Six mo apraz, one yr prilosec

## 2019-02-28 ENCOUNTER — Ambulatory Visit (INDEPENDENT_AMBULATORY_CARE_PROVIDER_SITE_OTHER): Payer: Managed Care, Other (non HMO) | Admitting: Otolaryngology

## 2019-02-28 DIAGNOSIS — R490 Dysphonia: Secondary | ICD-10-CM | POA: Diagnosis not present

## 2019-02-28 DIAGNOSIS — F17210 Nicotine dependence, cigarettes, uncomplicated: Secondary | ICD-10-CM

## 2019-02-28 DIAGNOSIS — J382 Nodules of vocal cords: Secondary | ICD-10-CM | POA: Diagnosis not present

## 2019-03-02 ENCOUNTER — Other Ambulatory Visit: Payer: Self-pay | Admitting: Family Medicine

## 2019-03-05 ENCOUNTER — Telehealth: Payer: Self-pay | Admitting: Pulmonary Disease

## 2019-03-06 NOTE — Telephone Encounter (Signed)
No documentation on this note  Tanya- did we need to f/u with this pt?

## 2019-03-08 NOTE — Telephone Encounter (Signed)
It seems like encounter was open in error so closing encounter. 

## 2019-03-12 ENCOUNTER — Ambulatory Visit: Payer: 59 | Admitting: Pulmonary Disease

## 2019-03-12 ENCOUNTER — Telehealth: Payer: Self-pay | Admitting: Pulmonary Disease

## 2019-03-12 MED ORDER — UMECLIDINIUM BROMIDE 62.5 MCG/INH IN AEPB: 1.0000 | INHALATION_SPRAY | Freq: Every day | RESPIRATORY_TRACT | 3 refills | Status: DC

## 2019-03-12 NOTE — Telephone Encounter (Signed)
Rx Incruse sent to [facility]. Pt has PFT/office visit with JE in June 2020. Pt notified  Nothing further needed.

## 2019-04-08 ENCOUNTER — Other Ambulatory Visit: Payer: Self-pay

## 2019-04-08 ENCOUNTER — Ambulatory Visit (INDEPENDENT_AMBULATORY_CARE_PROVIDER_SITE_OTHER): Payer: 59 | Admitting: *Deleted

## 2019-04-08 DIAGNOSIS — I442 Atrioventricular block, complete: Secondary | ICD-10-CM | POA: Diagnosis not present

## 2019-04-08 DIAGNOSIS — R55 Syncope and collapse: Secondary | ICD-10-CM

## 2019-04-08 LAB — CUP PACEART REMOTE DEVICE CHECK
Battery Remaining Longevity: 77 mo
Battery Remaining Percentage: 57 %
Battery Voltage: 2.89 V
Brady Statistic AP VP Percent: 1 %
Brady Statistic AP VS Percent: 1.3 %
Brady Statistic AS VP Percent: 1 %
Brady Statistic AS VS Percent: 99 %
Brady Statistic RA Percent Paced: 1.2 %
Brady Statistic RV Percent Paced: 1 %
Date Time Interrogation Session: 20200427091050
Implantable Lead Implant Date: 20111130
Implantable Lead Implant Date: 20111130
Implantable Lead Location: 753859
Implantable Lead Location: 753860
Implantable Pulse Generator Implant Date: 20111130
Lead Channel Impedance Value: 380 Ohm
Lead Channel Impedance Value: 410 Ohm
Lead Channel Pacing Threshold Amplitude: 0.875 V
Lead Channel Pacing Threshold Amplitude: 1.625 V
Lead Channel Pacing Threshold Pulse Width: 0.4 ms
Lead Channel Pacing Threshold Pulse Width: 0.4 ms
Lead Channel Sensing Intrinsic Amplitude: 3.7 mV
Lead Channel Sensing Intrinsic Amplitude: 3.8 mV
Lead Channel Setting Pacing Amplitude: 1.875
Lead Channel Setting Pacing Amplitude: 1.875
Lead Channel Setting Pacing Pulse Width: 0.4 ms
Lead Channel Setting Sensing Sensitivity: 2 mV
Pulse Gen Model: 2210
Pulse Gen Serial Number: 7192850

## 2019-04-15 NOTE — Progress Notes (Signed)
Remote pacemaker transmission.   

## 2019-04-25 ENCOUNTER — Ambulatory Visit (INDEPENDENT_AMBULATORY_CARE_PROVIDER_SITE_OTHER): Payer: 59 | Admitting: Otolaryngology

## 2019-04-25 ENCOUNTER — Other Ambulatory Visit: Payer: Self-pay

## 2019-04-25 DIAGNOSIS — F17210 Nicotine dependence, cigarettes, uncomplicated: Secondary | ICD-10-CM

## 2019-04-25 DIAGNOSIS — K219 Gastro-esophageal reflux disease without esophagitis: Secondary | ICD-10-CM

## 2019-04-25 DIAGNOSIS — R490 Dysphonia: Secondary | ICD-10-CM | POA: Diagnosis not present

## 2019-05-01 ENCOUNTER — Encounter: Payer: Self-pay | Admitting: Gastroenterology

## 2019-05-23 ENCOUNTER — Other Ambulatory Visit: Payer: Self-pay

## 2019-05-23 ENCOUNTER — Ambulatory Visit (AMBULATORY_SURGERY_CENTER): Payer: Self-pay

## 2019-05-23 VITALS — Ht 66.0 in | Wt 240.0 lb

## 2019-05-23 DIAGNOSIS — Z86010 Personal history of colonic polyps: Secondary | ICD-10-CM

## 2019-05-23 MED ORDER — PEG 3350-KCL-NA BICARB-NACL 420 G PO SOLR: 4000.0000 mL | Freq: Once | ORAL | 0 refills | Status: AC

## 2019-05-23 NOTE — Progress Notes (Signed)
Denies allergies to eggs or soy products. Denies complication of anesthesia or sedation. Denies use of weight loss medication. Denies use of O2.   Emmi instructions given for colonoscopy.  Pre-Visit was conducted by phone due to Covid 19. Instructions were reviewed and mailed to patients confirmed home address. Patient was encouraged to call if she had any questions or concerns regarding instructions.  

## 2019-05-27 ENCOUNTER — Encounter: Payer: Self-pay | Admitting: Gastroenterology

## 2019-05-29 ENCOUNTER — Telehealth (INDEPENDENT_AMBULATORY_CARE_PROVIDER_SITE_OTHER): Payer: 59 | Admitting: Internal Medicine

## 2019-05-29 ENCOUNTER — Other Ambulatory Visit: Payer: Self-pay

## 2019-05-29 DIAGNOSIS — I495 Sick sinus syndrome: Secondary | ICD-10-CM

## 2019-05-29 DIAGNOSIS — Z950 Presence of cardiac pacemaker: Secondary | ICD-10-CM

## 2019-05-29 NOTE — Progress Notes (Signed)
Electrophysiology TeleHealth Note   Due to national recommendations of social distancing due to COVID 19, an audio/video telehealth visit is felt to be most appropriate for this patient at this time.  See MyChart message from today for the patient's consent to telehealth for [facility].   Date:  05/29/2019   ID:  JAQUISHA Fleming, DOB 02-25-1961, MRN [id number]  Location: patient's home  Provider location: [address], [place]  Evaluation Performed: Follow-up visit  PCP:  Mikey Kirschner, MD  Cardiologist:  No primary care provider on file.  Electrophysiologist:  Dr Lovena Le  Chief Complaint: "I am trying to stay safe."  History of Present Illness:    [name] is a 58 y.o. female who presents via audio/video conferencing for a telehealth visit today. She is a pleasant 58 yo woman with a h/o sinus node dysfunction.  Since last being seen in our clinic, the patient reports doing very well.  Today, she denies symptoms of palpitations, chest pain, shortness of breath,  lower extremity edema, dizziness, presyncope, or syncope.  The patient is otherwise without complaint today.  The patient denies symptoms of fevers, chills, cough, or new SOB worrisome for COVID 19.  Past Medical History:  Diagnosis Date   Allergy    Anxiety    Arthritis    Asthma    Cataract    Depression    bipolar disorder   Esophageal stricture    GERD (gastroesophageal reflux disease)    Heart defect    HLD (hyperlipidemia)    Hypothyroidism    IBS (irritable bowel syndrome)    IFG (impaired fasting glucose)    Migraine headache    OSA (obstructive sleep apnea)    Reflux    Sleep apnea    Symptomatic bradycardia     Past Surgical History:  Procedure Laterality Date   ABDOMINAL HERNIA REPAIR     BREAST CYST EXCISION     left   Cesarean sections     x2   CHOLECYSTECTOMY     COLONOSCOPY     dental implants     foor surgery Right    screws     FOOT SURGERY     screws in left foot   PACEMAKER INSERTION     TUBAL LIGATION     VAGINAL HYSTERECTOMY      Current Outpatient Medications  Medication Sig Dispense Refill   albuterol (PROVENTIL HFA;VENTOLIN HFA) 108 (90 Base) MCG/ACT inhaler Inhale 2 puffs into the lungs every 4 (four) hours as needed for wheezing or shortness of breath. 1 Inhaler 3   ALPRAZolam (XANAX) 1 MG tablet TAKE 1/2 (ONE-HALF) TABLET BY MOUTH TWICE DAILY AS NEEDED FOR ANXIETY OR  SLEEP 30 tablet 5   fluticasone (FLONASE) 50 MCG/ACT nasal spray Place 1 spray into both nostrils daily. 16 g 5   levothyroxine (SYNTHROID, LEVOTHROID) 75 MCG tablet TAKE 1 TABLET BY MOUTH ONCE DAILY (NEEDS OFFICE VISIT) 90 tablet 1   omeprazole (PRILOSEC) 20 MG capsule TAKE ONE CAPSULE BY MOUTH TWICE DAILY 60 capsule 11   rosuvastatin (CRESTOR) 10 MG tablet TAKE 1 TABLET BY MOUTH ONCE DAILY FOR CHOLESTEROL 90 tablet 1   sertraline (ZOLOFT) 100 MG tablet Take 1.5 tablets (150 mg total) by mouth daily. 135 tablet 1   umeclidinium bromide (INCRUSE ELLIPTA) 62.5 MCG/INH AEPB Inhale 1 puff into the lungs daily. 30 each 3   Vitamin D, Ergocalciferol, (DRISDOL) 50000 units CAPS capsule Take 1 capsule (50,000  Units total) by mouth every 7 (seven) days. 4 capsule 2   levothyroxine (SYNTHROID, LEVOTHROID) 75 MCG tablet TAKE 1 TABLET BY MOUTH ONCE DAILY (Patient taking differently: Take 75 mcg by mouth daily before breakfast. ) 30 tablet 5   No current facility-administered medications for this visit.     Allergies:   Codeine, Sulfonamide derivatives, Betadine [povidone iodine], Paxil [paroxetine hcl], and Vioxx [rofecoxib]   Social History:  The patient  reports that she has been smoking cigarettes. She has a 50.00 pack-year smoking history. She has never used smokeless tobacco. She reports that she does not drink alcohol or use drugs.   Family History:  The patient's  family history includes Arthritis in her mother; Asthma in her  mother; Esophageal cancer in her father; Heart disease in her mother; Throat cancer in her father; Tongue cancer in her father.   ROS:  Please see the history of present illness.   All other systems are personally reviewed and negative.    Exam:    Vital Signs:  none   Labs/Other Tests and Data Reviewed:    Recent Labs: 06/12/2018: ALT 19; TSH 1.620 10/01/2018: BUN 5; Creatinine, Ser 0.96; Hemoglobin 14.6; Platelets 368; Potassium 3.4; Sodium 140   Wt Readings from Last 3 Encounters:  05/23/19 240 lb (108.9 kg)  02/07/19 221 lb (100.2 kg)  01/25/19 221 lb 0.8 oz (100.3 kg)     Other studies personally reviewed:   Last device remote is reviewed from [facility] PDF dated 6/17 which reveals normal device function, no arrhythmias    ASSESSMENT & PLAN:    1.  Obesity - She has gained 20 lbs from the stress of staying at home. I have encouraged her to start walking and to lose weight. 2. Sinus node dysfunction - she is asymptomatic, s/p PPM insertion. 3. HTN - she is not checking her BP. I encouraged her to do so and to lose weight. 4. COVID 19 screen The patient denies symptoms of COVID 19 at this time.  The importance of social distancing was discussed today.  Follow-up:  6 months Next remote: 7/20  Current medicines are reviewed at length with the patient today.   The patient does not have concerns regarding her medicines.  The following changes were made today:  none  Labs/ tests ordered today include: none No orders of the defined types were placed in this encounter.    Patient Risk:  after full review of this patients clinical status, I feel that they are at moderate risk at this time.  Today, I have spent 15 minutes with the patient with telehealth technology discussing all of the above .    Signed, Cristopher Peru, MD  05/29/2019 9:00 AM     [facility] [place] [phone] (office) [phone] (fax)

## 2019-05-29 NOTE — Patient Instructions (Signed)
Medication Instructions:  Your physician recommends that you continue on your current medications as directed. Please refer to the Current Medication list given to you today.  If you need a refill on your cardiac medications before your next appointment, please call your pharmacy.   Lab work: NONE   If you have labs (blood work) drawn today and your tests are completely normal, you will receive your results only by: Marland Kitchen MyChart Message (if you have MyChart) OR . A paper copy in the mail If you have any lab test that is abnormal or we need to change your treatment, we will call you to review the results.  Testing/Procedures: NONE   Follow-Up: At [facility], you and your health needs are our priority.  As part of our continuing mission to provide you with exceptional heart care, we have created designated Provider Care Teams.  These Care Teams include your primary Cardiologist (physician) and Advanced Practice Providers (APPs -  Physician Assistants and Nurse Practitioners) who all work together to provide you with the care you need, when you need it. You will need a follow up appointment in 6 months.  Please call our office 2 months in advance to schedule this appointment.  You may see Cristopher Peru, MD or one of the following Advanced Practice Providers on your designated Care Team:   Chanetta Marshall, NP . Tommye Standard, PA-C  Remote monitoring is used to monitor your Pacemaker of ICD from home. This monitoring reduces the number of office visits required to check your device to one time per year. It allows [place] to keep an eye on the functioning of your device to ensure it is working properly. You are scheduled for a device check from home on 07/01/19. You may send your transmission at any time that day. If you have a wireless device, the transmission will be sent automatically. After your physician reviews your transmission, you will receive a postcard with your next transmission date.   Any Other  Special Instructions Will Be Listed Below (If Applicable).   Thank you for choosing [facility]!

## 2019-06-03 ENCOUNTER — Ambulatory Visit: Payer: 59 | Admitting: Pulmonary Disease

## 2019-06-05 ENCOUNTER — Telehealth: Payer: Self-pay | Admitting: Gastroenterology

## 2019-06-05 NOTE — Telephone Encounter (Signed)
Spoke w/patient regarding Covid-19 screening questions °Covid-19 Screening Questions: ° °Do you now or have you had a fever in the last 14 days? no ° °Do you have any respiratory symptoms of shortness of breath or cough now or in the last 14 days? no ° °Do you have any family members or close contacts with diagnosed or suspected Covid-19 in the past 14 days? no ° °Have you been tested for Covid-19 and found to be positive? no ° °Pt made aware of that care partner may wait in the car or come up to the lobby during the procedure but will need to provide their own mask. °

## 2019-06-06 ENCOUNTER — Encounter: Payer: Self-pay | Admitting: Gastroenterology

## 2019-06-06 ENCOUNTER — Other Ambulatory Visit: Payer: Self-pay

## 2019-06-06 ENCOUNTER — Ambulatory Visit (AMBULATORY_SURGERY_CENTER): Payer: 59 | Admitting: Gastroenterology

## 2019-06-06 VITALS — BP 132/76 | HR 50 | Temp 98.3°F | Resp 15 | Ht 66.0 in | Wt 221.0 lb

## 2019-06-06 DIAGNOSIS — D123 Benign neoplasm of transverse colon: Secondary | ICD-10-CM | POA: Diagnosis not present

## 2019-06-06 DIAGNOSIS — Z86010 Personal history of colonic polyps: Secondary | ICD-10-CM | POA: Diagnosis present

## 2019-06-06 DIAGNOSIS — D127 Benign neoplasm of rectosigmoid junction: Secondary | ICD-10-CM

## 2019-06-06 DIAGNOSIS — K635 Polyp of colon: Secondary | ICD-10-CM

## 2019-06-06 MED ORDER — SODIUM CHLORIDE 0.9 % IV SOLN: 500.0000 mL | Freq: Once | INTRAVENOUS | Status: DC

## 2019-06-06 NOTE — Op Note (Signed)
[facility] Patient Name: [name] Procedure Date: 06/06/2019 8:59 AM MRN: [id number] Endoscopist: Remo Lipps P. Havery Moros , MD Age: 58 Referring MD:  Date of Birth: Oct 10, 1961 Gender: Female Account #: [id number] Procedure:                Colonoscopy Indications:              Surveillance: Personal history of adenomatous                            polyps on last colonoscopy 3 years ago Medicines:                Monitored Anesthesia Care Procedure:                Pre-Anesthesia Assessment:                           - Prior to the procedure, a History and Physical                            was performed, and patient medications and                            allergies were reviewed. The patient's tolerance of                            previous anesthesia was also reviewed. The risks                            and benefits of the procedure and the sedation                            options and risks were discussed with the patient.                            All questions were answered, and informed consent                            was obtained. Prior Anticoagulants: The patient has                            taken no previous anticoagulant or antiplatelet                            agents. ASA Grade Assessment: II - A patient with                            mild systemic disease. After reviewing the risks                            and benefits, the patient was deemed in                            satisfactory condition to undergo the procedure.  After obtaining informed consent, the colonoscope                            was passed under direct vision. Throughout the                            procedure, the patient's blood pressure, pulse, and                            oxygen saturations were monitored continuously. The                            Model PCF-H190DL [id number]) scope was introduced                            through the anus  and advanced to the the cecum,                            identified by appendiceal orifice and ileocecal                            valve. The colonoscopy was performed without                            difficulty. The patient tolerated the procedure                            well. The quality of the bowel preparation was                            good. The ileocecal valve, appendiceal orifice, and                            rectum were photographed. Scope In: 9:15:36 AM Scope Out: 9:33:07 AM Scope Withdrawal Time: 0 hours 14 minutes 45 seconds  Total Procedure Duration: 0 hours 17 minutes 31 seconds  Findings:                 The perianal and digital rectal examinations were                            normal.                           A 2 mm polyp was found in the hepatic flexure. The                            polyp was sessile. The polyp was removed with a                            cold snare. Resection and retrieval were complete.                           A 5 mm polyp was found in the splenic flexure. The  polyp was sessile. The polyp was removed with a                            cold snare. Resection and retrieval were complete.                           A 4 mm polyp was found in the recto-sigmoid colon.                            The polyp was sessile. The polyp was removed with a                            cold snare. Resection and retrieval were complete.                           The exam was otherwise without abnormality on                            direct and retroflexion views. Complications:            No immediate complications. Estimated blood loss:                            Minimal. Estimated Blood Loss:     Estimated blood loss was minimal. Impression:               - One 2 mm polyp at the hepatic flexure, removed                            with a cold snare. Resected and retrieved.                           - One 5 mm polyp at the  splenic flexure, removed                            with a cold snare. Resected and retrieved.                           - One 4 mm polyp at the recto-sigmoid colon,                            removed with a cold snare. Resected and retrieved.                           - The examination was otherwise normal on direct                            and retroflexion views. Recommendation:           - Patient has a contact number available for                            emergencies. The signs and symptoms of potential  delayed complications were discussed with the                            patient. Return to normal activities tomorrow.                            Written discharge instructions were provided to the                            patient.                           - Resume previous diet.                           - Continue present medications.                           - Await pathology results. Remo Lipps P. Havery Moros, MD 06/06/2019 9:36:48 AM This report has been signed electronically.

## 2019-06-06 NOTE — Progress Notes (Signed)
Pt's states no medical or surgical changes since previsit or office visit. 

## 2019-06-06 NOTE — Patient Instructions (Signed)
Thank you for allowing us to participate in your care today!  Await pathology results by mail, approximately 2 weeks.  Recommendation for next colonoscopy will be made at that time.  Resume previous diet and medications today.  Return to your normal activities tomorrow.     YOU HAD AN ENDOSCOPIC PROCEDURE TODAY AT THE [facility]:   Refer to the procedure report that was given to you for any specific questions about what was found during the examination.  If the procedure report does not answer your questions, please call your gastroenterologist to clarify.  If you requested that your care partner not be given the details of your procedure findings, then the procedure report has been included in a sealed envelope for you to review at your convenience later.  YOU SHOULD EXPECT: Some feelings of bloating in the abdomen. Passage of more gas than usual.  Walking can help get rid of the air that was put into your GI tract during the procedure and reduce the bloating. If you had a lower endoscopy (such as a colonoscopy or flexible sigmoidoscopy) you may notice spotting of blood in your stool or on the toilet paper. If you underwent a bowel prep for your procedure, you may not have a normal bowel movement for a few days.  Please Note:  You might notice some irritation and congestion in your nose or some drainage.  This is from the oxygen used during your procedure.  There is no need for concern and it should clear up in a day or so.  SYMPTOMS TO REPORT IMMEDIATELY:   Following lower endoscopy (colonoscopy or flexible sigmoidoscopy):  Excessive amounts of blood in the stool  Significant tenderness or worsening of abdominal pains  Swelling of the abdomen that is new, acute  Fever of 100F or higher   For urgent or emergent issues, a gastroenterologist can be reached at any hour by calling [phone].   DIET:  We do recommend a small meal at first, but then you may proceed to your  regular diet.  Drink plenty of fluids but you should avoid alcoholic beverages for 24 hours.  ACTIVITY:  You should plan to take it easy for the rest of today and you should NOT DRIVE or use heavy machinery until tomorrow (because of the sedation medicines used during the test).    FOLLOW UP: Our staff will call the number listed on your records 48-72 hours following your procedure to check on you and address any questions or concerns that you may have regarding the information given to you following your procedure. If we do not reach you, we will leave a message.  We will attempt to reach you two times.  During this call, we will ask if you have developed any symptoms of COVID 19. If you develop any symptoms (ie: fever, flu-like symptoms, shortness of breath, cough etc.) before then, please call [phone].  If you test positive for Covid 19 in the 2 weeks post procedure, please call and report this information to us.    If any biopsies were taken you will be contacted by phone or by letter within the next 1-3 weeks.  Please call us at [phone] if you have not heard about the biopsies in 3 weeks.    SIGNATURES/CONFIDENTIALITY: You and/or your care partner have signed paperwork which will be entered into your electronic medical record.  These signatures attest to the fact that that the information above on your After Visit Summary   has been reviewed and is understood.  Full responsibility of the confidentiality of this discharge information lies with you and/or your care-partner. 

## 2019-06-06 NOTE — Progress Notes (Signed)
Called to room to assist during endoscopic procedure.  Patient ID and intended procedure confirmed with present staff. Received instructions for my participation in the procedure from the performing physician.  

## 2019-06-06 NOTE — Progress Notes (Signed)
Report to PACU, RN, vss, BBS= Clear.  

## 2019-06-10 ENCOUNTER — Telehealth: Payer: Self-pay | Admitting: *Deleted

## 2019-06-10 ENCOUNTER — Telehealth: Payer: Self-pay

## 2019-06-10 NOTE — Telephone Encounter (Signed)
  Follow up Call-  Call back number 06/06/2019  Post procedure Call Back phone  # [phone]  Permission to leave phone message Yes  Some recent data might be hidden     Patient questions:  Do you have a fever, pain , or abdominal swelling? No. Pain Score  0 *  Have you tolerated food without any problems? Yes.    Have you been able to return to your normal activities? Yes.    Do you have any questions about your discharge instructions: Diet   No. Medications  No. Follow up visit  No.  Do you have questions or concerns about your Care? No.  Actions: * If pain score is 4 or above: No action needed, pain <4.  1. Have you developed a fever since your procedure? NO  2.   Have you had an respiratory symptoms (SOB or cough) since your procedure? NO  3.   Have you tested positive for COVID 19 since your procedure NO  4.   Have you had any family members/close contacts diagnosed with the COVID 19 since your procedure?  NO   If yes to any of these questions please route to Joylene John, RN and Alphonsa Gin, RN.

## 2019-06-10 NOTE — Telephone Encounter (Signed)
Follow up call after pt's procedure, left message for pt to call if she is having any problems or pain, any fever or covid 19 sx or exposure.

## 2019-07-08 ENCOUNTER — Ambulatory Visit (INDEPENDENT_AMBULATORY_CARE_PROVIDER_SITE_OTHER): Payer: 59 | Admitting: *Deleted

## 2019-07-08 DIAGNOSIS — I442 Atrioventricular block, complete: Secondary | ICD-10-CM | POA: Diagnosis not present

## 2019-07-08 DIAGNOSIS — R55 Syncope and collapse: Secondary | ICD-10-CM

## 2019-07-09 LAB — CUP PACEART REMOTE DEVICE CHECK
Battery Remaining Longevity: 69 mo
Battery Remaining Percentage: 51 %
Battery Voltage: 2.87 V
Brady Statistic AP VP Percent: 1 %
Brady Statistic AP VS Percent: 1.6 %
Brady Statistic AS VP Percent: 1 %
Brady Statistic AS VS Percent: 98 %
Brady Statistic RA Percent Paced: 1.5 %
Brady Statistic RV Percent Paced: 1 %
Date Time Interrogation Session: 20200727074156
Implantable Lead Implant Date: 20111130
Implantable Lead Implant Date: 20111130
Implantable Lead Location: 753859
Implantable Lead Location: 753860
Implantable Pulse Generator Implant Date: 20111130
Lead Channel Impedance Value: 400 Ohm
Lead Channel Impedance Value: 450 Ohm
Lead Channel Pacing Threshold Amplitude: 0.875 V
Lead Channel Pacing Threshold Amplitude: 1.625 V
Lead Channel Pacing Threshold Pulse Width: 0.4 ms
Lead Channel Pacing Threshold Pulse Width: 0.4 ms
Lead Channel Sensing Intrinsic Amplitude: 4.1 mV
Lead Channel Sensing Intrinsic Amplitude: 5 mV
Lead Channel Setting Pacing Amplitude: 1.875
Lead Channel Setting Pacing Amplitude: 1.875
Lead Channel Setting Pacing Pulse Width: 0.4 ms
Lead Channel Setting Sensing Sensitivity: 2 mV
Pulse Gen Model: 2210
Pulse Gen Serial Number: 7192850

## 2019-07-11 ENCOUNTER — Other Ambulatory Visit: Payer: Self-pay | Admitting: Family Medicine

## 2019-07-23 NOTE — Progress Notes (Signed)
Remote pacemaker transmission.   

## 2019-08-01 ENCOUNTER — Ambulatory Visit (INDEPENDENT_AMBULATORY_CARE_PROVIDER_SITE_OTHER): Payer: 59 | Admitting: Otolaryngology

## 2019-09-18 ENCOUNTER — Encounter: Payer: Self-pay | Admitting: Family Medicine

## 2019-09-18 ENCOUNTER — Other Ambulatory Visit: Payer: Self-pay

## 2019-09-18 ENCOUNTER — Ambulatory Visit (INDEPENDENT_AMBULATORY_CARE_PROVIDER_SITE_OTHER): Payer: Managed Care, Other (non HMO) | Admitting: Family Medicine

## 2019-09-18 DIAGNOSIS — J019 Acute sinusitis, unspecified: Secondary | ICD-10-CM | POA: Diagnosis not present

## 2019-09-18 MED ORDER — ALBUTEROL SULFATE HFA 108 (90 BASE) MCG/ACT IN AERS: INHALATION_SPRAY | RESPIRATORY_TRACT | 0 refills | Status: DC

## 2019-09-18 MED ORDER — CEFDINIR 300 MG PO CAPS: ORAL_CAPSULE | ORAL | 0 refills | Status: DC

## 2019-09-18 MED ORDER — PREDNISONE 20 MG PO TABS: ORAL_TABLET | ORAL | 0 refills | Status: DC

## 2019-09-18 NOTE — Progress Notes (Signed)
   Subjective:  Audio   Patient ID: [name], female    DOB: [birth year], 58 y.o.   MRN: [id number]  Cough This is a new problem. Episode onset: 2-3 days. The cough is non-productive. Associated symptoms comments: Congestion, runny nose, headache, nausea. Treatments tried: allergy meds, vit d and zinc. The treatment provided mild relief.    Virtual Visit via Video Note  I connected with [name] on 09/18/19 at  2:00 PM EDT by a video enabled telemedicine application and verified that I am speaking with the correct person using two identifiers.  Location: Patient: home Provider: office   I discussed the limitations of evaluation and management by telemedicine and the availability of in person appointments. The patient expressed understanding and agreed to proceed.  History of Present Illness:    Observations/Objective:   Assessment and Plan:   Follow Up Instructions:    I discussed the assessment and treatment plan with the patient. The patient was provided an opportunity to ask questions and all were answered. The patient agreed with the plan and demonstrated an understanding of the instructions.   The patient was advised to call back or seek an in-person evaluation if the symptoms worsen or if the condition fails to improve as anticipated.   Patient notes nasal congestion.  Intermittent cough.  Cough wheezing in nature.  And tight.  Using inhaler several times per day     Review of Systems  Respiratory: Positive for cough.        Objective:   Physical Exam  Virtual      Assessment & Plan:  Impression sinusitis/bronchitis with exacerbation of reactive airways.  Antibiotics prescribed.  Prednisone taper given.  Albuterol as needed.  Patient has had no exposure to anyone sick.  She feels strongly not coronavirus discussed warning signs discussed

## 2019-09-27 ENCOUNTER — Telehealth: Payer: Self-pay | Admitting: Family Medicine

## 2019-09-27 MED ORDER — DOXYCYCLINE HYCLATE 100 MG PO TABS: 100.0000 mg | ORAL_TABLET | Freq: Two times a day (BID) | ORAL | 0 refills | Status: DC

## 2019-09-27 MED ORDER — DOXYCYCLINE HYCLATE 100 MG PO TABS: ORAL_TABLET | ORAL | 0 refills | Status: DC

## 2019-09-27 MED ORDER — HYDROCODONE-HOMATROPINE 5-1.5 MG/5ML PO SYRP: ORAL_SOLUTION | ORAL | 0 refills | Status: DC

## 2019-09-27 NOTE — Telephone Encounter (Signed)
No sob, no fever, having body aches, cough, congestion. About the same not worse. Tried antibiotic, prednisone and mucinex. Not sleeping because she is up coughing half the night.

## 2019-09-27 NOTE — Telephone Encounter (Signed)
Doxy 100 bid ten d, hycodan 3oz one tspn qhs prn cough, if worsens go to er

## 2019-09-27 NOTE — Addendum Note (Signed)
Addended by: Carmelina Noun on: 09/27/2019 04:26 PM   Modules accepted: Orders

## 2019-09-27 NOTE — Telephone Encounter (Signed)
meds were sent to pharm. Pt notified on voicemail.

## 2019-09-27 NOTE — Telephone Encounter (Signed)
Pt finished the antibiotic & prednisone, still very congested, cough  Using OTC's with minimal relief  Please advise     [facility]

## 2019-09-30 ENCOUNTER — Other Ambulatory Visit: Payer: Self-pay | Admitting: Family Medicine

## 2019-10-01 NOTE — Telephone Encounter (Signed)
Call pt needs yrly bw for thyr and rest tsh lip liv m7 ref times one pending

## 2019-10-04 ENCOUNTER — Telehealth: Payer: Self-pay | Admitting: Family Medicine

## 2019-10-04 DIAGNOSIS — E559 Vitamin D deficiency, unspecified: Secondary | ICD-10-CM

## 2019-10-04 DIAGNOSIS — E039 Hypothyroidism, unspecified: Secondary | ICD-10-CM

## 2019-10-04 DIAGNOSIS — Z79899 Other long term (current) drug therapy: Secondary | ICD-10-CM

## 2019-10-04 DIAGNOSIS — E785 Hyperlipidemia, unspecified: Secondary | ICD-10-CM

## 2019-10-04 NOTE — Telephone Encounter (Signed)
Last labs 06/12/18 lipid, liver, tsh, bmp, vit d

## 2019-10-04 NOTE — Telephone Encounter (Signed)
Patient has appointment in November and needing labs done

## 2019-10-04 NOTE — Telephone Encounter (Signed)
Patient has appointment 10/30

## 2019-10-06 NOTE — Telephone Encounter (Signed)
same

## 2019-10-07 NOTE — Telephone Encounter (Signed)
Blood work ordered in Epic. Patient notified. 

## 2019-10-08 ENCOUNTER — Ambulatory Visit (INDEPENDENT_AMBULATORY_CARE_PROVIDER_SITE_OTHER): Payer: 59 | Admitting: *Deleted

## 2019-10-08 DIAGNOSIS — I442 Atrioventricular block, complete: Secondary | ICD-10-CM | POA: Diagnosis not present

## 2019-10-08 DIAGNOSIS — R55 Syncope and collapse: Secondary | ICD-10-CM

## 2019-10-08 LAB — BASIC METABOLIC PANEL
BUN/Creatinine Ratio: 9 (ref 9–23)
BUN: 8 mg/dL (ref 6–24)
CO2: 23 mmol/L (ref 20–29)
Calcium: 9.6 mg/dL (ref 8.7–10.2)
Chloride: 103 mmol/L (ref 96–106)
Creatinine, Ser: 0.89 mg/dL (ref 0.57–1.00)
GFR calc Af Amer: 84 mL/min/{1.73_m2} (ref >59)
GFR calc non Af Amer: 73 mL/min/{1.73_m2} (ref >59)
Glucose: 117 mg/dL — ABNORMAL HIGH (ref 65–99)
Potassium: 4.1 mmol/L (ref 3.5–5.2)
Sodium: 140 mmol/L (ref 134–144)

## 2019-10-08 LAB — LIPID PANEL
Chol/HDL Ratio: 4.2 ratio (ref 0.0–4.4)
Cholesterol, Total: 189 mg/dL (ref 100–199)
HDL: 45 mg/dL (ref >39)
LDL Chol Calc (NIH): 103 mg/dL — ABNORMAL HIGH (ref 0–99)
Triglycerides: 239 mg/dL — ABNORMAL HIGH (ref 0–149)
VLDL Cholesterol Cal: 41 mg/dL — ABNORMAL HIGH (ref 5–40)

## 2019-10-08 LAB — CUP PACEART REMOTE DEVICE CHECK
Battery Remaining Longevity: 69 mo
Battery Remaining Percentage: 51 %
Battery Voltage: 2.87 V
Brady Statistic AP VP Percent: 1 %
Brady Statistic AP VS Percent: 2.4 %
Brady Statistic AS VP Percent: 1 %
Brady Statistic AS VS Percent: 97 %
Brady Statistic RA Percent Paced: 2.1 %
Brady Statistic RV Percent Paced: 1 %
Date Time Interrogation Session: 20201026061052
Implantable Lead Implant Date: 20111130
Implantable Lead Implant Date: 20111130
Implantable Lead Location: 753859
Implantable Lead Location: 753860
Implantable Pulse Generator Implant Date: 20111130
Lead Channel Impedance Value: 380 Ohm
Lead Channel Impedance Value: 440 Ohm
Lead Channel Pacing Threshold Amplitude: 0.75 V
Lead Channel Pacing Threshold Amplitude: 1.5 V
Lead Channel Pacing Threshold Pulse Width: 0.4 ms
Lead Channel Pacing Threshold Pulse Width: 0.4 ms
Lead Channel Sensing Intrinsic Amplitude: 2.9 mV
Lead Channel Sensing Intrinsic Amplitude: 4.6 mV
Lead Channel Setting Pacing Amplitude: 1.75 V
Lead Channel Setting Pacing Amplitude: 1.75 V
Lead Channel Setting Pacing Pulse Width: 0.4 ms
Lead Channel Setting Sensing Sensitivity: 2 mV
Pulse Gen Model: 2210
Pulse Gen Serial Number: 7192850

## 2019-10-08 LAB — HEPATIC FUNCTION PANEL
ALT: 21 IU/L (ref 0–32)
AST: 18 IU/L (ref 0–40)
Albumin: 4.2 g/dL (ref 3.8–4.9)
Alkaline Phosphatase: 92 IU/L (ref 39–117)
Bilirubin Total: 0.2 mg/dL (ref 0.0–1.2)
Bilirubin, Direct: 0.09 mg/dL (ref 0.00–0.40)
Total Protein: 6.7 g/dL (ref 6.0–8.5)

## 2019-10-08 LAB — VITAMIN D 25 HYDROXY (VIT D DEFICIENCY, FRACTURES): Vit D, 25-Hydroxy: 23.1 ng/mL — ABNORMAL LOW (ref 30.0–100.0)

## 2019-10-08 LAB — TSH: TSH: 5.24 u[IU]/mL — ABNORMAL HIGH (ref 0.450–4.500)

## 2019-10-11 ENCOUNTER — Ambulatory Visit (INDEPENDENT_AMBULATORY_CARE_PROVIDER_SITE_OTHER): Payer: Managed Care, Other (non HMO) | Admitting: Family Medicine

## 2019-10-11 DIAGNOSIS — E039 Hypothyroidism, unspecified: Secondary | ICD-10-CM | POA: Diagnosis not present

## 2019-10-11 DIAGNOSIS — E785 Hyperlipidemia, unspecified: Secondary | ICD-10-CM

## 2019-10-11 MED ORDER — ROSUVASTATIN CALCIUM 10 MG PO TABS: ORAL_TABLET | ORAL | 1 refills | Status: DC

## 2019-10-11 MED ORDER — LEVOTHYROXINE SODIUM 88 MCG PO TABS: ORAL_TABLET | ORAL | 3 refills | Status: DC

## 2019-10-11 MED ORDER — ALPRAZOLAM 1 MG PO TABS: ORAL_TABLET | ORAL | 5 refills | Status: DC

## 2019-10-11 MED ORDER — SERTRALINE HCL 100 MG PO TABS: 150.0000 mg | ORAL_TABLET | Freq: Every day | ORAL | 1 refills | Status: DC

## 2019-10-11 MED ORDER — ALBUTEROL SULFATE HFA 108 (90 BASE) MCG/ACT IN AERS: INHALATION_SPRAY | RESPIRATORY_TRACT | 5 refills | Status: DC

## 2019-10-11 MED ORDER — OMEPRAZOLE 20 MG PO CPDR: 20.0000 mg | DELAYED_RELEASE_CAPSULE | Freq: Two times a day (BID) | ORAL | 5 refills | Status: AC

## 2019-10-11 NOTE — Progress Notes (Signed)
Subjective:    Patient ID: [name], female    DOB: 12-22-1960, 58 y.o.   MRN: [id number]  HPI Pt states she is doing well. No issues or concerns. Pt is still taking Hycodan cough syrup for cough. Takes Synthroid 75 mcg daily.  Virtual Visit via Telephone Note  I connected with Lorin Mercy on 10/11/19 at  1:10 PM EDT by telephone and verified that I am speaking with the correct person using two identifiers.  Location: Patient: home Provider: office   I discussed the limitations, risks, security and privacy concerns of performing an evaluation and management service by telephone and the availability of in person appointments. I also discussed with the patient that there may be a patient responsible charge related to this service. The patient expressed understanding and agreed to proceed.   History of Present Illness:    Observations/Objective:   Assessment and Plan:   Follow Up Instructions:    I discussed the assessment and treatment plan with the patient. The patient was provided an opportunity to ask questions and all were answered. The patient agreed with the plan and demonstrated an understanding of the instructions.   The patient was advised to call back or seek an in-person evaluation if the symptoms worsen or if the condition fails to improve as anticipated.  I provided Review 20 minutes of non-face-to-face time during this encounter. Results for orders placed or performed in visit on 10/08/19  CUP PACEART REMOTE DEVICE CHECK  Result Value Ref Range   Date Time Interrogation Session [id number]    Pulse Generator Manufacturer [employer]    Pulse Gen Model 2210 [address] DR RF    Pulse Gen Serial Number [id number]    [facility]inic Name [facility]    Implantable Pulse Generator Type Implantable Pulse Generator    Implantable Pulse Generator Implant Date [id number]    Implantable Lead Manufacturer [facility]    Implantable Lead Model [phone] Tendril STS    Implantable Lead  Serial Number [id number]    Implantable Lead Implant Date [id number]    Implantable Lead Location [id number]    Implantable Lead Manufacturer [facility]    Implantable Lead Model [id number] Tendril STS    Implantable Lead Serial Number [id number]    Implantable Lead Implant Date [id number]    Implantable Lead Location [id number]    Lead Channel Setting Sensing Sensitivity 2.0 mV   Lead Channel Setting Sensing Adaptation Mode Fixed Pacing    Lead Channel Setting Pacing Amplitude 1.75 V   Lead Channel Setting Pacing Pulse Width 0.4 ms   Lead Channel Setting Pacing Amplitude 1.75 V   Lead Channel Status     Lead Channel Impedance Value 380 ohm   Lead Channel Sensing Intrinsic Amplitude 2.9 mV   Lead Channel Pacing Threshold Amplitude 0.75 V   Lead Channel Pacing Threshold Pulse Width 0.4 ms   Lead Channel Status     Lead Channel Impedance Value 440 ohm   Lead Channel Sensing Intrinsic Amplitude 4.6 mV   Lead Channel Pacing Threshold Amplitude 1.5 V   Lead Channel Pacing Threshold Pulse Width 0.4 ms   Battery Status MOS    Battery Remaining Longevity 69 mo   Battery Remaining Percentage 51.0 %   Battery Voltage 2.87 V   Brady Statistic RA Percent Paced 2.1 %   Brady Statistic RV Percent Paced 1.0 %   Brady Statistic AP VP Percent 1.0 %   Brady Statistic AS VP Percent 1.0 %   [employer] AP  VS Percent 2.4 %   Brady Statistic AS VS Percent 97.0 %   Patient continues to take lipid medication regularly. No obvious side effects from it. Generally does not miss a dose. Prior blood work results are reviewed with patient. Patient continues to work on fat intake in diet  Thyr patient claims compliance with thyroid medicine.  Generally does not miss a dose.  Plan  prediab.  Realizes she has an issue with her sugar.  Has been trying to cut sugars down in her diet  Vicente Males, LPN   Review of Systems No headache no chest pain    Objective:   Physical Exam   Virtual     Assessment & Plan:    Impression 1 hypothyroidism control suboptimal.  Discussed TSH elevated.  Will adjust levothyroid dosage  2.  Hyperlipidemia good control discussed maintain same meds  Diet exercise discussed follow-up in 6 months medications refilled

## 2019-10-13 ENCOUNTER — Encounter: Payer: Self-pay | Admitting: Family Medicine

## 2019-10-24 ENCOUNTER — Other Ambulatory Visit: Payer: Self-pay | Admitting: *Deleted

## 2019-10-24 DIAGNOSIS — R918 Other nonspecific abnormal finding of lung field: Secondary | ICD-10-CM

## 2019-10-28 NOTE — Progress Notes (Signed)
Remote pacemaker transmission.   

## 2019-11-04 ENCOUNTER — Encounter: Payer: Self-pay | Admitting: Family Medicine

## 2019-11-04 ENCOUNTER — Other Ambulatory Visit: Payer: Self-pay

## 2019-11-04 ENCOUNTER — Ambulatory Visit (INDEPENDENT_AMBULATORY_CARE_PROVIDER_SITE_OTHER): Payer: Managed Care, Other (non HMO) | Admitting: Family Medicine

## 2019-11-04 DIAGNOSIS — Z20828 Contact with and (suspected) exposure to other viral communicable diseases: Secondary | ICD-10-CM

## 2019-11-04 DIAGNOSIS — Z20822 Contact with and (suspected) exposure to covid-19: Secondary | ICD-10-CM

## 2019-11-04 NOTE — Progress Notes (Signed)
   Subjective:  Audio  Patient ID: [name], female    DOB: Jul 03, 1961, 58 y.o.   MRN: [id number]  Sinusitis This is a new problem. Episode onset: 2 -3 times a day. There has been no fever. Associated symptoms include coughing.   Diarrhea started yesterday.   Husband is in hospital now with covid/pneumonia.   Insurance will not cover incruse starting in January. Pt states she needs to be switched spirivia.  Virtual Visit via Video Note  I connected with [name] on 11/04/19 at  2:00 PM EST by a video enabled telemedicine application and verified that I am speaking with the correct person using two identifiers.  Location: Patient: home Provider: office   I discussed the limitations of evaluation and management by telemedicine and the availability of in person appointments. The patient expressed understanding and agreed to proceed.  History of Present Illness:    Observations/Objective:   Assessment and Plan:   Follow Up Instructions:    I discussed the assessment and treatment plan with the patient. The patient was provided an opportunity to ask questions and all were answered. The patient agreed with the plan and demonstrated an understanding of the instructions.   The patient was advised to call back or seek an in-person evaluation if the symptoms worsen or if the condition fails to improve as anticipated.  I provided 18 minutes of non-face-to-face time during this encounter.     Review of Systems  Respiratory: Positive for cough.        Objective:   Physical Exam  Virtual     Assessment & Plan:  Impression probable COVID-19.  Discussed at length.  Imodium.  Diarrhea.  Albuterol.  Wheezes.  Warning signs discussed carefully.  No past hospital with COVID-19

## 2019-11-08 ENCOUNTER — Telehealth: Payer: Self-pay | Admitting: Family Medicine

## 2019-11-08 NOTE — Telephone Encounter (Signed)
Pt contacted to see if she had COVID testing done. Pt had COVID testing done at [facility]. Pt has not received results yet. Informed patient to send [place] an update when she get results.

## 2019-11-11 ENCOUNTER — Telehealth: Payer: Self-pay | Admitting: Family Medicine

## 2019-11-11 NOTE — Telephone Encounter (Signed)
Pt calling to report COVID test was negative.

## 2019-11-11 NOTE — Telephone Encounter (Signed)
Pt called and states covid test was negative

## 2019-11-26 ENCOUNTER — Ambulatory Visit (INDEPENDENT_AMBULATORY_CARE_PROVIDER_SITE_OTHER): Payer: 59 | Admitting: Internal Medicine

## 2019-11-26 ENCOUNTER — Encounter: Payer: Self-pay | Admitting: Internal Medicine

## 2019-11-26 VITALS — BP 132/78 | HR 67 | Temp 97.5°F | Ht 65.0 in | Wt 222.0 lb

## 2019-11-26 DIAGNOSIS — Z950 Presence of cardiac pacemaker: Secondary | ICD-10-CM

## 2019-11-26 DIAGNOSIS — I495 Sick sinus syndrome: Secondary | ICD-10-CM

## 2019-11-26 DIAGNOSIS — R55 Syncope and collapse: Secondary | ICD-10-CM

## 2019-11-26 NOTE — Patient Instructions (Signed)
Medication Instructions:  Your physician recommends that you continue on your current medications as directed. Please refer to the Current Medication list given to you today.  *If you need a refill on your cardiac medications before your next appointment, please call your pharmacy*  Lab Work: NONE  If you have labs (blood work) drawn today and your tests are completely normal, you will receive your results only by: . MyChart Message (if you have MyChart) OR . A paper copy in the mail If you have any lab test that is abnormal or we need to change your treatment, we will call you to review the results.  Testing/Procedures: NONE   Follow-Up: At [facility], you and your health needs are our priority.  As part of our continuing mission to provide you with exceptional heart care, we have created designated Provider Care Teams.  These Care Teams include your primary Cardiologist (physician) and Advanced Practice Providers (APPs -  Physician Assistants and Nurse Practitioners) who all work together to provide you with the care you need, when you need it.  Your next appointment:   1 year(s)  The format for your next appointment:   In Person  Provider:   Gregg Taylor, MD  Other Instructions Thank you for choosing [facility]!    

## 2019-11-26 NOTE — Progress Notes (Signed)
HPI [name] returns today for followup. She has a h/o sinus node dysfunction, s/p PPM insertion. She denies chest pain or sob. She has had occaisional episodes where she feels like she has been awakened with palpitations. Allergies  Allergen Reactions  . Codeine   . Sulfonamide Derivatives   . Betadine [Povidone Iodine] Other (See Comments)    Blisters   . Paxil [Paroxetine Hcl] Other (See Comments)    Extreme sweating  . Vioxx [Rofecoxib] Rash     Current Outpatient Medications  Medication Sig Dispense Refill  . albuterol (VENTOLIN HFA) 108 (90 Base) MCG/ACT inhaler INHALE 2 PUFFS BY MOUTH EVERY 4 HOURS AS NEEDED FOR WHEEZING OR SHORTNESS OF BREATH 18 g 5  . ALPRAZolam (XANAX) 1 MG tablet TAKE 1/2 (ONE-HALF) TABLET BY MOUTH TWICE DAILY AS NEEDED FOR ANXIETY OR  SLEEP 30 tablet 5  . EUTHYROX 75 MCG tablet TAKE 1 TABLET BY MOUTH ONCE DAILY (NEEDS  OFFICE  VISIT) 30 tablet 0  . fluticasone (FLONASE) 50 MCG/ACT nasal spray Place 1 spray into both nostrils daily. 16 g 5  . HYDROcodone-homatropine (HYCODAN) 5-1.5 MG/5ML syrup Take one tspn po qhs prn cough 90 mL 0  . levothyroxine (SYNTHROID) 88 MCG tablet Take one tablet po daily 90 tablet 3  . omeprazole (PRILOSEC) 20 MG capsule Take 1 capsule (20 mg total) by mouth 2 (two) times daily. 60 capsule 5  . rosuvastatin (CRESTOR) 10 MG tablet TAKE 1 TABLET BY MOUTH ONCE DAILY FOR CHOLESTEROL 90 tablet 1  . sertraline (ZOLOFT) 100 MG tablet Take 1.5 tablets (150 mg total) by mouth daily. 135 tablet 1  . umeclidinium bromide (INCRUSE ELLIPTA) 62.5 MCG/INH AEPB Inhale 1 puff into the lungs daily. 30 each 3  . Vitamin D, Ergocalciferol, (DRISDOL) 50000 units CAPS capsule Take 1 capsule (50,000 Units total) by mouth every 7 (seven) days. 4 capsule 2   No current facility-administered medications for this visit.     Past Medical History:  Diagnosis Date  . Allergy   . Anxiety   . Arthritis   . Asthma   . Cataract   . Depression     bipolar disorder  . Esophageal stricture   . GERD (gastroesophageal reflux disease)   . Heart defect   . HLD (hyperlipidemia)   . Hypothyroidism   . IBS (irritable bowel syndrome)   . IFG (impaired fasting glucose)   . Migraine headache   . OSA (obstructive sleep apnea)   . Reflux   . Sleep apnea   . Symptomatic bradycardia     ROS:   All systems reviewed and negative except as noted in the HPI.   Past Surgical History:  Procedure Laterality Date  . ABDOMINAL HERNIA REPAIR    . BREAST CYST EXCISION     left  . Cesarean sections     x2  . CHOLECYSTECTOMY    . COLONOSCOPY    . dental implants    . foor surgery Right    screws   . FOOT SURGERY     screws in left foot  . PACEMAKER INSERTION    . TUBAL LIGATION    . VAGINAL HYSTERECTOMY       Family History  Problem Relation Age of Onset  . Throat cancer Father        squamous cell carcinoma  . Tongue cancer Father   . Esophageal cancer Father   . Arthritis Mother   . Heart disease Mother  coronary; female < 53  . Asthma Mother   . Colon cancer Neg Hx   . Rectal cancer Neg Hx   . Stomach cancer Neg Hx      Social History   Socioeconomic History  . Marital status: Married    Spouse name: Not on file  . Number of children: 2  . Years of education: Not on file  . Highest education level: Not on file  Occupational History  . Occupation: retired    Fish farm manager: [employer]  Tobacco Use  . Smoking status: Current Every Day Smoker    Packs/day: 2.00    Years: 25.00    Pack years: 50.00    Types: Cigarettes  . Smokeless tobacco: Never Used  . Tobacco comment: tobacco info givin 03/08/16, smokes half pack per day 02.27.20  Substance and Sexual Activity  . Alcohol use: No    Alcohol/week: 0.0 standard drinks    Comment: Occ.   . Drug use: No  . Sexual activity: Not on file  Other Topics Concern  . Not on file  Social History Narrative   Unemployed. 10 cans of soda per day.    Social  Determinants of Health   Financial Resource Strain:   . Difficulty of Paying Living Expenses: Not on file  Food Insecurity:   . Worried About Charity fundraiser in the Last Year: Not on file  . Ran Out of Food in the Last Year: Not on file  Transportation Needs:   . Lack of Transportation (Medical): Not on file  . Lack of Transportation (Non-Medical): Not on file  Physical Activity:   . Days of Exercise per Week: Not on file  . Minutes of Exercise per Session: Not on file  Stress:   . Feeling of Stress : Not on file  Social Connections:   . Frequency of Communication with Friends and Family: Not on file  . Frequency of Social Gatherings with Friends and Family: Not on file  . Attends Religious Services: Not on file  . Active Member of Clubs or Organizations: Not on file  . Attends Archivist Meetings: Not on file  . Marital Status: Not on file  Intimate Partner Violence:   . Fear of Current or Ex-Partner: Not on file  . Emotionally Abused: Not on file  . Physically Abused: Not on file  . Sexually Abused: Not on file     BP 132/78   Pulse 67   Temp (!) 97.5 F (36.4 C)   Ht 5\' 5"  (1.651 m)   Wt 222 lb (100.7 kg)   SpO2 97%   BMI 36.94 kg/m   Physical Exam:  Well appearing NAD HEENT: Unremarkable Neck:  No JVD, no thyromegally Lymphatics:  No adenopathy Back:  No CVA tenderness Lungs:  Clear with no wheezes HEART:  Regular rate rhythm, no murmurs, no rubs, no clicks Abd:  soft, positive bowel sounds, no organomegally, no rebound, no guarding Ext:  2 plus pulses, no edema, no cyanosis, no clubbing Skin:  No rashes no nodules Neuro:  CN II through XII intact, motor grossly intact   DEVICE  Normal device function.  See PaceArt for details.   Assess/Plan: 1. Obesity - I discussed the importance of weight loss. I asked her to lose 20 lbs. 2. HTN - her bp is reasonably well controlled. She will continue her current meds. 3. PAF - she has been out of  rhythm 2 minutes. No indication for anti-coagulation at this point.  [name].D.

## 2019-12-31 ENCOUNTER — Telehealth: Payer: Self-pay | Admitting: Family Medicine

## 2019-12-31 NOTE — Telephone Encounter (Signed)
Ok plus 6 ref 

## 2019-12-31 NOTE — Telephone Encounter (Signed)
Patient is requesting a prescription for Spiriva inhaler due to her insurance changed January first and this is what they pay for.[facility] [place]

## 2019-12-31 NOTE — Telephone Encounter (Signed)
Strength and directions please- new script

## 2020-01-03 ENCOUNTER — Telehealth: Payer: Self-pay | Admitting: *Deleted

## 2020-01-03 NOTE — Telephone Encounter (Signed)
Repeat CT from [facility] under encounters 11/15/2019

## 2020-01-05 NOTE — Telephone Encounter (Signed)
Ok, advise pt to repeat scan in one yr and place on call back list. Autumn this week fill me in on status of call back list and its functionality and how we are doing keeping up with it

## 2020-01-07 ENCOUNTER — Telehealth: Payer: Self-pay | Admitting: Family Medicine

## 2020-01-07 ENCOUNTER — Ambulatory Visit (INDEPENDENT_AMBULATORY_CARE_PROVIDER_SITE_OTHER): Payer: 59 | Admitting: *Deleted

## 2020-01-07 DIAGNOSIS — I442 Atrioventricular block, complete: Secondary | ICD-10-CM

## 2020-01-07 LAB — CUP PACEART REMOTE DEVICE CHECK
Battery Remaining Longevity: 62 mo
Battery Remaining Percentage: 46 %
Battery Voltage: 2.86 V
Brady Statistic AP VP Percent: 1 %
Brady Statistic AP VS Percent: 3.4 %
Brady Statistic AS VP Percent: 1 %
Brady Statistic AS VS Percent: 96 %
Brady Statistic RA Percent Paced: 3 %
Brady Statistic RV Percent Paced: 1 %
Date Time Interrogation Session: 20210126034100
Implantable Lead Implant Date: 20111130
Implantable Lead Implant Date: 20111130
Implantable Lead Location: 753859
Implantable Lead Location: 753860
Implantable Pulse Generator Implant Date: 20111130
Lead Channel Impedance Value: 380 Ohm
Lead Channel Impedance Value: 430 Ohm
Lead Channel Pacing Threshold Amplitude: 0.75 V
Lead Channel Pacing Threshold Amplitude: 1.625 V
Lead Channel Pacing Threshold Pulse Width: 0.4 ms
Lead Channel Pacing Threshold Pulse Width: 0.4 ms
Lead Channel Sensing Intrinsic Amplitude: 4.3 mV
Lead Channel Sensing Intrinsic Amplitude: 4.3 mV
Lead Channel Setting Pacing Amplitude: 1.75 V
Lead Channel Setting Pacing Amplitude: 1.875
Lead Channel Setting Pacing Pulse Width: 0.4 ms
Lead Channel Setting Sensing Sensitivity: 2 mV
Pulse Gen Model: 2210
Pulse Gen Serial Number: 7192850

## 2020-01-07 MED ORDER — SPIRIVA HANDIHALER 18 MCG IN CAPS: ORAL_CAPSULE | RESPIRATORY_TRACT | 11 refills | Status: DC

## 2020-01-07 NOTE — Telephone Encounter (Signed)
Medication sent to pharmacy and pt is aware 

## 2020-01-07 NOTE — Telephone Encounter (Signed)
Pt checking on status of Spiriva inhaler being sent to [facility] [place], [place] [address]   See note from 12/31/2019

## 2020-01-07 NOTE — Telephone Encounter (Signed)
2 puffs qam 11 ref

## 2020-01-07 NOTE — Telephone Encounter (Signed)
Upon looking in chart looks to be a new script. Please advise. Thank you

## 2020-01-07 NOTE — Telephone Encounter (Signed)
Pt contacted and verbalized understanding. Reminder placed in reminder file

## 2020-01-15 ENCOUNTER — Encounter: Payer: Self-pay | Admitting: Family Medicine

## 2020-01-28 ENCOUNTER — Encounter: Payer: Self-pay | Admitting: Family Medicine

## 2020-01-28 ENCOUNTER — Ambulatory Visit (INDEPENDENT_AMBULATORY_CARE_PROVIDER_SITE_OTHER): Payer: 59 | Admitting: Family Medicine

## 2020-01-28 VITALS — BP 127/81 | Temp 98.4°F | Ht 65.0 in | Wt 220.0 lb

## 2020-01-28 DIAGNOSIS — R05 Cough: Secondary | ICD-10-CM | POA: Diagnosis not present

## 2020-01-28 DIAGNOSIS — R053 Chronic cough: Secondary | ICD-10-CM

## 2020-01-28 DIAGNOSIS — J019 Acute sinusitis, unspecified: Secondary | ICD-10-CM

## 2020-01-28 MED ORDER — PREDNISONE 20 MG PO TABS: ORAL_TABLET | ORAL | 0 refills | Status: DC

## 2020-01-28 MED ORDER — ADVAIR HFA 115-21 MCG/ACT IN AERO: 2.0000 | INHALATION_SPRAY | Freq: Two times a day (BID) | RESPIRATORY_TRACT | 5 refills | Status: DC

## 2020-01-28 MED ORDER — CEFDINIR 300 MG PO CAPS: 300.0000 mg | ORAL_CAPSULE | Freq: Two times a day (BID) | ORAL | 0 refills | Status: DC

## 2020-01-28 NOTE — Progress Notes (Signed)
   Subjective:  Audio  Patient ID: [name], female    DOB: 09-20-1961, 59 y.o.   MRN: [id number]  Cough This is a new problem. Episode onset: one week. Associated symptoms comments: Congestion, hoarseness. Treatments tried: otc allergy med.   Pt states cough started after starting spiriva.   Virtual Visit via Telephone Note  I connected with ELY BONIFAS on 01/28/20 at  9:30 AM EST by telephone and verified that I am speaking with the correct person using two identifiers.  Location: Patient: home Provider: office   I discussed the limitations, risks, security and privacy concerns of performing an evaluation and management service by telephone and the availability of in person appointments. I also discussed with the patient that there may be a patient responsible charge related to this service. The patient expressed understanding and agreed to proceed.   History of Present Illness:    Observations/Objective:   Assessment and Plan:   Follow Up Instructions:    I discussed the assessment and treatment plan with the patient. The patient was provided an opportunity to ask questions and all were answered. The patient agreed with the plan and demonstrated an understanding of the instructions.   The patient was advised to call back or seek an in-person evaluation if the symptoms worsen or if the condition fails to improve as anticipated.  I provided 20 minutes of non-face-to-face time during this encounter.  Cong in the chesty  Pt was on incrys   pul put pt on incrys  Then insur did not cover  Still smokimg  Never was    Review of Systems  Respiratory: Positive for cough.        Objective:   Physical Exam  Virtual      Assessment & Plan:  Impression 1 subacute rhinosinusitis/flare of probable COPD.  Patient was started on Spiriva-like agent.  Insurance not covering.  Has not seen her pulmonary doctor back yet to finish her work-up for potential  COPD.  Spiriva is causing cough.  Will stop Spiriva and initiate Advair.  Antibiotics prescribed for exacerbation warning signs discussed.  Patient would prefer not to do Covid testing since he symptoms have developed over weeks at his recent

## 2020-02-14 IMAGING — DX DG CHEST 2V
2 series · 2 of 2 positions shown · non-contrast
Comparison: CT chest, abdomen and pelvis 11/01/2011. PA and lateral
chest 08/02/2017.

CLINICAL DATA: Productive cough and shortness of breath for 1
month.

EXAM:
CHEST - 2 VIEW

[chest pa]
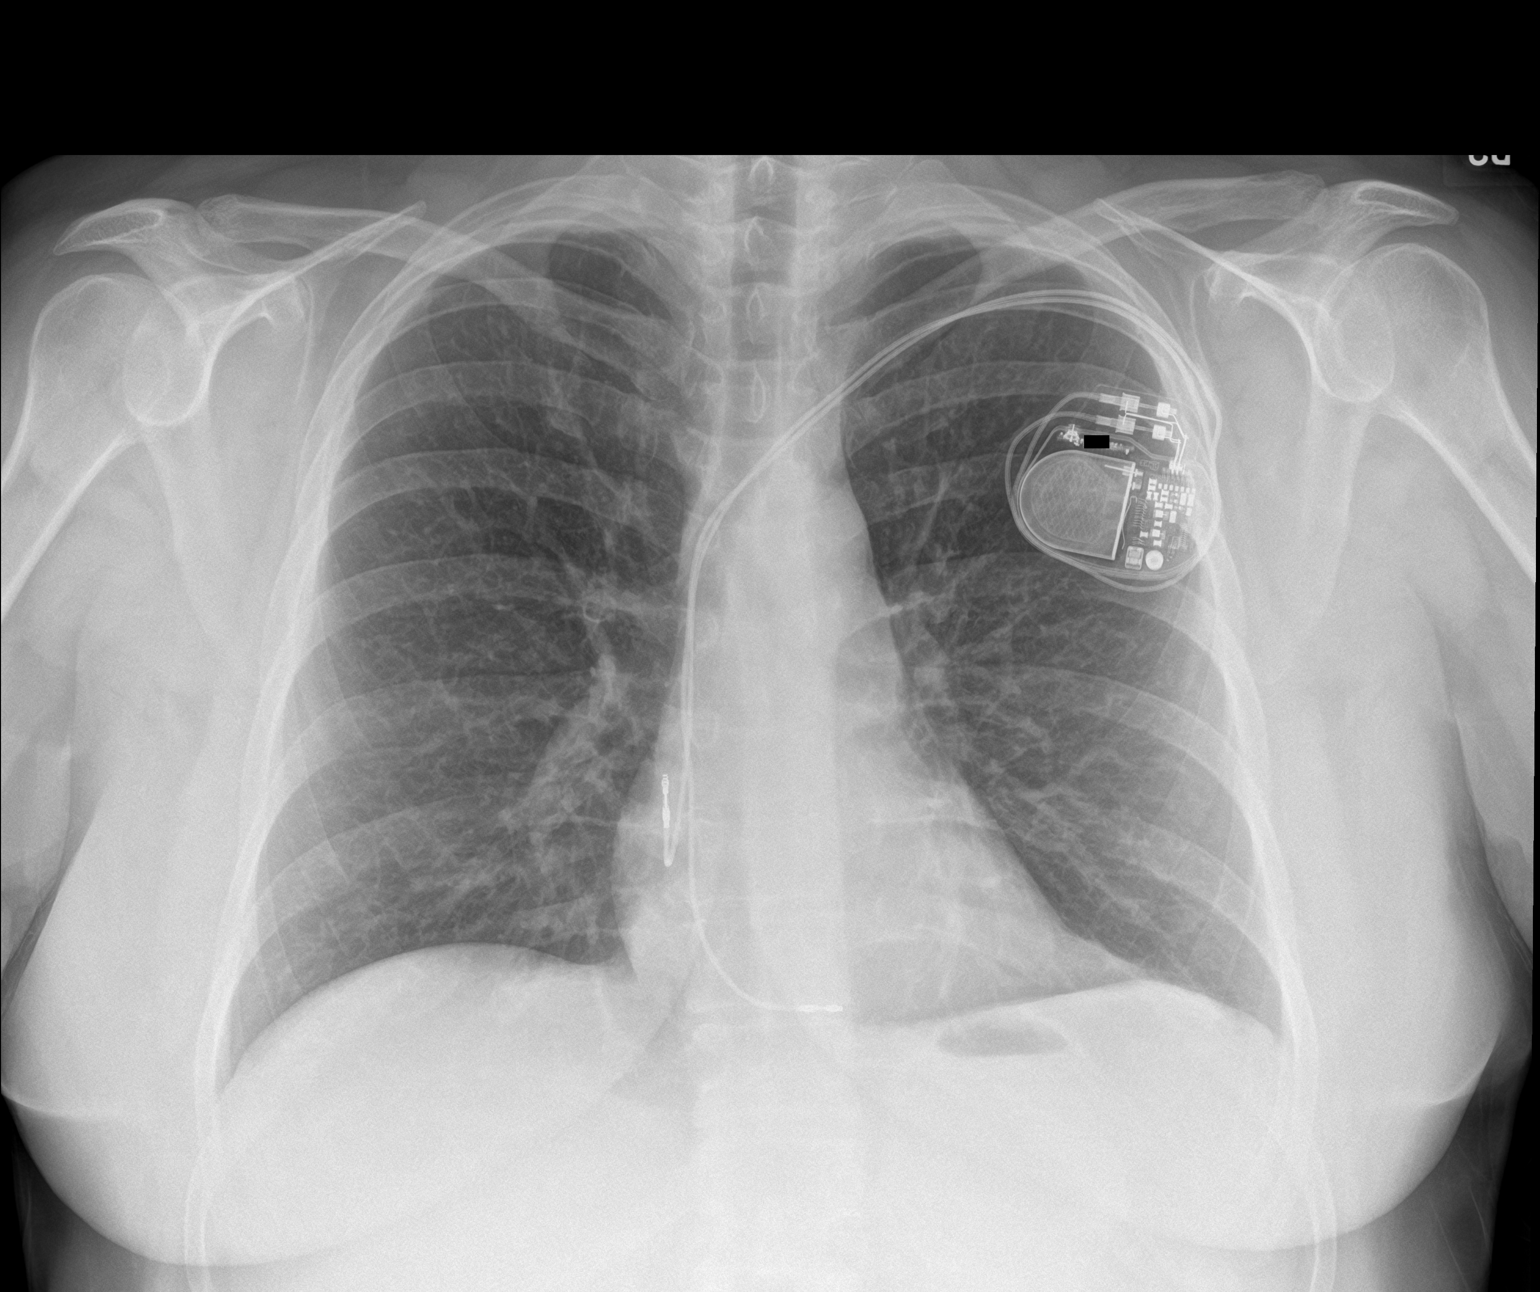

[chest lat]
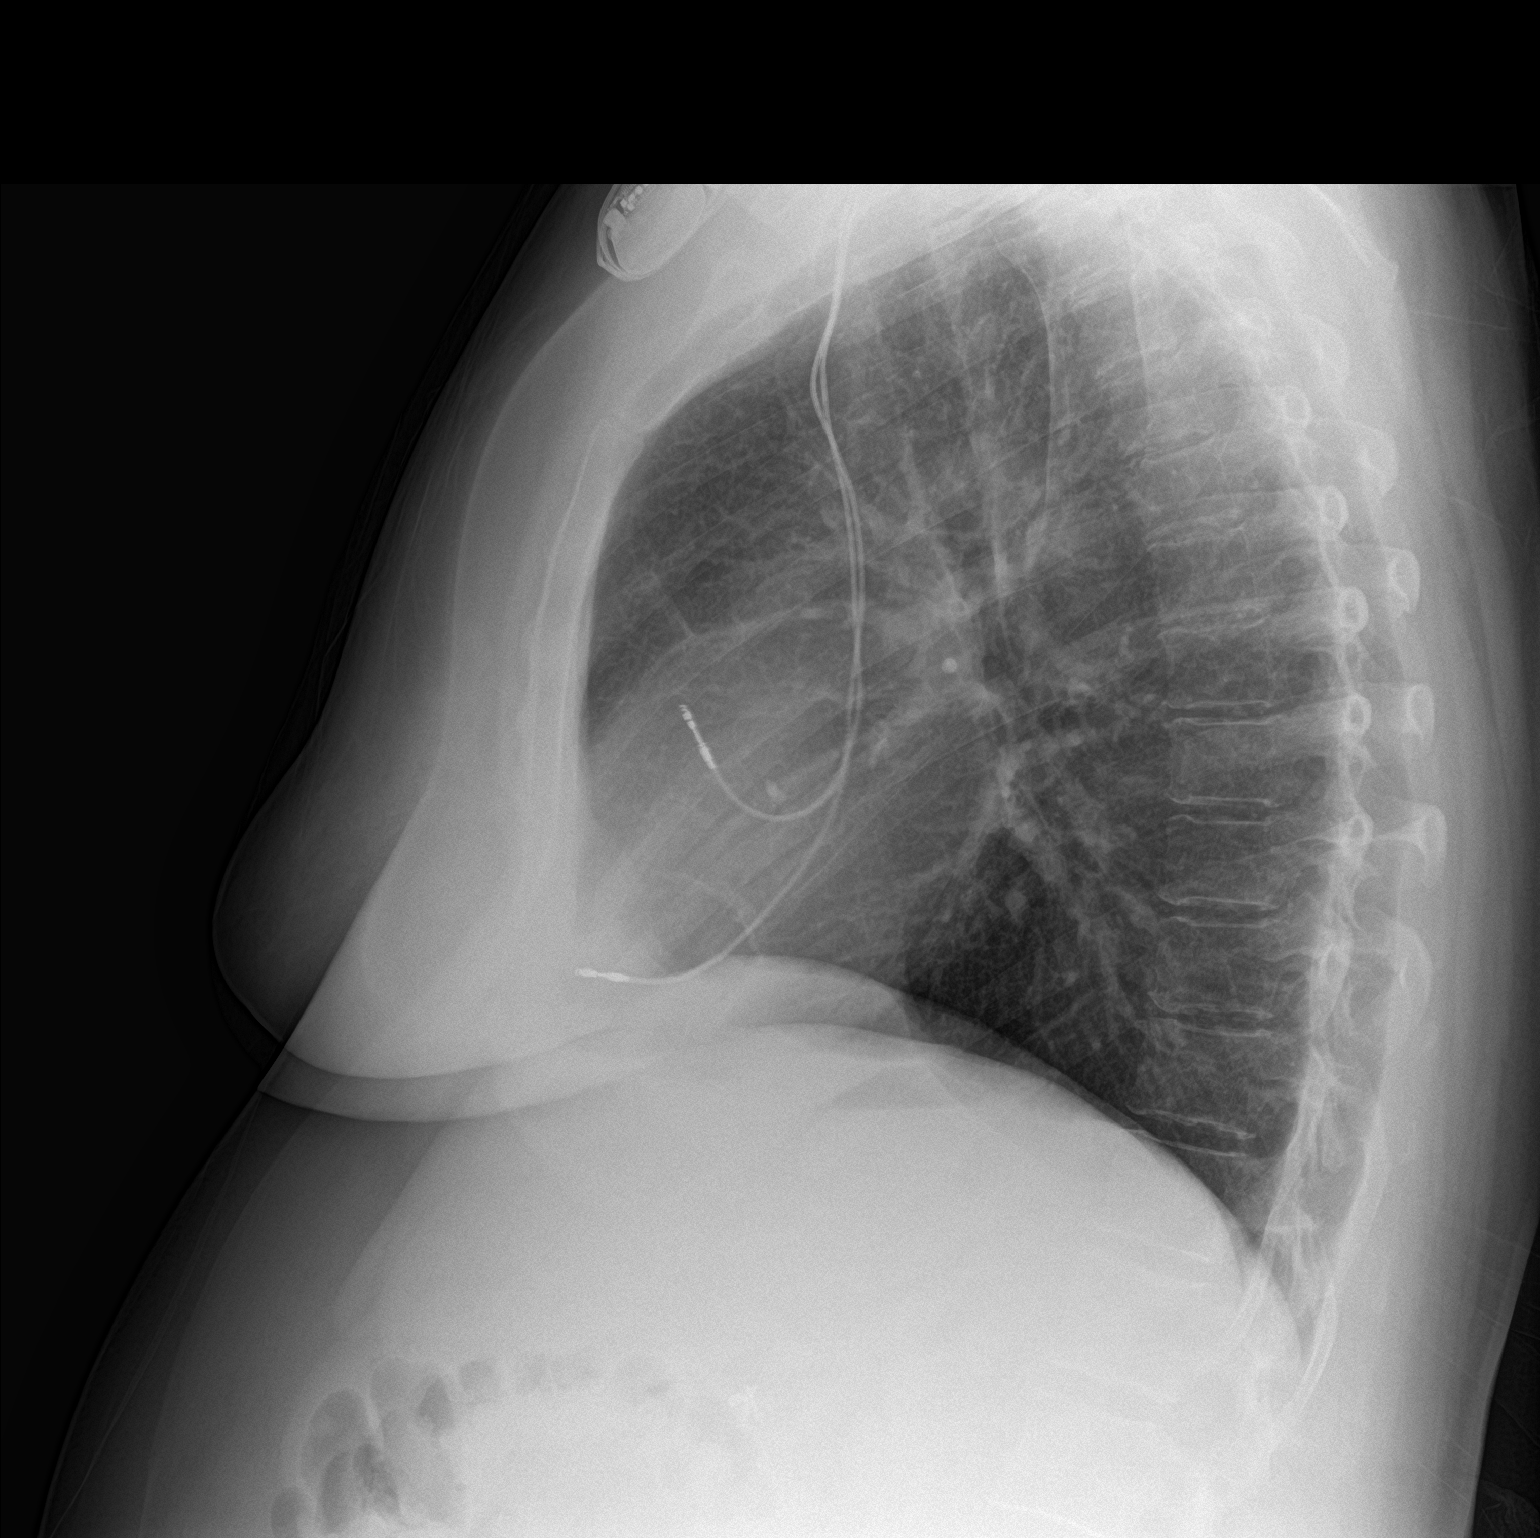

[2 of 2 positions shown; findings below may reference images not displayed]

FINDINGS: 2 lead pacing device is again seen. Lungs are clear. Heart size is
normal. No pneumothorax or pleural fluid. No acute or focal bony
abnormality.
IMPRESSION: Negative chest.

## 2020-04-07 ENCOUNTER — Ambulatory Visit (INDEPENDENT_AMBULATORY_CARE_PROVIDER_SITE_OTHER): Payer: 59 | Admitting: *Deleted

## 2020-04-07 DIAGNOSIS — I442 Atrioventricular block, complete: Secondary | ICD-10-CM

## 2020-04-07 LAB — CUP PACEART REMOTE DEVICE CHECK
Battery Remaining Longevity: 54 mo
Battery Remaining Percentage: 40 %
Battery Voltage: 2.84 V
Brady Statistic AP VP Percent: 1 %
Brady Statistic AP VS Percent: 1.8 %
Brady Statistic AS VP Percent: 1 %
Brady Statistic AS VS Percent: 98 %
Brady Statistic RA Percent Paced: 1.6 %
Brady Statistic RV Percent Paced: 1 %
Date Time Interrogation Session: 20210427031449
Implantable Lead Implant Date: 20111130
Implantable Lead Implant Date: 20111130
Implantable Lead Location: 753859
Implantable Lead Location: 753860
Implantable Pulse Generator Implant Date: 20111130
Lead Channel Impedance Value: 380 Ohm
Lead Channel Impedance Value: 410 Ohm
Lead Channel Pacing Threshold Amplitude: 0.75 V
Lead Channel Pacing Threshold Amplitude: 1.5 V
Lead Channel Pacing Threshold Pulse Width: 0.4 ms
Lead Channel Pacing Threshold Pulse Width: 0.4 ms
Lead Channel Sensing Intrinsic Amplitude: 2.6 mV
Lead Channel Sensing Intrinsic Amplitude: 4.2 mV
Lead Channel Setting Pacing Amplitude: 1.75 V
Lead Channel Setting Pacing Amplitude: 1.75 V
Lead Channel Setting Pacing Pulse Width: 0.4 ms
Lead Channel Setting Sensing Sensitivity: 2 mV
Pulse Gen Model: 2210
Pulse Gen Serial Number: 7192850

## 2020-04-08 NOTE — Progress Notes (Signed)
PPM Remote  

## 2020-04-20 ENCOUNTER — Telehealth: Payer: Self-pay | Admitting: *Deleted

## 2020-04-20 ENCOUNTER — Encounter: Payer: Self-pay | Admitting: Family Medicine

## 2020-04-20 ENCOUNTER — Telehealth (INDEPENDENT_AMBULATORY_CARE_PROVIDER_SITE_OTHER): Payer: 59 | Admitting: Family Medicine

## 2020-04-20 ENCOUNTER — Other Ambulatory Visit: Payer: Self-pay

## 2020-04-20 DIAGNOSIS — R059 Cough, unspecified: Secondary | ICD-10-CM

## 2020-04-20 DIAGNOSIS — R05 Cough: Secondary | ICD-10-CM

## 2020-04-20 DIAGNOSIS — J0100 Acute maxillary sinusitis, unspecified: Secondary | ICD-10-CM

## 2020-04-20 MED ORDER — AMOXICILLIN 500 MG PO CAPS: 500.0000 mg | ORAL_CAPSULE | Freq: Three times a day (TID) | ORAL | 0 refills | Status: DC

## 2020-04-20 MED ORDER — FLUTICASONE PROPIONATE 50 MCG/ACT NA SUSP: 2.0000 | Freq: Every day | NASAL | 1 refills | Status: DC

## 2020-04-20 MED ORDER — HYDROCODONE-HOMATROPINE 5-1.5 MG/5ML PO SYRP: 5.0000 mL | ORAL_SOLUTION | Freq: Every evening | ORAL | 0 refills | Status: DC | PRN

## 2020-04-20 NOTE — Progress Notes (Signed)
Virtual Visit via Video Note  I connected with [name] on 04/20/20 at  1:40 PM EDT by a video enabled telemedicine application and verified that I am speaking with the correct person using two identifiers.  Location: Patient: home Provider: office   I discussed the limitations of evaluation and management by telemedicine and the availability of in person appointments. The patient expressed understanding and agreed to proceed.      Patient ID: [name], female    DOB: [birth year], 59 y.o.   MRN: [id number]   Chief Complaint  Patient presents with  . Sinusitis   Subjective:    HPI Pt had video visit today. Head and chest Congestion and cough for 2 weeks. Tried flonase, allergy meds. otc cold meds, mucinex.   Pt stating congestion in head and chest that is worsening.  Coughing is very bad and dry.  Blowing nose a lot this week, started yellow/green, then turned clear.   No fever or ear pain. +sore throat from coughing and puffiness under eyes.  No sick contacts at home. No known covid contacts. otc taking- claritin, flonase, coricidin, mucinex and cough drops.  In past had "hydromet" for coughing and helped at night. She is a smoker.  Using advair and albuterol.  Not having to use more than usual.  No sob or wheezing.   Medical History [name] has a past medical history of Allergy, Anxiety, Arthritis, Asthma, Cataract, Depression, Esophageal stricture, GERD (gastroesophageal reflux disease), Heart defect, HLD (hyperlipidemia), Hypothyroidism, IBS (irritable bowel syndrome), IFG (impaired fasting glucose), Migraine headache, OSA (obstructive sleep apnea), Reflux, Sleep apnea, and Symptomatic bradycardia.   Outpatient Encounter Medications as of 04/20/2020  Medication Sig  . albuterol (VENTOLIN HFA) 108 (90 Base) MCG/ACT inhaler INHALE 2 PUFFS BY MOUTH EVERY 4 HOURS AS NEEDED FOR WHEEZING OR SHORTNESS OF BREATH  . ALPRAZolam (XANAX) 1 MG tablet TAKE 1/2 (ONE-HALF)  TABLET BY MOUTH TWICE DAILY AS NEEDED FOR ANXIETY OR  SLEEP  . fluticasone-salmeterol (ADVAIR HFA) 115-21 MCG/ACT inhaler Inhale 2 puffs into the lungs 2 (two) times daily.  Marland Kitchen levothyroxine (SYNTHROID) 88 MCG tablet Take one tablet po daily  . omeprazole (PRILOSEC) 20 MG capsule Take 1 capsule (20 mg total) by mouth 2 (two) times daily.  . rosuvastatin (CRESTOR) 10 MG tablet TAKE 1 TABLET BY MOUTH ONCE DAILY FOR CHOLESTEROL  . sertraline (ZOLOFT) 100 MG tablet Take 1.5 tablets (150 mg total) by mouth daily.  . Vitamin D, Ergocalciferol, (DRISDOL) 50000 units CAPS capsule Take 1 capsule (50,000 Units total) by mouth every 7 (seven) days.  . [DISCONTINUED] fluticasone (FLONASE) 50 MCG/ACT nasal spray Place 1 spray into both nostrils daily.  Marland Kitchen amoxicillin (AMOXIL) 500 MG capsule Take 1 capsule (500 mg total) by mouth 3 (three) times daily.  . fluticasone (FLONASE) 50 MCG/ACT nasal spray Place 2 sprays into both nostrils daily.  Marland Kitchen HYDROcodone-homatropine (HYCODAN) 5-1.5 MG/5ML syrup Take 5 mLs by mouth at bedtime as needed for cough.  . [DISCONTINUED] cefdinir (OMNICEF) 300 MG capsule Take 1 capsule (300 mg total) by mouth 2 (two) times daily.  . [DISCONTINUED] predniSONE (DELTASONE) 20 MG tablet Take 3 qd for 3 days then 2 qd for 3 days, then one qd for 3 days   No facility-administered encounter medications on file as of 04/20/2020.     Review of Systems  Constitutional: Negative for chills and fever.  HENT: Positive for congestion, rhinorrhea, sinus pressure, sinus pain and sore throat. Negative for ear pain, nosebleeds, postnasal  drip and sneezing.   Eyes: Negative for pain, discharge and itching.  Respiratory: Positive for cough. Negative for chest tightness, shortness of breath and wheezing.   Cardiovascular: Negative for chest pain.  Gastrointestinal: Negative for constipation, diarrhea, nausea and vomiting.  Skin: Negative for rash.  Neurological: Positive for headaches.      Vitals There were no vitals taken for this visit.  Objective:   Physical Exam  No PE due to video visit.  Assessment and Plan   1. Acute non-recurrent maxillary sinusitis - amoxicillin (AMOXIL) 500 MG capsule; Take 1 capsule (500 mg total) by mouth 3 (three) times daily.  Dispense: 30 capsule; Refill: 0 - fluticasone (FLONASE) 50 MCG/ACT nasal spray; Place 2 sprays into both nostrils daily.  Dispense: 16 g; Refill: 1  2. Coughing - HYDROcodone-homatropine (HYCODAN) 5-1.5 MG/5ML syrup; Take 5 mLs by mouth at bedtime as needed for cough.  Dispense: 60 mL; Refill: 0   Call or rto if worsening coughing or sob.  Pt in agreement.  F/u prn.   Follow Up Instructions:    I discussed the assessment and treatment plan with the patient. The patient was provided an opportunity to ask questions and all were answered. The patient agreed with the plan and demonstrated an understanding of the instructions.   The patient was advised to call back or seek an in-person evaluation if the symptoms worsen or if the condition fails to improve as anticipated.  I provided 15 minutes of non-face-to-face time during this encounter.

## 2020-04-20 NOTE — Telephone Encounter (Signed)
[name], cory are scheduled for a virtual visit with your provider today.    Just as we do with appointments in the office, we must obtain your consent to participate.  Your consent will be active for this visit and any virtual visit you may have with one of our providers in the next 365 days.    If you have a MyChart account, I can also send a copy of this consent to you electronically.  All virtual visits are billed to your insurance company just like a traditional visit in the office.  As this is a virtual visit, video technology does not allow for your provider to perform a traditional examination.  This may limit your provider's ability to fully assess your condition.  If your provider identifies any concerns that need to be evaluated in person or the need to arrange testing such as labs, EKG, etc, we will make arrangements to do so.    Although advances in technology are sophisticated, we cannot ensure that it will always work on either your end or our end.  If the connection with a video visit is poor, we may have to switch to a telephone visit.  With either a video or telephone visit, we are not always able to ensure that we have a secure connection.   I need to obtain your verbal consent now.   Are you willing to proceed with your visit today?   [name] has provided verbal consent on 04/20/2020 for a virtual visit (video or telephone).   Dayton Bailiff, LPN [id number]  [id number] PM

## 2020-05-12 ENCOUNTER — Other Ambulatory Visit: Payer: Self-pay | Admitting: Family Medicine

## 2020-05-12 NOTE — Telephone Encounter (Signed)
Needs appt. Thx, dr. Lovena Le

## 2020-05-13 NOTE — Telephone Encounter (Signed)
Needs appt. Thx, dr. Lovena Le

## 2020-07-07 ENCOUNTER — Ambulatory Visit (INDEPENDENT_AMBULATORY_CARE_PROVIDER_SITE_OTHER): Payer: 59 | Admitting: *Deleted

## 2020-07-07 DIAGNOSIS — I442 Atrioventricular block, complete: Secondary | ICD-10-CM

## 2020-07-07 LAB — CUP PACEART REMOTE DEVICE CHECK
Battery Remaining Longevity: 47 mo
Battery Remaining Percentage: 35 %
Battery Voltage: 2.83 V
Brady Statistic AP VP Percent: 1 %
Brady Statistic AP VS Percent: 1.9 %
Brady Statistic AS VP Percent: 1 %
Brady Statistic AS VS Percent: 98 %
Brady Statistic RA Percent Paced: 1.7 %
Brady Statistic RV Percent Paced: 1 %
Date Time Interrogation Session: 20210727025638
Implantable Lead Implant Date: 20111130
Implantable Lead Implant Date: 20111130
Implantable Lead Location: 753859
Implantable Lead Location: 753860
Implantable Pulse Generator Implant Date: 20111130
Lead Channel Impedance Value: 400 Ohm
Lead Channel Impedance Value: 440 Ohm
Lead Channel Pacing Threshold Amplitude: 0.75 V
Lead Channel Pacing Threshold Amplitude: 1.375 V
Lead Channel Pacing Threshold Pulse Width: 0.4 ms
Lead Channel Pacing Threshold Pulse Width: 0.4 ms
Lead Channel Sensing Intrinsic Amplitude: 3.4 mV
Lead Channel Sensing Intrinsic Amplitude: 4.2 mV
Lead Channel Setting Pacing Amplitude: 1.625
Lead Channel Setting Pacing Amplitude: 1.75 V
Lead Channel Setting Pacing Pulse Width: 0.4 ms
Lead Channel Setting Sensing Sensitivity: 2 mV
Pulse Gen Model: 2210
Pulse Gen Serial Number: 7192850

## 2020-07-10 ENCOUNTER — Encounter: Payer: Self-pay | Admitting: Nurse Practitioner

## 2020-07-10 ENCOUNTER — Other Ambulatory Visit: Payer: Self-pay

## 2020-07-10 ENCOUNTER — Ambulatory Visit (INDEPENDENT_AMBULATORY_CARE_PROVIDER_SITE_OTHER): Payer: 59 | Admitting: Nurse Practitioner

## 2020-07-10 ENCOUNTER — Ambulatory Visit (HOSPITAL_COMMUNITY)
Admission: RE | Admit: 2020-07-10 | Discharge: 2020-07-10 | Disposition: A | Discharge status: Home / Self Care | Payer: 59 | Source: Ambulatory Visit | Attending: Nurse Practitioner | Admitting: Nurse Practitioner

## 2020-07-10 VITALS — BP 128/84 | Temp 98.1°F | Ht 65.0 in | Wt 228.6 lb

## 2020-07-10 DIAGNOSIS — M25562 Pain in left knee: Secondary | ICD-10-CM | POA: Diagnosis present

## 2020-07-10 DIAGNOSIS — R7301 Impaired fasting glucose: Secondary | ICD-10-CM | POA: Diagnosis not present

## 2020-07-10 LAB — POCT GLYCOSYLATED HEMOGLOBIN (HGB A1C): Hemoglobin A1C: 5.4 % (ref 4.0–5.6)

## 2020-07-10 MED ORDER — PHENTERMINE HCL 37.5 MG PO TABS: 37.5000 mg | ORAL_TABLET | Freq: Every day | ORAL | 0 refills | Status: DC

## 2020-07-10 NOTE — Progress Notes (Signed)
Subjective:    Patient ID: [name], female    DOB: [birth year], 59 y.o.   MRN: [id number]  HPI  Onset over 1 year burning pain in lateral left thigh, tried gabapentin with no relief.  Onset 1 month ago tenderness noted in left lateral thigh radiating across left knee into medial knee and radiating from thigh down lateral left knee. Main concern today is left knee pain mainly anterior on both sides of the patellar more on the medial aspect.  No history of injury. Mild edema. No erythema    Review of Systems  Constitutional: Negative for activity change, appetite change, fatigue and fever.  Musculoskeletal: Positive for gait problem. Negative for back pain.       Knee pain  Skin: Negative for color change.       Objective:   Physical Exam Vitals and nursing note reviewed.  Constitutional:      Appearance: Normal appearance. She is obese.  Cardiovascular:     Rate and Rhythm: Normal rate and regular rhythm.     Pulses: Normal pulses.          Dorsalis pedis pulses are 2+ on the right side and 2+ on the left side.     Heart sounds: Normal heart sounds.  Pulmonary:     Effort: Pulmonary effort is normal. No respiratory distress.     Breath sounds: Normal breath sounds.  Musculoskeletal:     Lumbar back: Negative right straight leg raise test and negative left straight leg raise test.     Right knee: Crepitus present.     Left knee: Swelling present. Tenderness present.     Right foot: Normal capillary refill.     Left foot: Normal capillary refill.  Skin:    General: Skin is warm and dry.  Neurological:     Mental Status: She is alert.     Gait: Gait normal.     Deep Tendon Reflexes:     Reflex Scores:      Patellar reflexes are 2+ on the right side and 2+ on the left side.  Left knee: no joint laxity. Localized tenderness long the anterior medial aspect of the knee. Mild edema. Posterior: no tenderness or Baker's cyst. Gait slow but steady.  Vitals:   07/10/20 1110   BP: 128/84  Temp: 98.1 F (36.7 C)  Current BMI: 38 Results for orders placed or performed in visit on 07/10/20  POCT glycosylated hemoglobin (Hb A1C)  Result Value Ref Range   Hemoglobin A1C 5.4 4.0 - 5.6 %   HbA1c POC (<> result, manual entry)     HbA1c, POC (prediabetic range)     HbA1c, POC (controlled diabetic range)          Assessment & Plan:   Problem List Items Addressed This Visit      Other   Morbid obesity ([place])   Relevant Medications   phentermine (ADIPEX-P) 37.5 MG tablet    Other Visit Diagnoses    Acute pain of left knee    -  Primary   Relevant Orders   DG Knee Complete 4 Views Left (Completed)   Elevated fasting blood sugar       Relevant Orders   POCT glycosylated hemoglobin (Hb A1C) (Completed)     Xray pending.  Discussed with patient options for weight loss. Initial goal of weight loss: 10 lbs.  Discussed can apply OTC Voltaren gel to tender areas on left knee or Lidocaine patch.  May use  ace wrap or velcro knee support to left knee.   Has taken Phentermine in the past without difficulty.  Instructed patient to take Phentermine 1/2 tab qd, and monitor BP and heart rate. DC med call back if either increases. Discussed potential adverse effects.    Return in about 4 weeks (around 08/07/2020) for for medication follow-up .

## 2020-07-10 NOTE — Progress Notes (Signed)
Remote pacemaker transmission.   

## 2020-07-10 NOTE — Patient Instructions (Signed)
Please work on weight loss, 10 lbs to start.  OTC Voltaren gel, apply to tender areas on knee.  OTC Lidocaine patch to tender areas on knee.  Ace wrap or velcro knee support for knee.

## 2020-07-13 ENCOUNTER — Encounter: Payer: Self-pay | Admitting: Nurse Practitioner

## 2020-07-13 NOTE — Progress Notes (Signed)
   Subjective:    Patient ID: [name], female    DOB: [birth year], 59 y.o.   MRN: [id number]  HPI  See other note.   Review of Systems     Objective:   Physical Exam        Assessment & Plan:

## 2020-09-22 ENCOUNTER — Encounter: Payer: Self-pay | Admitting: Family Medicine

## 2020-09-22 ENCOUNTER — Ambulatory Visit (INDEPENDENT_AMBULATORY_CARE_PROVIDER_SITE_OTHER): Payer: 59 | Admitting: Family Medicine

## 2020-09-22 VITALS — BP 154/78 | HR 75 | Temp 98.0°F | Wt 228.0 lb

## 2020-09-22 DIAGNOSIS — M545 Low back pain, unspecified: Secondary | ICD-10-CM | POA: Diagnosis not present

## 2020-09-22 NOTE — Patient Instructions (Signed)
Rest your back for at least 3 weeks.  Continue the conservative measures you have been doing: heating pads and ice (alternating every 1-2 hours, 20 minutes at a time). Start Naproxen as instructed on bottle - take with food. Monitor your blood pressure at home and notify me if it doesn't come back to normal (<130/80).      Acute Back Pain, Adult Acute back pain is sudden and usually short-lived. It is often caused by an injury to the muscles and tissues in the back. The injury may result from:  A muscle or ligament getting overstretched or torn (strained). Ligaments are tissues that connect bones to each other. Lifting something improperly can cause a back strain.  Wear and tear (degeneration) of the spinal disks. Spinal disks are circular tissue that provides cushioning between the bones of the spine (vertebrae).  Twisting motions, such as while playing sports or doing yard work.  A hit to the back.  Arthritis. You may have a physical exam, lab tests, and imaging tests to find the cause of your pain. Acute back pain usually goes away with rest and home care. Follow these instructions at home: Managing pain, stiffness, and swelling  Take over-the-counter and prescription medicines only as told by your health care provider.  Your health care provider may recommend applying ice during the first 24-48 hours after your pain starts. To do this: ? Put ice in a plastic bag. ? Place a towel between your skin and the bag. ? Leave the ice on for 20 minutes, 2-3 times a day.  If directed, apply heat to the affected area as often as told by your health care provider. Use the heat source that your health care provider recommends, such as a moist heat pack or a heating pad. ? Place a towel between your skin and the heat source. ? Leave the heat on for 20-30 minutes. ? Remove the heat if your skin turns bright red. This is especially important if you are unable to feel pain, heat, or cold. You  have a greater risk of getting burned. Activity   Do not stay in bed. Staying in bed for more than 1-2 days can delay your recovery.  Sit up and stand up straight. Avoid leaning forward when you sit, or hunching over when you stand. ? If you work at a desk, sit close to it so you do not need to lean over. Keep your chin tucked in. Keep your neck drawn back, and keep your elbows bent at a right angle. Your arms should look like the letter "L." ? Sit high and close to the steering wheel when you drive. Add lower back (lumbar) support to your car seat, if needed.  Take short walks on even surfaces as soon as you are able. Try to increase the length of time you walk each day.  Do not sit, drive, or stand in one place for more than 30 minutes at a time. Sitting or standing for long periods of time can put stress on your back.  Do not drive or use heavy machinery while taking prescription pain medicine.  Use proper lifting techniques. When you bend and lift, use positions that put less stress on your back: ? [place] your knees. ? Keep the load close to your body. ? Avoid twisting.  Exercise regularly as told by your health care provider. Exercising helps your back heal faster and helps prevent back injuries by keeping muscles strong and flexible.  Work with a  physical therapist to make a safe exercise program, as recommended by your health care provider. Do any exercises as told by your physical therapist. Lifestyle  Maintain a healthy weight. Extra weight puts stress on your back and makes it difficult to have good posture.  Avoid activities or situations that make you feel anxious or stressed. Stress and anxiety increase muscle tension and can make back pain worse. Learn ways to manage anxiety and stress, such as through exercise. General instructions  Sleep on a firm mattress in a comfortable position. Try lying on your side with your knees slightly bent. If you lie on your back, put a  pillow under your knees.  Follow your treatment plan as told by your health care provider. This may include: ? Cognitive or behavioral therapy. ? Acupuncture or massage therapy. ? Meditation or yoga. Contact a health care provider if:  You have pain that is not relieved with rest or medicine.  You have increasing pain going down into your legs or buttocks.  Your pain does not improve after 2 weeks.  You have pain at night.  You lose weight without trying.  You have a fever or chills. Get help right away if:  You develop new bowel or bladder control problems.  You have unusual weakness or numbness in your arms or legs.  You develop nausea or vomiting.  You develop abdominal pain.  You feel faint. Summary  Acute back pain is sudden and usually short-lived.  Use proper lifting techniques. When you bend and lift, use positions that put less stress on your back.  Take over-the-counter and prescription medicines and apply heat or ice as directed by your health care provider. This information is not intended to replace advice given to you by your health care provider. Make sure you discuss any questions you have with your health care provider. Document Revised: 03/19/2019 Document Reviewed: 07/12/2017 Elsevier Patient Education  [place].

## 2020-09-22 NOTE — Progress Notes (Signed)
Patient ID: [name], female    DOB: 11/23/1961, 59 y.o.   MRN: [id number]   Chief Complaint  Patient presents with  . right hip pain    Patient reports a constant dull pain in right hip and lower back since cleaning the house last Sunday. Has utilized heating pads, icy hot and tylenol with little to no improvement. Patient is requesting refill on xanax, aware this may need to be filled by Dr. Lovena Le.    Subjective:  CC: pain in right buttock area  HPI 1 week ago this past Sunday doing routine cleaning at her home, later that evening started experiencing pain located in the right buttock area.  Describes as dull;  denies any radiation, no numbness and tingling or weakness.  Try Tylenol and heating pads is not helping that much also tried icy hot. Medical History Blen has a past medical history of Allergy, Anxiety, Arthritis, Asthma, Cataract, Depression, Esophageal stricture, GERD (gastroesophageal reflux disease), Heart defect, HLD (hyperlipidemia), Hypothyroidism, IBS (irritable bowel syndrome), IFG (impaired fasting glucose), Migraine headache, OSA (obstructive sleep apnea), Reflux, Sleep apnea, and Symptomatic bradycardia.   Outpatient Encounter Medications as of 09/22/2020  Medication Sig  . albuterol (VENTOLIN HFA) 108 (90 Base) MCG/ACT inhaler INHALE 2 PUFFS BY MOUTH EVERY 4 HOURS AS NEEDED FOR WHEEZING OR SHORTNESS OF BREATH  . ALPRAZolam (XANAX) 1 MG tablet TAKE 1/2 (ONE-HALF) TABLET BY MOUTH TWICE DAILY AS NEEDED FOR ANXIETY OR  SLEEP  . fluticasone (FLONASE) 50 MCG/ACT nasal spray Place 2 sprays into both nostrils daily.  . fluticasone-salmeterol (ADVAIR HFA) 115-21 MCG/ACT inhaler Inhale 2 puffs into the lungs 2 (two) times daily.  Marland Kitchen levothyroxine (SYNTHROID) 88 MCG tablet Take one tablet po daily  . omeprazole (PRILOSEC) 20 MG capsule Take 1 capsule (20 mg total) by mouth 2 (two) times daily.  . phentermine (ADIPEX-P) 37.5 MG tablet Take 1 tablet (37.5 mg total) by mouth  daily before breakfast.  . rosuvastatin (CRESTOR) 10 MG tablet TAKE 1 TABLET BY MOUTH ONCE DAILY FOR CHOLESTEROL  . sertraline (ZOLOFT) 100 MG tablet Take 1.5 tablets (150 mg total) by mouth daily.  . Vitamin D, Ergocalciferol, (DRISDOL) 50000 units CAPS capsule Take 1 capsule (50,000 Units total) by mouth every 7 (seven) days.   No facility-administered encounter medications on file as of 09/22/2020.     Review of Systems  Constitutional: Negative.   HENT: Negative.   Eyes: Negative.   Respiratory: Negative.   Cardiovascular: Negative.   Gastrointestinal: Negative.   Musculoskeletal: Positive for back pain.       Right side buttock area.   Neurological: Negative for weakness and numbness.     Vitals BP (!) 154/78   Pulse 75   Temp 98 F (36.7 C)   Wt 228 lb (103.4 kg)   SpO2 98%   BMI 37.94 kg/m   Objective:   Physical Exam Vitals and nursing note reviewed.  Constitutional:      General: She is not in acute distress.    Appearance: Normal appearance.  Eyes:     Extraocular Movements: Extraocular movements intact.     Pupils: Pupils are equal, round, and reactive to light.  Cardiovascular:     Rate and Rhythm: Regular rhythm.     Heart sounds: Normal heart sounds.  Pulmonary:     Effort: Pulmonary effort is normal.     Breath sounds: Normal breath sounds.  Neurological:     Mental Status: She is alert and oriented to  person, place, and time.     Cranial Nerves: No cranial nerve deficit.     Sensory: No sensory deficit.     Motor: No weakness.     Coordination: Coordination normal.     Gait: Gait normal.     Comments: Limping on right side with walking. Able to walk across room on tip toes and heels. Denies saddle paresthesia.       Assessment and Plan   1. Acute right-sided low back pain without sciatica   [name] presents in the office today with acute low back pain mostly on the right buttock area.  A week ago Sunday she was doing routine cleaning  in her home and then later that evening started having a dull lower back pain located mostly in the right buttock area.  She has tried Tylenol and heating pads not helping that much and occasionally using icy hot.  Having difficulty sitting, standing, and  lying says the pain is 8 out of 10 today.  Difficult to get comfortable.  I explained that most low back pain will resolve itself with rest, conservative treatments, and anti-inflammatories.  She does not have a history of disc injury: pertinent negatives include no radiating pain no numbness no tingling no muscle spasms, no weakness in extremities (upper and lower).  Her blood pressure was elevated in the office today, recheck was 118/70.  She will monitor her blood pressure for a few times after she starts her anti-inflammatory and notify us if it does not remain normal.  The plan is to start naproxen as instructed on the bottle, continue with her conservative measures- she will alternate ice and heating pad every 1-2 hours;  she will rest this area and not do any strenuous housework for at least 3 weeks. Longer if indicated. She will follow up in 4 weeks if needed, she understands that if she is completely better she can cancel this appointment, I would like this appointment on my schedule so that if she is not better we can decide what to do next.  I explained that most lower back pain resolves on its own with rest and conservative measures. Her neuro exam today in the office was completely normal.  She denies any saddle paresthesia, she is able to walk across the room on her toes and her heels.  No red flags were presented today during this neuro exam.  She requested a refill of her Xanax today, I instructed her that she is due for her annual physical, and she will schedule this with Dr. Lovena Le on her way out today.  Her Xanax refill will be addressed by Dr. Lovena Le at that visit.  Agrees with plan of care discussed today. Understands warning signs  to seek further care:  Understands to follow-up iny radiating pain difficulty walking she will seek care immediately. Understands to follow-up in 4 weeks.  She understands she can notify us earlier if needed.  She needs her annual physical, she will schedule this with Dr. Lovena Le.

## 2020-09-23 ENCOUNTER — Telehealth: Payer: Self-pay | Admitting: Family Medicine

## 2020-09-23 NOTE — Telephone Encounter (Signed)
-----   Message from Chalmers Guest, NP sent at 09/22/2020  9:04 AM EDT ----- Dr. Lovena Le,  This patient is due for annual physical with labs. She saw me today for back pain. She also requested Xanax refill (I did not refill this). Hopefully she scheduled with you on the way out.  Fyi- I also will let you order labs. Thanks, karen

## 2020-09-24 ENCOUNTER — Telehealth: Payer: Self-pay

## 2020-09-24 ENCOUNTER — Other Ambulatory Visit: Payer: Self-pay | Admitting: *Deleted

## 2020-09-24 DIAGNOSIS — Z0000 Encounter for general adult medical examination without abnormal findings: Secondary | ICD-10-CM

## 2020-09-24 DIAGNOSIS — E559 Vitamin D deficiency, unspecified: Secondary | ICD-10-CM

## 2020-09-24 DIAGNOSIS — E785 Hyperlipidemia, unspecified: Secondary | ICD-10-CM

## 2020-09-24 DIAGNOSIS — Z79899 Other long term (current) drug therapy: Secondary | ICD-10-CM

## 2020-09-24 DIAGNOSIS — M25551 Pain in right hip: Secondary | ICD-10-CM

## 2020-09-24 NOTE — Telephone Encounter (Signed)
Pt states she asked for xray not MRI. States she cannot have MRI because she has a pacemaker. States she has tried heat, ice, aleve, and exercise with no relief. States her right leg is throbbing and hard to get comfortable. No radiating pain, no numbness, no tingling, no weakness.

## 2020-09-24 NOTE — Telephone Encounter (Signed)
Patient was seen 10/12 for right hip pain and noow she states the pain is worst moving into her right leg. She is requesting x-ray or MRI of her right side to find out what is causing so much pain. Please advise

## 2020-09-24 NOTE — Telephone Encounter (Signed)
Please find out if [name] can come in for an office visit tomorrow so I can compare her physical exam to 2 days ago. If she needs an x-ray, we can send her over for a stat x-ray tomorrow.  Have her symptoms changed? Radiating pain, numbness or tingling, weakness? If yes, it may be appropriate for her to go to the ED where they won't need insurance company approval for diagnostic testing. Thanks, Santiago Glad

## 2020-09-24 NOTE — Telephone Encounter (Signed)
Talked with Santiago Glad and xray of right pelvis and right hip. Order put in and pt was notified.

## 2020-09-25 ENCOUNTER — Other Ambulatory Visit: Payer: Self-pay

## 2020-09-25 ENCOUNTER — Ambulatory Visit (HOSPITAL_COMMUNITY)
Admission: RE | Admit: 2020-09-25 | Discharge: 2020-09-25 | Disposition: A | Discharge status: Home / Self Care | Payer: 59 | Source: Ambulatory Visit | Attending: Family Medicine | Admitting: Family Medicine

## 2020-09-25 DIAGNOSIS — M25551 Pain in right hip: Secondary | ICD-10-CM | POA: Insufficient documentation

## 2020-09-28 ENCOUNTER — Other Ambulatory Visit: Payer: Self-pay | Admitting: *Deleted

## 2020-09-28 ENCOUNTER — Telehealth: Payer: Self-pay

## 2020-09-28 DIAGNOSIS — M25551 Pain in right hip: Secondary | ICD-10-CM

## 2020-09-28 MED ORDER — DICLOFENAC SODIUM 75 MG PO TBEC: 75.0000 mg | DELAYED_RELEASE_TABLET | Freq: Two times a day (BID) | ORAL | 0 refills | Status: DC

## 2020-09-28 NOTE — Telephone Encounter (Signed)
Patient notified and referral entered in epic.

## 2020-09-28 NOTE — Telephone Encounter (Signed)
This pt saw karen 10/12

## 2020-09-28 NOTE — Telephone Encounter (Signed)
Patient said the hip pain is bad and the aleve and any of the otc meds is not helping.  She is in pain and wants something stronger called in and wants to know the next step. Referral?   [facility] in [facility]

## 2020-09-28 NOTE — Telephone Encounter (Signed)
Would recommend diclofenac instead of naproxen.  Take this 2x per day with food.  Sent to her pharamcy. Cont the heat/ice and streches.   Give ortho referral. (dx. Rt hip pain)  If not improving or worsening, then call office back.  Dr. Lovena Le

## 2020-10-02 MED ORDER — ALPRAZOLAM 0.5 MG PO TABS: 0.5000 mg | ORAL_TABLET | Freq: Two times a day (BID) | ORAL | 0 refills | Status: DC | PRN

## 2020-10-02 NOTE — Telephone Encounter (Signed)
-----   Message from Carmelina Noun, LPN sent at [id number]/75/[id number]  8:52 AM EDT ----- Called and discussed with pt. Pt states she will get bw done before visit and she does need a refill on xanax. [facility]. She is out.  ----- Message ----- From: Carmelina Noun, LPN Sent: [id number]/51/8841   8:52 AM EDT To: [facility] Pool  I put in bw orders and tried to call pt. No answer ----- Message ----- From: Erven Colla, DO Sent: 09/23/2020   5:04 PM EDT To: [facility] Clinical Pool  Pls order labs,   Cbc, cmp, lipids, vit d.   Fasting prior to appt, 3-7 days prior.  Also pt requesting xanax, has she run out or does she have enough till her appt?  Thx.   Dr. Lovena Le ----- Message ----- From: Chalmers Guest, NP Sent: 09/22/2020   9:04 AM EDT To: Erven Colla, DO  Dr. Lovena Le,  This patient is due for annual physical with labs. She saw me today for back pain. She also requested Xanax refill (I did not refill this). Hopefully she scheduled with you on the way out.  Fyi- I also will let you order labs. Thanks, karen

## 2020-10-06 ENCOUNTER — Ambulatory Visit (INDEPENDENT_AMBULATORY_CARE_PROVIDER_SITE_OTHER): Payer: 59

## 2020-10-06 DIAGNOSIS — I442 Atrioventricular block, complete: Secondary | ICD-10-CM | POA: Diagnosis not present

## 2020-10-06 LAB — CUP PACEART REMOTE DEVICE CHECK
Battery Remaining Longevity: 47 mo
Battery Remaining Percentage: 35 %
Battery Voltage: 2.83 V
Brady Statistic AP VP Percent: 1 %
Brady Statistic AP VS Percent: 1.9 %
Brady Statistic AS VP Percent: 1 %
Brady Statistic AS VS Percent: 98 %
Brady Statistic RA Percent Paced: 1.7 %
Brady Statistic RV Percent Paced: 1 %
Date Time Interrogation Session: 20211026021025
Implantable Lead Implant Date: 20111130
Implantable Lead Implant Date: 20111130
Implantable Lead Location: 753859
Implantable Lead Location: 753860
Implantable Pulse Generator Implant Date: 20111130
Lead Channel Impedance Value: 400 Ohm
Lead Channel Impedance Value: 430 Ohm
Lead Channel Pacing Threshold Amplitude: 0.75 V
Lead Channel Pacing Threshold Amplitude: 1.875 V
Lead Channel Pacing Threshold Pulse Width: 0.4 ms
Lead Channel Pacing Threshold Pulse Width: 0.4 ms
Lead Channel Sensing Intrinsic Amplitude: 3.1 mV
Lead Channel Sensing Intrinsic Amplitude: 5 mV
Lead Channel Setting Pacing Amplitude: 1.75 V
Lead Channel Setting Pacing Amplitude: 2.125
Lead Channel Setting Pacing Pulse Width: 0.4 ms
Lead Channel Setting Sensing Sensitivity: 2 mV
Pulse Gen Model: 2210
Pulse Gen Serial Number: 7192850

## 2020-10-09 LAB — COMPREHENSIVE METABOLIC PANEL
ALT: 22 IU/L (ref 0–32)
AST: 16 IU/L (ref 0–40)
Albumin/Globulin Ratio: 2.1 (ref 1.2–2.2)
Albumin: 4.5 g/dL (ref 3.8–4.9)
Alkaline Phosphatase: 82 IU/L (ref 44–121)
BUN/Creatinine Ratio: 17 (ref 9–23)
BUN: 15 mg/dL (ref 6–24)
Bilirubin Total: 0.2 mg/dL (ref 0.0–1.2)
CO2: 26 mmol/L (ref 20–29)
Calcium: 9.7 mg/dL (ref 8.7–10.2)
Chloride: 103 mmol/L (ref 96–106)
Creatinine, Ser: 0.89 mg/dL (ref 0.57–1.00)
GFR calc Af Amer: 83 mL/min/{1.73_m2} (ref >59)
GFR calc non Af Amer: 72 mL/min/{1.73_m2} (ref >59)
Globulin, Total: 2.1 g/dL (ref 1.5–4.5)
Glucose: 96 mg/dL (ref 65–99)
Potassium: 4.5 mmol/L (ref 3.5–5.2)
Sodium: 142 mmol/L (ref 134–144)
Total Protein: 6.6 g/dL (ref 6.0–8.5)

## 2020-10-09 LAB — CBC WITH DIFFERENTIAL/PLATELET
Basophils Absolute: 0.1 10*3/uL (ref 0.0–0.2)
Basos: 1 %
EOS (ABSOLUTE): 0.1 10*3/uL (ref 0.0–0.4)
Eos: 1 %
Hematocrit: 47 % — ABNORMAL HIGH (ref 34.0–46.6)
Hemoglobin: 15.8 g/dL (ref 11.1–15.9)
Immature Grans (Abs): 0.1 10*3/uL (ref 0.0–0.1)
Immature Granulocytes: 1 %
Lymphocytes Absolute: 2 10*3/uL (ref 0.7–3.1)
Lymphs: 18 %
MCH: 31 pg (ref 26.6–33.0)
MCHC: 33.6 g/dL (ref 31.5–35.7)
MCV: 92 fL (ref 79–97)
Monocytes Absolute: 0.9 10*3/uL (ref 0.1–0.9)
Monocytes: 8 %
Neutrophils Absolute: 8.1 10*3/uL — ABNORMAL HIGH (ref 1.4–7.0)
Neutrophils: 71 %
Platelets: 455 10*3/uL — ABNORMAL HIGH (ref 150–450)
RBC: 5.09 x10E6/uL (ref 3.77–5.28)
RDW: 13.3 % (ref 11.7–15.4)
WBC: 11.2 10*3/uL — ABNORMAL HIGH (ref 3.4–10.8)

## 2020-10-09 LAB — VITAMIN D 25 HYDROXY (VIT D DEFICIENCY, FRACTURES): Vit D, 25-Hydroxy: 24.5 ng/mL — ABNORMAL LOW (ref 30.0–100.0)

## 2020-10-09 LAB — LIPID PANEL
Chol/HDL Ratio: 4.3 ratio (ref 0.0–4.4)
Cholesterol, Total: 217 mg/dL — ABNORMAL HIGH (ref 100–199)
HDL: 51 mg/dL (ref >39)
LDL Chol Calc (NIH): 139 mg/dL — ABNORMAL HIGH (ref 0–99)
Triglycerides: 150 mg/dL — ABNORMAL HIGH (ref 0–149)
VLDL Cholesterol Cal: 27 mg/dL (ref 5–40)

## 2020-10-09 NOTE — Progress Notes (Signed)
Remote pacemaker transmission.   

## 2020-10-15 ENCOUNTER — Encounter: Payer: Self-pay | Admitting: Nurse Practitioner

## 2020-10-16 NOTE — Telephone Encounter (Signed)
I believe this patient saw you for this issue. Wanted to forward to you. [name]

## 2020-10-19 NOTE — Telephone Encounter (Signed)
Per Santiago Glad pt to ask ortho if massage or chiropractor is ok.

## 2020-10-23 ENCOUNTER — Encounter: Payer: 59 | Admitting: Family Medicine

## 2020-10-23 ENCOUNTER — Telehealth: Payer: Self-pay

## 2020-10-23 MED ORDER — CHLORZOXAZONE 500 MG PO TABS: ORAL_TABLET | ORAL | 0 refills | Status: DC

## 2020-10-23 NOTE — Telephone Encounter (Signed)
Parafon forte 1 tablet every 6 hours as needed, #30 caution drowsiness

## 2020-10-23 NOTE — Telephone Encounter (Signed)
Script sent to pharmacy and pt is aware 

## 2020-10-23 NOTE — Telephone Encounter (Signed)
Patient had appointment today to recheck back pain but provider is out. Rescheduled until 11/15 with karen . She is requesting a muscle relaxer called into [facility] because she hadnt been able to sleep due to pain. Please advise

## 2020-10-26 ENCOUNTER — Ambulatory Visit (INDEPENDENT_AMBULATORY_CARE_PROVIDER_SITE_OTHER): Payer: 59 | Admitting: Family Medicine

## 2020-10-26 ENCOUNTER — Other Ambulatory Visit: Payer: Self-pay | Admitting: Family Medicine

## 2020-10-26 ENCOUNTER — Other Ambulatory Visit: Payer: Self-pay

## 2020-10-26 ENCOUNTER — Encounter: Payer: Self-pay | Admitting: Family Medicine

## 2020-10-26 VITALS — BP 128/82 | HR 63 | Temp 98.0°F | Ht 65.0 in | Wt 229.8 lb

## 2020-10-26 DIAGNOSIS — E559 Vitamin D deficiency, unspecified: Secondary | ICD-10-CM | POA: Diagnosis not present

## 2020-10-26 MED ORDER — VITAMIN D (ERGOCALCIFEROL) 1.25 MG (50000 UNIT) PO CAPS: 50000.0000 [IU] | ORAL_CAPSULE | ORAL | 2 refills | Status: DC

## 2020-10-26 NOTE — Progress Notes (Signed)
Patient ID: [name], female    DOB: 06/07/1961, 59 y.o.   MRN: [id number]   Chief Complaint  Patient presents with  . Back Pain    follow up- still having pain- currently doing PT and chiropractic visits   Subjective:  CC: back pain/piriformis syndrome (per ortho)  -Follow-up for back pain, she is seen by orthopedics, treated with prednisone and physical therapy.  She is currently seeing physical therapy twice per week.  Her pain is not improved.  She is also seeing chiropractor and is will consider massage therapy.  She reports that she had recent lab work and would like to review those today.  It is difficult for her to find a comfortable position, is interfering with her sleep.  Orthopedics diagnosed her with piriformis syndrome.  Unfortunately this will just take time and physical therapy.    Medical History Shakela has a past medical history of Allergy, Anxiety, Arthritis, Asthma, Cataract, Depression, Esophageal stricture, GERD (gastroesophageal reflux disease), Heart defect, HLD (hyperlipidemia), Hypothyroidism, IBS (irritable bowel syndrome), IFG (impaired fasting glucose), Migraine headache, OSA (obstructive sleep apnea), Reflux, Sleep apnea, and Symptomatic bradycardia.   Outpatient Encounter Medications as of 10/26/2020  Medication Sig  . albuterol (VENTOLIN HFA) 108 (90 Base) MCG/ACT inhaler INHALE 2 PUFFS BY MOUTH EVERY 4 HOURS AS NEEDED FOR WHEEZING OR SHORTNESS OF BREATH  . ALPRAZolam (XANAX) 0.5 MG tablet Take 1 tablet (0.5 mg total) by mouth 2 (two) times daily as needed for anxiety. TAKE 1/2 (ONE-HALF) TABLET BY MOUTH TWICE DAILY AS NEEDED FOR ANXIETY OR  SLEEP  . chlorzoxazone (PARAFON FORTE DSC) 500 MG tablet Take one tablet po q 6 hrs prn. CAUTION; DROWSINESS  . fluticasone (FLONASE) 50 MCG/ACT nasal spray Place 2 sprays into both nostrils daily.  . fluticasone-salmeterol (ADVAIR HFA) 115-21 MCG/ACT inhaler Inhale 2 puffs into the lungs 2 (two) times daily.  Marland Kitchen  levothyroxine (SYNTHROID) 88 MCG tablet Take one tablet po daily  . omeprazole (PRILOSEC) 20 MG capsule Take 1 capsule (20 mg total) by mouth 2 (two) times daily.  . phentermine (ADIPEX-P) 37.5 MG tablet Take 1 tablet (37.5 mg total) by mouth daily before breakfast.  . rosuvastatin (CRESTOR) 10 MG tablet TAKE 1 TABLET BY MOUTH ONCE DAILY FOR CHOLESTEROL  . sertraline (ZOLOFT) 100 MG tablet Take 1.5 tablets (150 mg total) by mouth daily.  . [DISCONTINUED] diclofenac (VOLTAREN) 75 MG EC tablet Take 1 tablet (75 mg total) by mouth 2 (two) times daily.  . [DISCONTINUED] Vitamin D, Ergocalciferol, (DRISDOL) 50000 units CAPS capsule Take 1 capsule (50,000 Units total) by mouth every 7 (seven) days.   No facility-administered encounter medications on file as of 10/26/2020.     Review of Systems  Constitutional: Negative for chills and fever.  Respiratory: Negative for cough, chest tightness and shortness of breath.   Cardiovascular: Negative for chest pain.  Gastrointestinal: Negative for abdominal pain.  Musculoskeletal: Positive for back pain.       Piriformis syndrome per ortho     Vitals BP 128/82   Pulse 63   Temp 98 F (36.7 C) (Oral)   Ht 5\' 5"  (1.651 m)   Wt 229 lb 12.8 oz (104.2 kg)   SpO2 98%   BMI 38.24 kg/m   Objective:   Physical Exam Vitals and nursing note reviewed.  Constitutional:      General: She is not in acute distress.    Comments: Uncomfortable appearing.  Cardiovascular:     Rate and Rhythm: Regular  rhythm.     Heart sounds: Normal heart sounds.  Pulmonary:     Effort: Pulmonary effort is normal.     Breath sounds: Normal breath sounds.  Musculoskeletal:     Comments: Right-sided piriformis syndrome, seeing physical therapy twice per week.  Skin:    General: Skin is warm and dry.  Neurological:     Mental Status: She is alert and oriented to person, place, and time.  Psychiatric:        Mood and Affect: Mood normal.        Behavior: Behavior  normal.        Thought Content: Thought content normal.        Judgment: Judgment normal.      Assessment and Plan   1. Vitamin D deficiency - VITAMIN D 25 Hydroxy (Vit-D Deficiency, Fractures)   There is nothing further I can offer for her back pain, she is undergoing appropriate treatment for that, and  unfortunately this will take time to improve.  She is seen physical therapy, chiropractor, and wants to consider massage therapy.  Recent lab work reviewed in detail.  Slightly elevated cholesterol and LDL levels, lifestyle modification discussed however it is difficult for her to walk and to be active with her current musculoskeletal pain.  We discussed dietary improvements, she has texture issues with certain vegetables, we discussed some strategies to overcome that.  Her vitamin D level is 24.5, she will take 50,000 units weekly for 6 weeks and we will recheck at that time.  Agrees with plan of care discussed today. Understands warning signs to seek further care: Chest pain, shortness of breath, any significant changes in health status. Understands to follow-up for annual wellness.  Pecolia Ades, FNP-C

## 2020-10-26 NOTE — Patient Instructions (Signed)
Piriformis Syndrome  Piriformis syndrome is a condition that can cause pain and numbness in your buttocks and down the back of your leg. Piriformis syndrome happens when the small muscle that connects the base of your spine to your hip (piriformis muscle) presses on the nerve that runs down the back of your leg (sciatic nerve). The piriformis muscle helps your hip rotate and helps to bring your leg back and out. It also helps shift your weight to keep you stable while you are walking. The sciatic nerve runs under or through the piriformis muscle. Damage to the piriformis muscle can cause spasms that put pressure on the nerve below. This causes pain and discomfort while sitting and moving. The pain may feel as if it begins in the buttock and spreads (radiates) down your hip and thigh. What are the causes? This condition is caused by pressure on the sciatic nerve from the piriformis muscle. The piriformis muscle can get irritated with overuse, especially if other hip muscles are weak and the piriformis muscle has to do extra work. Piriformis syndrome can also occur after an injury, like a fall onto your buttocks. What increases the risk? You are more likely to develop this condition if you:  Are a woman.  Sit for long periods of time.  Are a cyclist.  Have weak buttocks muscles (gluteal muscles). What are the signs or symptoms? Symptoms of this condition include:  Pain, tingling, or numbness that starts in the buttock and runs down the back of your leg (sciatica).  Pain in the groin or thigh area. Your symptoms may get worse:  The longer you sit.  When you walk, run, or climb stairs.  When straining to have a bowel movement. How is this diagnosed? This condition is diagnosed based on your symptoms, medical history, and physical exam.  During the exam, your health care provider may: ? Move your leg into different positions to check for pain. ? Press on the muscles of your hip and  buttock to see if that increases your symptoms.  You may also have tests, including: ? Imaging tests such as X-rays, MRI, or ultrasound. ? Electromyogram (EMG). This test measures electrical signals sent by your nerves into the muscles. ? Nerve conduction study. This test measures how well electrical signals pass through your nerves. How is this treated? This condition may be treated by:  Stopping all activities that cause pain or make your condition worse.  Applying ice or using heat therapy.  Taking medicines to reduce pain and swelling.  Taking a muscle relaxer (muscle relaxant) to stop muscle spasms.  Doing range-of-motion and strengthening exercises (physical therapy) as told by your health care provider.  Massaging the area.  Having acupuncture.  Getting an injection of medicine in the piriformis muscle. Your health care provider will choose the medicine based on your condition. He or she may inject: ? An anti-inflammatory medicine (steroid) to reduce swelling. ? A numbing medicine (local anesthetic) to block the pain. ? Botulinum toxin. The toxin blocks nerve impulses to specific muscles to reduce muscle tension. In rare cases, you may need surgery to cut the muscle and release pressure on the nerve if other treatments do not work. Follow these instructions at home: Activity  Do not sit for long periods. Get up and walk around every 20 minutes or as often as told by your health care provider. ? When driving long distances, make sure to take frequent stops to get up and stretch.  Use a  cushion when you sit on hard surfaces.  Do exercises as told by your health care provider.  Return to your normal activities as told by your health care provider. Ask your health care provider what activities are safe for you. Managing pain, stiffness, and swelling      If directed, apply heat to the affected area as often as told by your health care provider. Use the heat source that  your health care provider recommends, such as a moist heat pack or a heating pad. ? Place a towel between your skin and the heat source. ? Leave the heat on for 20-30 minutes. ? Remove the heat if your skin turns bright red. This is especially important if you are unable to feel pain, heat, or cold. You may have a greater risk of getting burned.  If directed, put ice on the injured area. ? Put ice in a plastic bag. ? Place a towel between your skin and the bag. ? Leave the ice on for 20 minutes, 2-3 times a day. General instructions  Take over-the-counter and prescription medicines only as told by your health care provider.  Ask your health care provider if the medicine prescribed to you requires you to avoid driving or using heavy machinery.  You may need to take actions to prevent or treat constipation, such as: ? Drink enough fluid to keep your urine pale yellow. ? Take over-the-counter or prescription medicines. ? Eat foods that are high in fiber, such as beans, whole grains, and fresh fruits and vegetables. ? Limit foods that are high in fat and processed sugars, such as fried or sweet foods.  Keep all follow-up visits as told by your health care provider. This is important. How is this prevented?  Do not sit for longer than 20 minutes at a time. When you sit, choose padded surfaces.  Warm up and stretch before being active.  Cool down and stretch after being active.  Give your body time to rest between periods of activity.  Make sure to use equipment that fits you.  Maintain physical fitness, including: ? Strength. ? Flexibility. Contact a health care provider if:  Your pain and stiffness continue or get worse.  Your leg or hip becomes weak.  You have changes in your bowel function or bladder function. Summary  Piriformis syndrome is a condition that can cause pain, tingling, and numbness in your buttocks and down the back of your leg.  You may try applying heat  or ice to relieve the pain.  Do not sit for long periods. Get up and walk around every 20 minutes or as often as told by your health care provider. This information is not intended to replace advice given to you by your health care provider. Make sure you discuss any questions you have with your health care provider. Document Revised: 03/21/2019 Document Reviewed: 07/25/2018 Elsevier Patient Education  [employer].

## 2020-12-01 ENCOUNTER — Other Ambulatory Visit: Payer: Self-pay | Admitting: *Deleted

## 2020-12-01 DIAGNOSIS — R918 Other nonspecific abnormal finding of lung field: Secondary | ICD-10-CM

## 2020-12-01 NOTE — Progress Notes (Signed)
ct 

## 2020-12-09 ENCOUNTER — Telehealth: Payer: Self-pay | Admitting: Family Medicine

## 2020-12-15 ENCOUNTER — Telehealth: Payer: Self-pay | Admitting: Family Medicine

## 2020-12-22 ENCOUNTER — Telehealth: Payer: Self-pay | Admitting: Family Medicine

## 2020-12-22 NOTE — Telephone Encounter (Signed)
Pt left message on nurse line. Looked in pt chart and saw where Loma Sousa had left message to return call due to CT scan. Pt would like to have scan done in [place] if possible.

## 2020-12-29 ENCOUNTER — Other Ambulatory Visit: Payer: 59

## 2020-12-31 ENCOUNTER — Other Ambulatory Visit: Payer: Self-pay

## 2020-12-31 ENCOUNTER — Ambulatory Visit (INDEPENDENT_AMBULATORY_CARE_PROVIDER_SITE_OTHER): Payer: 59

## 2020-12-31 DIAGNOSIS — R918 Other nonspecific abnormal finding of lung field: Secondary | ICD-10-CM | POA: Diagnosis not present

## 2021-01-05 ENCOUNTER — Ambulatory Visit (INDEPENDENT_AMBULATORY_CARE_PROVIDER_SITE_OTHER): Payer: 59

## 2021-01-05 DIAGNOSIS — I442 Atrioventricular block, complete: Secondary | ICD-10-CM

## 2021-01-05 LAB — CUP PACEART REMOTE DEVICE CHECK
Battery Remaining Longevity: 34 mo
Battery Remaining Percentage: 25 %
Battery Voltage: 2.8 V
Brady Statistic AP VP Percent: 1 %
Brady Statistic AP VS Percent: 1.7 %
Brady Statistic AS VP Percent: 1 %
Brady Statistic AS VS Percent: 98 %
Brady Statistic RA Percent Paced: 1.5 %
Brady Statistic RV Percent Paced: 1 %
Date Time Interrogation Session: 20220125034437
Implantable Lead Implant Date: 20111130
Implantable Lead Implant Date: 20111130
Implantable Lead Location: 753859
Implantable Lead Location: 753860
Implantable Pulse Generator Implant Date: 20111130
Lead Channel Impedance Value: 380 Ohm
Lead Channel Impedance Value: 410 Ohm
Lead Channel Pacing Threshold Amplitude: 0.875 V
Lead Channel Pacing Threshold Amplitude: 1.75 V
Lead Channel Pacing Threshold Pulse Width: 0.4 ms
Lead Channel Pacing Threshold Pulse Width: 0.4 ms
Lead Channel Sensing Intrinsic Amplitude: 2.6 mV
Lead Channel Sensing Intrinsic Amplitude: 4.5 mV
Lead Channel Setting Pacing Amplitude: 1.875
Lead Channel Setting Pacing Amplitude: 2 V
Lead Channel Setting Pacing Pulse Width: 0.4 ms
Lead Channel Setting Sensing Sensitivity: 2 mV
Pulse Gen Model: 2210
Pulse Gen Serial Number: 7192850

## 2021-01-18 NOTE — Progress Notes (Signed)
Remote pacemaker transmission.   

## 2021-01-28 ENCOUNTER — Other Ambulatory Visit: Payer: Self-pay | Admitting: Family Medicine

## 2021-02-01 ENCOUNTER — Telehealth: Payer: Self-pay | Admitting: Family Medicine

## 2021-02-01 NOTE — Telephone Encounter (Signed)
Patient is requesting refill on xanax 1 mg and levothyroxine 88 mg she states completely out lasted filled 1022/21 on xanax and 10/11/19 on levothyroxine 88 mg. Hasnt had a medication follow up since 2020. [facility],[place]

## 2021-02-01 NOTE — Telephone Encounter (Signed)
Please schedule a med check up and then route back

## 2021-02-02 NOTE — Telephone Encounter (Signed)
Patient scheduled appointment on 3/1 for med check

## 2021-02-03 MED ORDER — LEVOTHYROXINE SODIUM 88 MCG PO TABS: ORAL_TABLET | ORAL | 0 refills | Status: DC

## 2021-02-03 NOTE — Telephone Encounter (Signed)
Needs to be seen by me. Last time gave script with small amt till appt; pt didn't come in to see me.  So needs to have appt before more controlled meds filled. Dr. Lovena Le

## 2021-02-03 NOTE — Telephone Encounter (Signed)
Patient notified and verbalized understanding. 

## 2021-02-09 ENCOUNTER — Encounter: Payer: Self-pay | Admitting: Family Medicine

## 2021-02-09 ENCOUNTER — Ambulatory Visit (INDEPENDENT_AMBULATORY_CARE_PROVIDER_SITE_OTHER): Payer: 59 | Admitting: Family Medicine

## 2021-02-09 ENCOUNTER — Other Ambulatory Visit: Payer: Self-pay

## 2021-02-09 VITALS — BP 128/84 | HR 80 | Temp 93.4°F | Ht 65.0 in | Wt 226.8 lb

## 2021-02-09 DIAGNOSIS — F419 Anxiety disorder, unspecified: Secondary | ICD-10-CM | POA: Diagnosis not present

## 2021-02-09 DIAGNOSIS — E039 Hypothyroidism, unspecified: Secondary | ICD-10-CM

## 2021-02-09 DIAGNOSIS — E559 Vitamin D deficiency, unspecified: Secondary | ICD-10-CM

## 2021-02-09 MED ORDER — LEVOTHYROXINE SODIUM 88 MCG PO TABS: ORAL_TABLET | ORAL | 0 refills | Status: DC

## 2021-02-09 MED ORDER — ALPRAZOLAM 0.5 MG PO TABS: 0.5000 mg | ORAL_TABLET | Freq: Two times a day (BID) | ORAL | 0 refills | Status: DC | PRN

## 2021-02-09 NOTE — Progress Notes (Signed)
Patient ID: [name], adult    DOB: 04-Nov-1961, 60 y.o.   MRN: [id number]   Chief Complaint  Patient presents with  . Hypothyroidism   Subjective:    HPI   Pt here for medication follow up. Taking Xanax prn-not even BID. Pt no longer taking Zoloft; weaned herself off about 3 months ago. Pt states she using anti depressants for seasonal depression.   Was on zoloft, having ithcing skin, since stopping it has resolved.  weaned off 3 mo ago- zoloft Depression - is okay,  But has some anxiety due to family concerns.  Taking xanax prn.  Not taking daily.  Thyroid meds - doing well.  Patient taking medications daily. No heat or cold intolerances, dry skin, constipation, diarrhea, palpitations.  Breathing concerns- no dx of copd.  Pt is current smoker. Only using advair and albuterol prn. Still smoking.   Medical History [name] has a past medical history of Allergy, Anxiety, Arthritis, Asthma, Cataract, Depression, Esophageal stricture, GERD (gastroesophageal reflux disease), Heart defect, HLD (hyperlipidemia), Hypothyroidism, IBS (irritable bowel syndrome), IFG (impaired fasting glucose), Migraine headache, OSA (obstructive sleep apnea), Reflux, Sleep apnea, and Symptomatic bradycardia.   Outpatient Encounter Medications as of 02/09/2021  Medication Sig  . albuterol (VENTOLIN HFA) 108 (90 Base) MCG/ACT inhaler INHALE 2 PUFFS BY MOUTH EVERY 4 HOURS AS NEEDED FOR WHEEZING OR SHORTNESS OF BREATH  . fluticasone (FLONASE) 50 MCG/ACT nasal spray Place 2 sprays into both nostrils daily.  . fluticasone-salmeterol (ADVAIR HFA) 115-21 MCG/ACT inhaler Inhale 2 puffs into the lungs 2 (two) times daily.  Marland Kitchen omeprazole (PRILOSEC) 20 MG capsule Take 1 capsule (20 mg total) by mouth 2 (two) times daily.  . rosuvastatin (CRESTOR) 10 MG tablet TAKE 1 TABLET BY MOUTH ONCE DAILY FOR CHOLESTEROL  . Vitamin D, Ergocalciferol, (DRISDOL) 1.25 MG (50000 UNIT) CAPS capsule TAKE 1 CAPSULE BY MOUTH ONCE A  WEEK EVERY  7  DAYS  . [DISCONTINUED] ALPRAZolam (XANAX) 0.5 MG tablet Take 1 tablet (0.5 mg total) by mouth 2 (two) times daily as needed for anxiety. TAKE 1/2 (ONE-HALF) TABLET BY MOUTH TWICE DAILY AS NEEDED FOR ANXIETY OR  SLEEP  . [DISCONTINUED] chlorzoxazone (PARAFON FORTE DSC) 500 MG tablet Take one tablet po q 6 hrs prn. CAUTION; DROWSINESS  . [DISCONTINUED] levothyroxine (SYNTHROID) 88 MCG tablet Take one tablet po daily  . [DISCONTINUED] phentermine (ADIPEX-P) 37.5 MG tablet Take 1 tablet (37.5 mg total) by mouth daily before breakfast.  . [DISCONTINUED] sertraline (ZOLOFT) 100 MG tablet Take 1.5 tablets (150 mg total) by mouth daily.  Marland Kitchen ALPRAZolam (XANAX) 0.5 MG tablet Take 1 tablet (0.5 mg total) by mouth 2 (two) times daily as needed for anxiety. TAKE 1/2 (ONE-HALF) TABLET BY MOUTH TWICE DAILY AS NEEDED FOR ANXIETY OR  SLEEP  . levothyroxine (SYNTHROID) 88 MCG tablet Take one tablet po daily   No facility-administered encounter medications on file as of 02/09/2021.     Review of Systems  No chest pain, palpitations, sweating.  N/v/d.  No SI/HI, depression, sleep disturbance. No anxiety.  All other ros neg unless noted above.  Vitals BP 128/84   Pulse 80   Temp (!) 93.4 F (34.1 C)   Ht 5\' 5"  (1.651 m)   Wt 226 lb 12.8 oz (102.9 kg)   SpO2 97%   BMI 37.74 kg/m   Objective:   Physical Exam Vitals and nursing note reviewed.  Constitutional:      General: She is not in acute distress.  Appearance: Normal appearance.  HENT:     Head: Normocephalic and atraumatic.  Cardiovascular:     Rate and Rhythm: Normal rate and regular rhythm.     Pulses: Normal pulses.     Heart sounds: Normal heart sounds.  Pulmonary:     Effort: Pulmonary effort is normal.     Breath sounds: Normal breath sounds. No wheezing, rhonchi or rales.  Musculoskeletal:        General: Normal range of motion.     Right lower leg: No edema.     Left lower leg: No edema.  Skin:    General: Skin  is warm and dry.     Findings: No lesion or rash.  Neurological:     General: No focal deficit present.     Mental Status: She is alert and oriented to person, place, and time.     Cranial Nerves: No cranial nerve deficit.  Psychiatric:        Mood and Affect: Mood normal.        Behavior: Behavior normal.        Thought Content: Thought content normal.        Judgment: Judgment normal.      Assessment and Plan   1. Hypothyroidism, unspecified type - TSH  2. Anxiety  3. Vitamin D deficiency   Depression/anxiety-stable. Patient discontinued and weaned off Zoloft 3 months ago.  Patient taking his Xanax as needed.  Vit d low - taking 50, 000 units, once per week.  Hypothyroidism-Will order TSH. Will continue meds until TSH is done.   Return in about 6 months (around 08/12/2021) for f/u thyroid, anxiety.

## 2021-02-10 DIAGNOSIS — G5710 Meralgia paresthetica, unspecified lower limb: Secondary | ICD-10-CM | POA: Insufficient documentation

## 2021-02-10 LAB — TSH: TSH: 4.06 u[IU]/mL (ref 0.450–4.500)

## 2021-03-02 ENCOUNTER — Other Ambulatory Visit: Payer: Self-pay | Admitting: Family Medicine

## 2021-03-02 NOTE — Telephone Encounter (Signed)
Lab Results  Component Value Date   TSH 4.060 02/09/2021

## 2021-04-06 ENCOUNTER — Ambulatory Visit (INDEPENDENT_AMBULATORY_CARE_PROVIDER_SITE_OTHER): Payer: 59

## 2021-04-06 DIAGNOSIS — I442 Atrioventricular block, complete: Secondary | ICD-10-CM

## 2021-04-06 LAB — CUP PACEART REMOTE DEVICE CHECK
Battery Remaining Longevity: 31 mo
Battery Remaining Percentage: 22 %
Battery Voltage: 2.78 V
Brady Statistic AP VP Percent: 1 %
Brady Statistic AP VS Percent: 1.5 %
Brady Statistic AS VP Percent: 1 %
Brady Statistic AS VS Percent: 98 %
Brady Statistic RA Percent Paced: 1.3 %
Brady Statistic RV Percent Paced: 1 %
Date Time Interrogation Session: 20220426031335
Implantable Lead Implant Date: 20111130
Implantable Lead Implant Date: 20111130
Implantable Lead Location: 753859
Implantable Lead Location: 753860
Implantable Pulse Generator Implant Date: 20111130
Lead Channel Impedance Value: 390 Ohm
Lead Channel Impedance Value: 440 Ohm
Lead Channel Pacing Threshold Amplitude: 0.875 V
Lead Channel Pacing Threshold Amplitude: 1.625 V
Lead Channel Pacing Threshold Pulse Width: 0.4 ms
Lead Channel Pacing Threshold Pulse Width: 0.4 ms
Lead Channel Sensing Intrinsic Amplitude: 2.8 mV
Lead Channel Sensing Intrinsic Amplitude: 4.5 mV
Lead Channel Setting Pacing Amplitude: 1.875
Lead Channel Setting Pacing Amplitude: 1.875
Lead Channel Setting Pacing Pulse Width: 0.4 ms
Lead Channel Setting Sensing Sensitivity: 2 mV
Pulse Gen Model: 2210
Pulse Gen Serial Number: 7192850

## 2021-04-26 NOTE — Progress Notes (Signed)
Remote pacemaker transmission.   

## 2021-07-06 ENCOUNTER — Telehealth: Payer: Self-pay

## 2021-07-06 ENCOUNTER — Ambulatory Visit (INDEPENDENT_AMBULATORY_CARE_PROVIDER_SITE_OTHER): Payer: 59

## 2021-07-06 DIAGNOSIS — I495 Sick sinus syndrome: Secondary | ICD-10-CM

## 2021-07-06 LAB — CUP PACEART REMOTE DEVICE CHECK
Battery Remaining Longevity: 8 mo
Battery Remaining Percentage: 6 %
Battery Voltage: 2.77 V
Brady Statistic AP VP Percent: 1 %
Brady Statistic AP VS Percent: 1.3 %
Brady Statistic AS VP Percent: 1 %
Brady Statistic AS VS Percent: 98 %
Brady Statistic RA Percent Paced: 1.2 %
Brady Statistic RV Percent Paced: 1 %
Date Time Interrogation Session: 20220726054121
Implantable Lead Implant Date: 20111130
Implantable Lead Implant Date: 20111130
Implantable Lead Location: 753859
Implantable Lead Location: 753860
Implantable Pulse Generator Implant Date: 20111130
Lead Channel Impedance Value: 390 Ohm
Lead Channel Impedance Value: 400 Ohm
Lead Channel Pacing Threshold Amplitude: 0.875 V
Lead Channel Pacing Threshold Amplitude: 1.75 V
Lead Channel Pacing Threshold Pulse Width: 0.4 ms
Lead Channel Pacing Threshold Pulse Width: 0.4 ms
Lead Channel Sensing Intrinsic Amplitude: 2.8 mV
Lead Channel Sensing Intrinsic Amplitude: 4.5 mV
Lead Channel Setting Pacing Amplitude: 1.875
Lead Channel Setting Pacing Amplitude: 2 V
Lead Channel Setting Pacing Pulse Width: 0.4 ms
Lead Channel Setting Sensing Sensitivity: 2 mV
Pulse Gen Model: 2210
Pulse Gen Serial Number: 7192850

## 2021-07-06 NOTE — Telephone Encounter (Signed)
Scheduled remote reviewed. Normal device function.  Battery longevity 8 months (down significantly from estimated 2.88yr recorded at last remote 03/2021). Routing for possible monthly remote monitoring due to nearing ERI/RRT.   Successful telephone encounter to patient to inform her of battery status and that she is now scheduled for monthly remote transmission. All questions answered. Patient appreciative of call.

## 2021-07-30 ENCOUNTER — Telehealth: Payer: Self-pay | Admitting: Internal Medicine

## 2021-07-30 NOTE — Progress Notes (Signed)
Remote pacemaker transmission.   

## 2021-07-30 NOTE — Telephone Encounter (Signed)
Discussed her monthly battery checks and the next one being 8/26. Advised Dr. Tanna Furry nurse was out of the office. She will contact her next week with further advisement.

## 2021-07-30 NOTE — Telephone Encounter (Signed)
   Pt would like to know if she can schedule her procedure to change her battery this year since she already met her deductible

## 2021-08-04 NOTE — Telephone Encounter (Signed)
Returned call to Pt.  Advised that her insurance would not cover cost of battery change out if device has not hit ERI.  Will monitor closely at end of year to determine if she hits ERI before 12/11/21.

## 2021-08-06 ENCOUNTER — Ambulatory Visit (INDEPENDENT_AMBULATORY_CARE_PROVIDER_SITE_OTHER): Payer: 59

## 2021-08-06 DIAGNOSIS — I442 Atrioventricular block, complete: Secondary | ICD-10-CM

## 2021-08-06 LAB — CUP PACEART REMOTE DEVICE CHECK
Battery Remaining Longevity: 7 mo
Battery Remaining Percentage: 5 %
Battery Voltage: 2.75 V
Brady Statistic AP VP Percent: 1 %
Brady Statistic AP VS Percent: 1.3 %
Brady Statistic AS VP Percent: 1 %
Brady Statistic AS VS Percent: 99 %
Brady Statistic RA Percent Paced: 1.2 %
Brady Statistic RV Percent Paced: 1 %
Date Time Interrogation Session: 20220826080142
Implantable Lead Implant Date: 20111130
Implantable Lead Implant Date: 20111130
Implantable Lead Location: 753859
Implantable Lead Location: 753860
Implantable Pulse Generator Implant Date: 20111130
Lead Channel Impedance Value: 400 Ohm
Lead Channel Impedance Value: 440 Ohm
Lead Channel Pacing Threshold Amplitude: 0.875 V
Lead Channel Pacing Threshold Amplitude: 1.5 V
Lead Channel Pacing Threshold Pulse Width: 0.4 ms
Lead Channel Pacing Threshold Pulse Width: 0.4 ms
Lead Channel Sensing Intrinsic Amplitude: 4.7 mV
Lead Channel Sensing Intrinsic Amplitude: 5 mV
Lead Channel Setting Pacing Amplitude: 1.75 V
Lead Channel Setting Pacing Amplitude: 1.875
Lead Channel Setting Pacing Pulse Width: 0.4 ms
Lead Channel Setting Sensing Sensitivity: 2 mV
Pulse Gen Model: 2210
Pulse Gen Serial Number: 7192850

## 2021-08-18 NOTE — Progress Notes (Signed)
Remote pacemaker transmission.   

## 2021-09-06 ENCOUNTER — Ambulatory Visit (INDEPENDENT_AMBULATORY_CARE_PROVIDER_SITE_OTHER): Payer: 59

## 2021-09-06 DIAGNOSIS — I495 Sick sinus syndrome: Secondary | ICD-10-CM | POA: Diagnosis not present

## 2021-09-06 DIAGNOSIS — Z0289 Encounter for other administrative examinations: Secondary | ICD-10-CM

## 2021-09-06 LAB — CUP PACEART REMOTE DEVICE CHECK
Battery Remaining Longevity: 6 mo
Battery Remaining Percentage: 5 %
Battery Voltage: 2.74 V
Brady Statistic AP VP Percent: 1 %
Brady Statistic AP VS Percent: 1.3 %
Brady Statistic AS VP Percent: 1 %
Brady Statistic AS VS Percent: 99 %
Brady Statistic RA Percent Paced: 1.2 %
Brady Statistic RV Percent Paced: 1 %
Date Time Interrogation Session: 20220926020701
Implantable Lead Implant Date: 20111130
Implantable Lead Implant Date: 20111130
Implantable Lead Location: 753859
Implantable Lead Location: 753860
Implantable Pulse Generator Implant Date: 20111130
Lead Channel Impedance Value: 380 Ohm
Lead Channel Impedance Value: 390 Ohm
Lead Channel Pacing Threshold Amplitude: 0.875 V
Lead Channel Pacing Threshold Amplitude: 1.75 V
Lead Channel Pacing Threshold Pulse Width: 0.4 ms
Lead Channel Pacing Threshold Pulse Width: 0.4 ms
Lead Channel Sensing Intrinsic Amplitude: 2.4 mV
Lead Channel Sensing Intrinsic Amplitude: 4.1 mV
Lead Channel Setting Pacing Amplitude: 1.875
Lead Channel Setting Pacing Amplitude: 2 V
Lead Channel Setting Pacing Pulse Width: 0.4 ms
Lead Channel Setting Sensing Sensitivity: 2 mV
Pulse Gen Model: 2210
Pulse Gen Serial Number: 7192850

## 2021-09-13 NOTE — Progress Notes (Signed)
Remote pacemaker transmission.   

## 2021-10-06 ENCOUNTER — Encounter (INDEPENDENT_AMBULATORY_CARE_PROVIDER_SITE_OTHER): Payer: Self-pay | Admitting: Bariatrics

## 2021-10-06 ENCOUNTER — Ambulatory Visit (INDEPENDENT_AMBULATORY_CARE_PROVIDER_SITE_OTHER): Payer: 59 | Admitting: Bariatrics

## 2021-10-06 ENCOUNTER — Other Ambulatory Visit: Payer: Self-pay

## 2021-10-06 VITALS — BP 131/89 | HR 75 | Temp 98.0°F | Ht 65.0 in | Wt 207.0 lb

## 2021-10-06 DIAGNOSIS — R0602 Shortness of breath: Secondary | ICD-10-CM | POA: Diagnosis not present

## 2021-10-06 DIAGNOSIS — G4733 Obstructive sleep apnea (adult) (pediatric): Secondary | ICD-10-CM

## 2021-10-06 DIAGNOSIS — E038 Other specified hypothyroidism: Secondary | ICD-10-CM | POA: Diagnosis not present

## 2021-10-06 DIAGNOSIS — Z1331 Encounter for screening for depression: Secondary | ICD-10-CM

## 2021-10-06 DIAGNOSIS — Z6834 Body mass index (BMI) 34.0-34.9, adult: Secondary | ICD-10-CM

## 2021-10-06 DIAGNOSIS — Z9989 Dependence on other enabling machines and devices: Secondary | ICD-10-CM

## 2021-10-06 DIAGNOSIS — E785 Hyperlipidemia, unspecified: Secondary | ICD-10-CM

## 2021-10-06 DIAGNOSIS — E669 Obesity, unspecified: Secondary | ICD-10-CM

## 2021-10-06 DIAGNOSIS — R5383 Other fatigue: Secondary | ICD-10-CM

## 2021-10-06 DIAGNOSIS — R7303 Prediabetes: Secondary | ICD-10-CM

## 2021-10-06 DIAGNOSIS — E559 Vitamin D deficiency, unspecified: Secondary | ICD-10-CM

## 2021-10-06 NOTE — Progress Notes (Signed)
Chief Complaint:   OBESITY [name] (MR# [id number]) is a 60 y.o. female who presents for evaluation and treatment of obesity and related comorbidities. Current BMI is Body mass index is 34.45 kg/m. [name] has been struggling with her weight for many years and has been unsuccessful in either losing weight, maintaining weight loss, or reaching her healthy weight goal.  [name] states she does not like to cook usually. She considers herself to be a picky eater. Her husband is in the program.  [name] is currently in the action stage of change and ready to dedicate time achieving and maintaining a healthier weight. [name] is interested in becoming our patient and working on intensive lifestyle modifications including (but not limited to) diet and exercise for weight loss.  [name]'s habits were reviewed today and are as follows: Her family eats meals together, she thinks her family will eat healthier with her, her desired weight loss is 57 lbs, she started gaining weight in 2004-2005, her heaviest weight ever was 230 pounds, she is a picky eater and doesn't like to eat healthier foods, she skips meals frequently, she is frequently drinking liquids with calories, she frequently makes poor food choices, she frequently eats larger portions than normal, and she struggles with emotional eating.  Depression Screen [name]'s Food and Mood (modified PHQ-9) score was 13.  Depression screen PHQ 2/9 10/06/2021  Decreased Interest 2  Down, Depressed, Hopeless 2  PHQ - 2 Score 4  Altered sleeping 2  Tired, decreased energy 3  Change in appetite 1  Feeling bad or failure about yourself  1  Trouble concentrating 2  Moving slowly or fidgety/restless 0  Suicidal thoughts 0  PHQ-9 Score 13  Difficult doing work/chores Somewhat difficult   Subjective:   1. Other fatigue [name] admits to daytime somnolence and admits to waking up still tired. Patent has a history of symptoms of daytime fatigue and  morning fatigue. [name] generally gets 6 or 7 hours of sleep per night, and states that she has generally restful sleep. Snoring is present. Apneic episodes are not present. Epworth Sleepiness Score is 6.  2. SOB (shortness of breath) on exertion [name] notes increasing shortness of breath with exercising and seems to be worsening over time with weight gain. She notes getting out of breath sooner with activity than she used to. This has not gotten worse recently. [name] denies shortness of breath at rest or orthopnea.  3. Vitamin D deficiency [name] takes high dose Vit D.  4. Other specified hypothyroidism [name] is taking levothyroxine, and her levels are controlled.  5. Hyperlipidemia, unspecified hyperlipidemia type [name] is taking Crestor. Last triglycerides was 196 and HDL 47.  6. Pre-diabetes [name] is not on medications currently.  7. OSA on CPAP [name] had a home sleep study on 07/27/2021. She has been using the mask.   Assessment/Plan:   1. Other fatigue [name] does feel that her weight is causing her energy to be lower than it should be. Fatigue may be related to obesity, depression or many other causes. Labs will be ordered, and in the meanwhile, [name] will focus on self care including making healthy food choices, increasing physical activity and focusing on stress reduction.  - EKG 12-Lead  2. SOB (shortness of breath) on exertion [name] does feel that she gets out of breath more easily that she used to when she exercises. [name]'s shortness of breath appears to be obesity related and exercise induced. She has agreed to work  on weight loss and gradually increase exercise to treat her exercise induced shortness of breath. Will continue to monitor closely.  3. Vitamin D deficiency Low Vitamin D level contributes to fatigue and are associated with obesity, breast, and colon cancer. [name] will continue prescription Vitamin D 50,000 IU every week and will follow-up for routine testing of  Vitamin D, at least 2-3 times per year to avoid over-replacement.  4. Other specified hypothyroidism [name] will continue her medications. Orders and follow up as documented in patient record.  Counseling Good thyroid control is important for overall health. [name] thyroid levels are dangerous and will not improve weight loss results. Counseling: The correct way to take levothyroxine is fasting, with water, separated by at least 30 minutes from breakfast, and separated by more than 4 hours from calcium, iron, multivitamins, acid reflux medications (PPIs).   5. Hyperlipidemia, unspecified hyperlipidemia type Cardiovascular risk and specific lipid/LDL goals reviewed. We discussed several lifestyle modifications today. [name] will continue her medications, and will continue to work on diet, exercise and weight loss efforts. Orders and follow up as documented in patient record.   Counseling Intensive lifestyle modifications are the first line treatment for this issue. Dietary changes: Increase soluble fiber. Decrease simple carbohydrates. Exercise changes: Moderate to vigorous-intensity aerobic activity 150 minutes per week if tolerated. Lipid-lowering medications: see documented in medical record.  6. Pre-diabetes [name] will decrease simple carbohydrates, and increase high protein, and healthy fats to help decrease the risk of diabetes.   - Insulin, random  7. OSA on CPAP Intensive lifestyle modifications are the first line treatment for this issue. We discussed several lifestyle modifications today. [name] will consult with her primary care provider concerning her CPAP machine. She will continue to work on diet, exercise and weight loss efforts. We will continue to monitor. Orders and follow up as documented in patient record.   8. Depression screen [name] had a positive depression screening. Depression is commonly associated with obesity and often results in emotional eating  behaviors. We will monitor this closely and work on CBT to help improve the non-hunger eating patterns. Referral to Psychology may be required if no improvement is seen as she continues in our clinic.  9. Class 1 obesity with serious comorbidity and body mass index (BMI) of 34.0 to 34.9 in adult, unspecified obesity type [name] is currently in the action stage of change and her goal is to continue with weight loss efforts. I recommend [name] begin the structured treatment plan as follows:  She has agreed to the Category 1 Plan.  Mindful eating was discussed. No skipping meals and she will cut back on soda. I reviewed labs from 09/02/2021 with the patient today.  Exercise goals: Exercise DVD's.   Behavioral modification strategies: increasing lean protein intake, decreasing simple carbohydrates, increasing vegetables, increasing water intake, decreasing eating out, no skipping meals, meal planning and cooking strategies, keeping healthy foods in the home, and planning for success.  She was informed of the importance of frequent follow-up visits to maximize her success with intensive lifestyle modifications for her multiple health conditions. She was informed we would discuss her lab results at her next visit unless there is a critical issue that needs to be addressed sooner. [name] agreed to keep her next visit at the agreed upon time to discuss these results.  Objective:   Blood pressure 131/89, pulse 75, temperature 98 F (36.7 C), height 5\' 5"  (1.651 m), weight 207 lb (93.9 kg), SpO2 96 %. Body mass index  is 34.45 kg/m.  EKG: Normal sinus rhythm, rate 67 BPM.  Indirect Calorimeter completed today shows a VO2 of 212 and a REE of 1454.  Her calculated basal metabolic rate is 2800 thus her basal metabolic rate is worse than expected.  General: Cooperative, alert, well developed, in no acute distress. HEENT: Conjunctivae and lids unremarkable. Cardiovascular: Regular rhythm.  Lungs: Normal work  of breathing. Neurologic: No focal deficits.   Lab Results  Component Value Date   CREATININE 0.89 10/08/2020   BUN 15 10/08/2020   NA 142 10/08/2020   K 4.5 10/08/2020   CL 103 10/08/2020   CO2 26 10/08/2020   Lab Results  Component Value Date   ALT 22 10/08/2020   AST 16 10/08/2020   ALKPHOS 82 10/08/2020   BILITOT 0.2 10/08/2020   Lab Results  Component Value Date   HGBA1C 5.4 07/10/2020   No results found for: INSULIN Lab Results  Component Value Date   TSH 4.060 02/09/2021   Lab Results  Component Value Date   CHOL 217 (H) 10/08/2020   HDL 51 10/08/2020   LDLCALC 139 (H) 10/08/2020   TRIG 150 (H) 10/08/2020   CHOLHDL 4.3 10/08/2020   Lab Results  Component Value Date   WBC 11.2 (H) 10/08/2020   HGB 15.8 10/08/2020   HCT 47.0 (H) 10/08/2020   MCV 92 10/08/2020   PLT 455 (H) 10/08/2020   No results found for: IRON, TIBC, FERRITIN  Attestation Statements:   Reviewed by clinician on day of visit: allergies, medications, problem list, medical history, surgical history, family history, social history, and previous encounter notes.   Wilhemena Durie, am acting as Location manager for [employer], DO.  I have reviewed the above documentation for accuracy and completeness, and I agree with the above. Jearld Lesch, DO

## 2021-10-07 ENCOUNTER — Ambulatory Visit (INDEPENDENT_AMBULATORY_CARE_PROVIDER_SITE_OTHER): Payer: 59

## 2021-10-07 ENCOUNTER — Encounter (INDEPENDENT_AMBULATORY_CARE_PROVIDER_SITE_OTHER): Payer: Self-pay | Admitting: Bariatrics

## 2021-10-07 DIAGNOSIS — I442 Atrioventricular block, complete: Secondary | ICD-10-CM

## 2021-10-07 LAB — CUP PACEART REMOTE DEVICE CHECK
Battery Remaining Longevity: 6 mo
Battery Remaining Percentage: 5 %
Battery Voltage: 2.74 V
Brady Statistic AP VP Percent: 1 %
Brady Statistic AP VS Percent: 1.3 %
Brady Statistic AS VP Percent: 1 %
Brady Statistic AS VS Percent: 99 %
Brady Statistic RA Percent Paced: 1.1 %
Brady Statistic RV Percent Paced: 1 %
Date Time Interrogation Session: 20221027021731
Implantable Lead Implant Date: 20111130
Implantable Lead Implant Date: 20111130
Implantable Lead Location: 753859
Implantable Lead Location: 753860
Implantable Pulse Generator Implant Date: 20111130
Lead Channel Impedance Value: 400 Ohm
Lead Channel Impedance Value: 430 Ohm
Lead Channel Pacing Threshold Amplitude: 0.875 V
Lead Channel Pacing Threshold Amplitude: 1.625 V
Lead Channel Pacing Threshold Pulse Width: 0.4 ms
Lead Channel Pacing Threshold Pulse Width: 0.4 ms
Lead Channel Sensing Intrinsic Amplitude: 2.7 mV
Lead Channel Sensing Intrinsic Amplitude: 4.3 mV
Lead Channel Setting Pacing Amplitude: 1.875
Lead Channel Setting Pacing Amplitude: 1.875
Lead Channel Setting Pacing Pulse Width: 0.4 ms
Lead Channel Setting Sensing Sensitivity: 2 mV
Pulse Gen Model: 2210
Pulse Gen Serial Number: 7192850

## 2021-10-07 LAB — INSULIN, RANDOM: INSULIN: 6.5 u[IU]/mL (ref 2.6–24.9)

## 2021-10-15 NOTE — Progress Notes (Signed)
Remote pacemaker transmission.   

## 2021-10-17 ENCOUNTER — Other Ambulatory Visit: Payer: Self-pay | Admitting: Family Medicine

## 2021-10-17 DIAGNOSIS — J0100 Acute maxillary sinusitis, unspecified: Secondary | ICD-10-CM

## 2021-10-20 ENCOUNTER — Ambulatory Visit (INDEPENDENT_AMBULATORY_CARE_PROVIDER_SITE_OTHER): Payer: 59 | Admitting: Bariatrics

## 2021-10-20 ENCOUNTER — Encounter (INDEPENDENT_AMBULATORY_CARE_PROVIDER_SITE_OTHER): Payer: Self-pay | Admitting: Bariatrics

## 2021-10-20 ENCOUNTER — Other Ambulatory Visit: Payer: Self-pay

## 2021-10-20 VITALS — BP 122/83 | HR 67 | Temp 98.0°F | Ht 65.0 in | Wt 205.0 lb

## 2021-10-20 DIAGNOSIS — E559 Vitamin D deficiency, unspecified: Secondary | ICD-10-CM | POA: Diagnosis not present

## 2021-10-20 DIAGNOSIS — Z6834 Body mass index (BMI) 34.0-34.9, adult: Secondary | ICD-10-CM

## 2021-10-20 DIAGNOSIS — E669 Obesity, unspecified: Secondary | ICD-10-CM

## 2021-10-20 DIAGNOSIS — E785 Hyperlipidemia, unspecified: Secondary | ICD-10-CM | POA: Diagnosis not present

## 2021-10-20 NOTE — Progress Notes (Signed)
Chief Complaint:   OBESITY [name] is here to discuss her progress with her obesity treatment plan along with follow-up of her obesity related diagnoses. [name] is on the Category 1 Plan and states she is following her eating plan approximately 80% of the time. [name] states she is doing 0 minutes 0 times per week.  Today's visit was #: 3 Starting weight: 207 lbs Starting date: 10/06/2021 Today's weight: 205 lbs Today's date: 10/20/2021 Total lbs lost to date: 2 lbs Total lbs lost since last in-office visit: 2 lbs  Interim History: [name] is down 2 lbs since her initial visit. She had a death in the family. She is doing well with her water and protein.   Subjective:   1. Vitamin D insufficiency [name] is taking high dose Vitamin D. Her last Vitamin D level was 24.5.  2. Hyperlipidemia, unspecified hyperlipidemia type [name] is currently taking Crestor. Her hyperlipidemia is not at goal.  Assessment/Plan:   1. Vitamin D insufficiency Low Vitamin D level contributes to fatigue and are associated with obesity, breast, and colon cancer. [name] agrees to continue high dose prescription Vitamin D 50,000 IU every week and she will follow-up for routine testing of Vitamin D, at least 2-3 times per year to avoid over-replacement.  2. Hyperlipidemia, unspecified hyperlipidemia type Cardiovascular risk and specific lipid/LDL goals reviewed.  We discussed several lifestyle modifications today and [name] will continue to work on diet, exercise and weight loss efforts. [name] will continue Crestor. Orders and follow up as documented in patient record.   Counseling Intensive lifestyle modifications are the first line treatment for this issue. Dietary changes: Increase soluble fiber. Decrease simple carbohydrates. Exercise changes: Moderate to vigorous-intensity aerobic activity 150 minutes per week if tolerated. Lipid-lowering medications: see documented in medical record.   3. Class 1 obesity  with serious comorbidity and body mass index (BMI) of 34.0 to 34.9 in adult, unspecified obesity type [name] is currently in the action stage of change. As such, her goal is to continue with weight loss efforts. She has agreed to the Category 1 Plan.   We reviewed labs from 10/08/2020 CMP, Lipid, Vitamin D, CBC, glucose and TSH.  Exercise goals: No exercise has been prescribed at this time.  Behavioral modification strategies: increasing lean protein intake, decreasing simple carbohydrates, increasing vegetables, increasing water intake, decreasing eating out, no skipping meals, meal planning and cooking strategies, keeping healthy foods in the home, and planning for success.  [name] has agreed to follow-up with our clinic in 2 weeks. She was informed of the importance of frequent follow-up visits to maximize her success with intensive lifestyle modifications for her multiple health conditions.   Objective:   Blood pressure 122/83, pulse 67, temperature 98 F (36.7 C), height 5\' 5"  (1.651 m), weight 205 lb (93 kg), SpO2 100 %. Body mass index is 34.11 kg/m.  General: Cooperative, alert, well developed, in no acute distress. HEENT: Conjunctivae and lids unremarkable. Cardiovascular: Regular rhythm.  Lungs: Normal work of breathing. Neurologic: No focal deficits.   Lab Results  Component Value Date   CREATININE 0.89 10/08/2020   BUN 15 10/08/2020   NA 142 10/08/2020   K 4.5 10/08/2020   CL 103 10/08/2020   CO2 26 10/08/2020   Lab Results  Component Value Date   ALT 22 10/08/2020   AST 16 10/08/2020   ALKPHOS 82 10/08/2020   BILITOT 0.2 10/08/2020   Lab Results  Component Value Date   HGBA1C 5.4 07/10/2020  Lab Results  Component Value Date   INSULIN 6.5 10/06/2021   Lab Results  Component Value Date   TSH 4.060 02/09/2021   Lab Results  Component Value Date   CHOL 217 (H) 10/08/2020   HDL 51 10/08/2020   LDLCALC 139 (H) 10/08/2020   TRIG 150 (H) 10/08/2020    CHOLHDL 4.3 10/08/2020   Lab Results  Component Value Date   VD25OH 24.5 (L) 10/08/2020   VD25OH 23.1 (L) 10/07/2019   VD25OH 20.4 (L) 06/12/2018   Lab Results  Component Value Date   WBC 11.2 (H) 10/08/2020   HGB 15.8 10/08/2020   HCT 47.0 (H) 10/08/2020   MCV 92 10/08/2020   PLT 455 (H) 10/08/2020   No results found for: IRON, TIBC, FERRITIN  Attestation Statements:   Reviewed by clinician on day of visit: allergies, medications, problem list, medical history, surgical history, family history, social history, and previous encounter notes.  I, Lizbeth Bark, RMA, am acting as Location manager for [employer], DO.   I have reviewed the above documentation for accuracy and completeness, and I agree with the above. Jearld Lesch, DO

## 2021-10-25 ENCOUNTER — Encounter (INDEPENDENT_AMBULATORY_CARE_PROVIDER_SITE_OTHER): Payer: Self-pay | Admitting: Bariatrics

## 2021-10-29 ENCOUNTER — Telehealth: Payer: Self-pay | Admitting: *Deleted

## 2021-10-29 NOTE — Telephone Encounter (Signed)
Previous patient of Dr Lindwood Coke seen 02/2021- has changed providers and established at another practice.  Patient in reminder file for: Repeat CT Chest - follow up pulmonary nodule due January 2023

## 2021-10-31 ENCOUNTER — Encounter: Payer: Self-pay | Admitting: Family Medicine

## 2021-10-31 NOTE — Telephone Encounter (Signed)
A letter was dictated please send a certified

## 2021-11-08 ENCOUNTER — Ambulatory Visit (INDEPENDENT_AMBULATORY_CARE_PROVIDER_SITE_OTHER): Payer: 59

## 2021-11-08 DIAGNOSIS — I495 Sick sinus syndrome: Secondary | ICD-10-CM

## 2021-11-08 LAB — CUP PACEART REMOTE DEVICE CHECK
Battery Remaining Longevity: 6 mo
Battery Remaining Percentage: 4 %
Battery Voltage: 2.72 V
Brady Statistic AP VP Percent: 1 %
Brady Statistic AP VS Percent: 1.3 %
Brady Statistic AS VP Percent: 1 %
Brady Statistic AS VS Percent: 99 %
Brady Statistic RA Percent Paced: 1.2 %
Brady Statistic RV Percent Paced: 1 %
Date Time Interrogation Session: 20221128052820
Implantable Lead Implant Date: 20111130
Implantable Lead Implant Date: 20111130
Implantable Lead Location: 753859
Implantable Lead Location: 753860
Implantable Pulse Generator Implant Date: 20111130
Lead Channel Impedance Value: 400 Ohm
Lead Channel Impedance Value: 430 Ohm
Lead Channel Pacing Threshold Amplitude: 0.875 V
Lead Channel Pacing Threshold Amplitude: 1.75 V
Lead Channel Pacing Threshold Pulse Width: 0.4 ms
Lead Channel Pacing Threshold Pulse Width: 0.4 ms
Lead Channel Sensing Intrinsic Amplitude: 2.9 mV
Lead Channel Sensing Intrinsic Amplitude: 5.2 mV
Lead Channel Setting Pacing Amplitude: 1.875
Lead Channel Setting Pacing Amplitude: 2 V
Lead Channel Setting Pacing Pulse Width: 0.4 ms
Lead Channel Setting Sensing Sensitivity: 2 mV
Pulse Gen Model: 2210
Pulse Gen Serial Number: 7192850

## 2021-11-10 ENCOUNTER — Ambulatory Visit (INDEPENDENT_AMBULATORY_CARE_PROVIDER_SITE_OTHER): Payer: 59 | Admitting: Family Medicine

## 2021-11-10 ENCOUNTER — Encounter (INDEPENDENT_AMBULATORY_CARE_PROVIDER_SITE_OTHER): Payer: Self-pay | Admitting: Family Medicine

## 2021-11-10 ENCOUNTER — Other Ambulatory Visit: Payer: Self-pay

## 2021-11-10 VITALS — BP 120/78 | HR 63 | Temp 98.1°F | Ht 65.0 in | Wt 204.0 lb

## 2021-11-10 DIAGNOSIS — Z9189 Other specified personal risk factors, not elsewhere classified: Secondary | ICD-10-CM

## 2021-11-10 DIAGNOSIS — E669 Obesity, unspecified: Secondary | ICD-10-CM

## 2021-11-10 DIAGNOSIS — E559 Vitamin D deficiency, unspecified: Secondary | ICD-10-CM

## 2021-11-10 DIAGNOSIS — Z6834 Body mass index (BMI) 34.0-34.9, adult: Secondary | ICD-10-CM | POA: Diagnosis not present

## 2021-11-10 MED ORDER — VITAMIN D (ERGOCALCIFEROL) 1.25 MG (50000 UNIT) PO CAPS: 50000.0000 [IU] | ORAL_CAPSULE | ORAL | 0 refills | Status: AC

## 2021-11-11 NOTE — Progress Notes (Signed)
Chief Complaint:   OBESITY [name] is here to discuss her progress with her obesity treatment plan along with follow-up of her obesity related diagnoses. [name] is on the Category 1 Plan and states she is following her eating plan approximately 65% of the time. [name] states she is not currently exercising.  Today's visit was #: 3 Starting weight: 207 lbs Starting date: 10/06/2021 Today's weight: 204 lbs Today's date: 11/10/2021 Total lbs lost to date: 3 Total lbs lost since last in-office visit: 1  Interim History: November was a difficult time for pt, as her son passed 15 years ago and his birthday was [birth year]. Pt reports it is difficult to eat all foods on plan. She does not like yogurt.   Subjective:   1. Vitamin D deficiency She is currently taking prescription vitamin D 50,000 IU each week. She denies nausea, vomiting or muscle weakness.  2. At risk for malnutrition [name] is at risk for malnutrition due to current eating habits.  Assessment/Plan:  No orders of the defined types were placed in this encounter.   Medications Discontinued During This Encounter  Medication Reason   Vitamin D, Ergocalciferol, (DRISDOL) 1.25 MG (50000 UNIT) CAPS capsule Reorder     Meds ordered this encounter  Medications   Vitamin D, Ergocalciferol, (DRISDOL) 1.25 MG (50000 UNIT) CAPS capsule    Sig: Take 1 capsule (50,000 Units total) by mouth every 7 (seven) days.    Dispense:  4 capsule    Refill:  0     1. Vitamin D deficiency Low Vitamin D level contributes to fatigue and are associated with obesity, breast, and colon cancer. She agrees to continue to take prescription Vitamin D 50,000 IU every week and will follow-up for routine testing of Vitamin D, at least 2-3 times per year to avoid over-replacement.  Refill- Vitamin D, Ergocalciferol, (DRISDOL) 1.25 MG (50000 UNIT) CAPS capsule; Take 1 capsule (50,000 Units total) by mouth every 7 (seven) days.  Dispense: 4 capsule; Refill:  0  2. At risk for malnutrition [name] was given approximately >8 minutes of counseling today regarding prevention of malnutrition and ways to meet macronutrient goals..   3. Obesity with current BMI of 34  [name] is currently in the action stage of change. As such, her goal is to continue with weight loss efforts. She has agreed to the Category 1 Plan.   Exercise goals: All adults should avoid inactivity. Some physical activity is better than none, and adults who participate in any amount of physical activity gain some health benefits. Start 5 minutes of walking daily.  Behavioral modification strategies: increasing lean protein intake, decreasing simple carbohydrates, no skipping meals, and planning for success.  [name] has agreed to follow-up with our clinic in 2 weeks. She was informed of the importance of frequent follow-up visits to maximize her success with intensive lifestyle modifications for her multiple health conditions.   Objective:   Blood pressure 120/78, pulse 63, temperature 98.1 F (36.7 C), height 5\' 5"  (1.651 m), weight 204 lb (92.5 kg), SpO2 96 %. Body mass index is 33.95 kg/m.  General: Cooperative, alert, well developed, in no acute distress. HEENT: Conjunctivae and lids unremarkable. Cardiovascular: Regular rhythm.  Lungs: Normal work of breathing. Neurologic: No focal deficits.   Lab Results  Component Value Date   CREATININE 0.89 10/08/2020   BUN 15 10/08/2020   NA 142 10/08/2020   K 4.5 10/08/2020   CL 103 10/08/2020   CO2 26 10/08/2020   Lab  Results  Component Value Date   ALT 22 10/08/2020   AST 16 10/08/2020   ALKPHOS 82 10/08/2020   BILITOT 0.2 10/08/2020   Lab Results  Component Value Date   HGBA1C 5.4 07/10/2020   Lab Results  Component Value Date   INSULIN 6.5 10/06/2021   Lab Results  Component Value Date   TSH 4.060 02/09/2021   Lab Results  Component Value Date   CHOL 217 (H) 10/08/2020   HDL 51 10/08/2020   LDLCALC 139 (H)  10/08/2020   TRIG 150 (H) 10/08/2020   CHOLHDL 4.3 10/08/2020   Lab Results  Component Value Date   VD25OH 24.5 (L) 10/08/2020   VD25OH 23.1 (L) 10/07/2019   VD25OH 20.4 (L) 06/12/2018   Lab Results  Component Value Date   WBC 11.2 (H) 10/08/2020   HGB 15.8 10/08/2020   HCT 47.0 (H) 10/08/2020   MCV 92 10/08/2020   PLT 455 (H) 10/08/2020    Attestation Statements:   Reviewed by clinician on day of visit: allergies, medications, problem list, medical history, surgical history, family history, social history, and previous encounter notes.  Coral Ceo, CMA, am acting as transcriptionist for [employer], DO.  I have reviewed the above documentation for accuracy and completeness, and I agree with the above. Marjory Sneddon, D.O.  The [place] was signed into law in 2016 which includes the topic of electronic health records.  This provides immediate access to information in MyChart.  This includes consultation notes, operative notes, office notes, lab results and pathology reports.  If you have any questions about what you read please let us know at your next visit so we can discuss your concerns and take corrective action if need be.  We are right here with you.

## 2021-11-16 NOTE — Progress Notes (Signed)
Remote pacemaker transmission.   

## 2021-12-01 ENCOUNTER — Ambulatory Visit (INDEPENDENT_AMBULATORY_CARE_PROVIDER_SITE_OTHER): Payer: 59 | Admitting: Family Medicine

## 2021-12-09 ENCOUNTER — Ambulatory Visit (INDEPENDENT_AMBULATORY_CARE_PROVIDER_SITE_OTHER): Payer: 59

## 2021-12-09 DIAGNOSIS — I495 Sick sinus syndrome: Secondary | ICD-10-CM | POA: Diagnosis not present

## 2021-12-09 LAB — CUP PACEART REMOTE DEVICE CHECK
Battery Remaining Longevity: 6 mo
Battery Remaining Percentage: 4 %
Battery Voltage: 2.72 V
Brady Statistic AP VP Percent: 1 %
Brady Statistic AP VS Percent: 1.3 %
Brady Statistic AS VP Percent: 1 %
Brady Statistic AS VS Percent: 99 %
Brady Statistic RA Percent Paced: 1.2 %
Brady Statistic RV Percent Paced: 1 %
Date Time Interrogation Session: 20221229033539
Implantable Lead Implant Date: 20111130
Implantable Lead Implant Date: 20111130
Implantable Lead Location: 753859
Implantable Lead Location: 753860
Implantable Pulse Generator Implant Date: 20111130
Lead Channel Impedance Value: 360 Ohm
Lead Channel Impedance Value: 390 Ohm
Lead Channel Pacing Threshold Amplitude: 0.875 V
Lead Channel Pacing Threshold Amplitude: 2 V
Lead Channel Pacing Threshold Pulse Width: 0.4 ms
Lead Channel Pacing Threshold Pulse Width: 0.4 ms
Lead Channel Sensing Intrinsic Amplitude: 3 mV
Lead Channel Sensing Intrinsic Amplitude: 4.5 mV
Lead Channel Setting Pacing Amplitude: 1.875
Lead Channel Setting Pacing Amplitude: 2.25 V
Lead Channel Setting Pacing Pulse Width: 0.4 ms
Lead Channel Setting Sensing Sensitivity: 2 mV
Pulse Gen Model: 2210
Pulse Gen Serial Number: 7192850

## 2021-12-21 NOTE — Progress Notes (Signed)
Remote pacemaker transmission.   

## 2022-01-10 ENCOUNTER — Ambulatory Visit (INDEPENDENT_AMBULATORY_CARE_PROVIDER_SITE_OTHER): Payer: 59

## 2022-01-10 DIAGNOSIS — I495 Sick sinus syndrome: Secondary | ICD-10-CM

## 2022-01-10 LAB — CUP PACEART REMOTE DEVICE CHECK
Battery Remaining Longevity: 5 mo
Battery Remaining Percentage: 3 %
Battery Voltage: 2.71 V
Brady Statistic AP VP Percent: 1 %
Brady Statistic AP VS Percent: 1.2 %
Brady Statistic AS VP Percent: 1 %
Brady Statistic AS VS Percent: 99 %
Brady Statistic RA Percent Paced: 1.1 %
Brady Statistic RV Percent Paced: 1 %
Date Time Interrogation Session: 20230130034609
Implantable Lead Implant Date: 20111130
Implantable Lead Implant Date: 20111130
Implantable Lead Location: 753859
Implantable Lead Location: 753860
Implantable Pulse Generator Implant Date: 20111130
Lead Channel Impedance Value: 400 Ohm
Lead Channel Impedance Value: 410 Ohm
Lead Channel Pacing Threshold Amplitude: 0.75 V
Lead Channel Pacing Threshold Amplitude: 1.75 V
Lead Channel Pacing Threshold Pulse Width: 0.4 ms
Lead Channel Pacing Threshold Pulse Width: 0.4 ms
Lead Channel Sensing Intrinsic Amplitude: 2.4 mV
Lead Channel Sensing Intrinsic Amplitude: 4.9 mV
Lead Channel Setting Pacing Amplitude: 1.75 V
Lead Channel Setting Pacing Amplitude: 2 V
Lead Channel Setting Pacing Pulse Width: 0.4 ms
Lead Channel Setting Sensing Sensitivity: 2 mV
Pulse Gen Model: 2210
Pulse Gen Serial Number: 7192850

## 2022-01-18 NOTE — Progress Notes (Signed)
Remote pacemaker transmission.   

## 2022-02-10 ENCOUNTER — Ambulatory Visit (INDEPENDENT_AMBULATORY_CARE_PROVIDER_SITE_OTHER): Payer: 59

## 2022-02-10 DIAGNOSIS — I495 Sick sinus syndrome: Secondary | ICD-10-CM

## 2022-02-11 LAB — CUP PACEART REMOTE DEVICE CHECK
Battery Remaining Longevity: 4 mo
Battery Remaining Percentage: 3 %
Battery Voltage: 2.69 V
Brady Statistic AP VP Percent: 1 %
Brady Statistic AP VS Percent: 1.2 %
Brady Statistic AS VP Percent: 1 %
Brady Statistic AS VS Percent: 99 %
Brady Statistic RA Percent Paced: 1.1 %
Brady Statistic RV Percent Paced: 1 %
Date Time Interrogation Session: 20230303092505
Implantable Lead Implant Date: 20111130
Implantable Lead Implant Date: 20111130
Implantable Lead Location: 753859
Implantable Lead Location: 753860
Implantable Pulse Generator Implant Date: 20111130
Lead Channel Impedance Value: 360 Ohm
Lead Channel Impedance Value: 410 Ohm
Lead Channel Pacing Threshold Amplitude: 1 V
Lead Channel Pacing Threshold Amplitude: 1.625 V
Lead Channel Pacing Threshold Pulse Width: 0.4 ms
Lead Channel Pacing Threshold Pulse Width: 0.4 ms
Lead Channel Sensing Intrinsic Amplitude: 2.6 mV
Lead Channel Sensing Intrinsic Amplitude: 3.9 mV
Lead Channel Setting Pacing Amplitude: 1.875
Lead Channel Setting Pacing Amplitude: 2 V
Lead Channel Setting Pacing Pulse Width: 0.4 ms
Lead Channel Setting Sensing Sensitivity: 2 mV
Pulse Gen Model: 2210
Pulse Gen Serial Number: 7192850

## 2022-02-17 NOTE — Addendum Note (Signed)
Addended by: Cheri Kearns A on: 02/17/2022 09:48 AM ? ? Modules accepted: Level of Service ? ?

## 2022-02-17 NOTE — Progress Notes (Signed)
Remote pacemaker transmission.   

## 2022-03-14 ENCOUNTER — Ambulatory Visit (INDEPENDENT_AMBULATORY_CARE_PROVIDER_SITE_OTHER): Payer: 59

## 2022-03-14 DIAGNOSIS — I495 Sick sinus syndrome: Secondary | ICD-10-CM | POA: Diagnosis not present

## 2022-03-14 LAB — CUP PACEART REMOTE DEVICE CHECK
Battery Remaining Longevity: 4 mo
Battery Remaining Percentage: 2 %
Battery Voltage: 2.68 V
Brady Statistic AP VP Percent: 1 %
Brady Statistic AP VS Percent: 1.2 %
Brady Statistic AS VP Percent: 1 %
Brady Statistic AS VS Percent: 99 %
Brady Statistic RA Percent Paced: 1.1 %
Brady Statistic RV Percent Paced: 1 %
Date Time Interrogation Session: 20230403030628
Implantable Lead Implant Date: 20111130
Implantable Lead Implant Date: 20111130
Implantable Lead Location: 753859
Implantable Lead Location: 753860
Implantable Pulse Generator Implant Date: 20111130
Lead Channel Impedance Value: 390 Ohm
Lead Channel Impedance Value: 400 Ohm
Lead Channel Pacing Threshold Amplitude: 0.875 V
Lead Channel Pacing Threshold Amplitude: 1.75 V
Lead Channel Pacing Threshold Pulse Width: 0.4 ms
Lead Channel Pacing Threshold Pulse Width: 0.4 ms
Lead Channel Sensing Intrinsic Amplitude: 3.8 mV
Lead Channel Sensing Intrinsic Amplitude: 4.7 mV
Lead Channel Setting Pacing Amplitude: 1.875
Lead Channel Setting Pacing Amplitude: 2 V
Lead Channel Setting Pacing Pulse Width: 0.4 ms
Lead Channel Setting Sensing Sensitivity: 2 mV
Pulse Gen Model: 2210
Pulse Gen Serial Number: 7192850

## 2022-03-25 NOTE — Progress Notes (Signed)
Remote pacemaker transmission.   

## 2022-04-14 ENCOUNTER — Ambulatory Visit (INDEPENDENT_AMBULATORY_CARE_PROVIDER_SITE_OTHER): Payer: 59

## 2022-04-14 DIAGNOSIS — I495 Sick sinus syndrome: Secondary | ICD-10-CM

## 2022-04-14 LAB — CUP PACEART REMOTE DEVICE CHECK
Battery Remaining Longevity: 3 mo
Battery Remaining Percentage: 2 %
Battery Voltage: 2.66 V
Brady Statistic AP VP Percent: 1 %
Brady Statistic AP VS Percent: 1.2 %
Brady Statistic AS VP Percent: 1 %
Brady Statistic AS VS Percent: 99 %
Brady Statistic RA Percent Paced: 1.1 %
Brady Statistic RV Percent Paced: 1 %
Date Time Interrogation Session: 20230504102616
Implantable Lead Implant Date: 20111130
Implantable Lead Implant Date: 20111130
Implantable Lead Location: 753859
Implantable Lead Location: 753860
Implantable Pulse Generator Implant Date: 20111130
Lead Channel Impedance Value: 400 Ohm
Lead Channel Impedance Value: 410 Ohm
Lead Channel Pacing Threshold Amplitude: 0.875 V
Lead Channel Pacing Threshold Amplitude: 1.875 V
Lead Channel Pacing Threshold Pulse Width: 0.4 ms
Lead Channel Pacing Threshold Pulse Width: 0.4 ms
Lead Channel Sensing Intrinsic Amplitude: 2.4 mV
Lead Channel Sensing Intrinsic Amplitude: 4.6 mV
Lead Channel Setting Pacing Amplitude: 1.875
Lead Channel Setting Pacing Amplitude: 2.125
Lead Channel Setting Pacing Pulse Width: 0.4 ms
Lead Channel Setting Sensing Sensitivity: 2 mV
Pulse Gen Model: 2210
Pulse Gen Serial Number: 7192850

## 2022-04-27 NOTE — Progress Notes (Signed)
Remote pacemaker transmission.   

## 2022-05-16 ENCOUNTER — Ambulatory Visit (INDEPENDENT_AMBULATORY_CARE_PROVIDER_SITE_OTHER): Payer: 59

## 2022-05-16 DIAGNOSIS — I495 Sick sinus syndrome: Secondary | ICD-10-CM

## 2022-05-17 LAB — CUP PACEART REMOTE DEVICE CHECK
Battery Remaining Longevity: 3 mo
Battery Remaining Percentage: 2 %
Battery Voltage: 2.66 V
Brady Statistic AP VP Percent: 1 %
Brady Statistic AP VS Percent: 1.1 %
Brady Statistic AS VP Percent: 1 %
Brady Statistic AS VS Percent: 99 %
Brady Statistic RA Percent Paced: 1 %
Brady Statistic RV Percent Paced: 1 %
Date Time Interrogation Session: 20230606111107
Implantable Lead Implant Date: 20111130
Implantable Lead Implant Date: 20111130
Implantable Lead Location: 753859
Implantable Lead Location: 753860
Implantable Pulse Generator Implant Date: 20111130
Lead Channel Impedance Value: 360 Ohm
Lead Channel Impedance Value: 390 Ohm
Lead Channel Pacing Threshold Amplitude: 0.875 V
Lead Channel Pacing Threshold Amplitude: 1.75 V
Lead Channel Pacing Threshold Pulse Width: 0.4 ms
Lead Channel Pacing Threshold Pulse Width: 0.4 ms
Lead Channel Sensing Intrinsic Amplitude: 3.6 mV
Lead Channel Sensing Intrinsic Amplitude: 4.1 mV
Lead Channel Setting Pacing Amplitude: 1.875
Lead Channel Setting Pacing Amplitude: 2 V
Lead Channel Setting Pacing Pulse Width: 0.4 ms
Lead Channel Setting Sensing Sensitivity: 2 mV
Pulse Gen Model: 2210
Pulse Gen Serial Number: 7192850

## 2022-06-01 NOTE — Addendum Note (Signed)
Addended by: Cheri Kearns A on: 06/01/2022 02:04 PM   Modules accepted: Level of Service

## 2022-06-01 NOTE — Progress Notes (Signed)
Remote pacemaker transmission.   

## 2022-06-15 ENCOUNTER — Ambulatory Visit (INDEPENDENT_AMBULATORY_CARE_PROVIDER_SITE_OTHER): Payer: 59 | Admitting: Internal Medicine

## 2022-06-15 ENCOUNTER — Encounter: Payer: Self-pay | Admitting: Internal Medicine

## 2022-06-15 VITALS — BP 122/80 | HR 75 | Ht 65.0 in | Wt 201.8 lb

## 2022-06-15 DIAGNOSIS — F17200 Nicotine dependence, unspecified, uncomplicated: Secondary | ICD-10-CM

## 2022-06-15 DIAGNOSIS — F17210 Nicotine dependence, cigarettes, uncomplicated: Secondary | ICD-10-CM

## 2022-06-15 DIAGNOSIS — J449 Chronic obstructive pulmonary disease, unspecified: Secondary | ICD-10-CM | POA: Diagnosis not present

## 2022-06-15 MED ORDER — SPIRIVA RESPIMAT 2.5 MCG/ACT IN AERS: 2.0000 | INHALATION_SPRAY | Freq: Every day | RESPIRATORY_TRACT | 5 refills | Status: DC

## 2022-06-15 MED ORDER — ALBUTEROL SULFATE HFA 108 (90 BASE) MCG/ACT IN AERS: INHALATION_SPRAY | RESPIRATORY_TRACT | 5 refills | Status: DC

## 2022-06-15 NOTE — Progress Notes (Signed)
[name]    [id number]    [birth year]  Primary Care Physician:Dinsbeer, Ander Purpura, [place]  Referring Physician: Teressa Senter, [address],  [place] [id number] Reason for Consultation: copd  Date of Consultation: 06/15/2022  Chief complaint:   Chief Complaint  Patient presents with   Consult    PT her eto consult on COPD, seen Loanne Drilling in 2020. Pt does complain of SOB on exertion, and also coughing     HPI: [name] is a 61 y.o. woman with tobacco use disorder who presents for new patient evaluation.  Saw Dr. Loanne Drilling over 3 years ago once. Her husband is also my patient.  Here with worsening shortness of breath on exertion. Short of breath with cleaning the house.    She has been on inhalers in the past but hasnt felt they helped. She was on DPI in the past.   Denies recurrent pneumonia or bronchitis.   Does not even have rescue inhaler like albuterol.   She does have OSA and did try CPAP and was not able to tolerate it.     Social history:  Occupation: works in Scientist, research (physical sciences)  Exposures: lives at home with husband, keeps grandchildren regularly.  Smoking history: 2 ppd x 40 years Has started wellbutrin and cut back to 1/2 ppd.   Social History   Occupational History   Occupation: retired    Fish farm manager: [employer]   Occupation: Nana, Housewife  Tobacco Use   Smoking status: Every Day    Packs/day: 2.00    Years: 40.00    Total pack years: 80.00    Types: Cigarettes   Smokeless tobacco: Never   Tobacco comments:    tobacco info givin 03/08/16, smokes half pack per day 06/15/22 [facility]  Vaping Use   Vaping Use: Former  Substance and Sexual Activity   Alcohol use: No    Alcohol/week: 0.0 standard drinks of alcohol    Comment: Occ.    Drug use: No   Sexual activity: Not on file    Relevant family history:  Family History  Problem Relation Age of Onset   Arthritis Mother    Heart disease Mother        coronary;  female < 76   Asthma Mother    High Cholesterol Mother    Thyroid disease Mother    Depression Mother    Bipolar disorder Mother    Throat cancer Father        squamous cell carcinoma   Tongue cancer Father    Esophageal cancer Father    Depression Father    Colon cancer Neg Hx    Rectal cancer Neg Hx    Stomach cancer Neg Hx     Past Medical History:  Diagnosis Date   Allergy    Anxiety    Arthritis    Asthma    Cataract    Depression    bipolar disorder   Emphysema, unspecified ([place])    Esophageal stricture    GERD (gastroesophageal reflux disease)    Heart defect    HLD (hyperlipidemia)    Hypothyroidism    IBS (irritable bowel syndrome)    IFG (impaired fasting glucose)    Migraine headache    Obesity    OSA (obstructive sleep apnea)    Pacemaker    Prediabetes    Reflux    Sleep apnea    SOB (shortness of breath)  Symptomatic bradycardia     Past Surgical History:  Procedure Laterality Date   ABDOMINAL HERNIA REPAIR     BREAST CYST EXCISION     left   Cesarean sections     x2   CHOLECYSTECTOMY     COLONOSCOPY     dental implants     foor surgery Right    screws    FOOT SURGERY     screws in left foot   PACEMAKER INSERTION     TUBAL LIGATION     VAGINAL HYSTERECTOMY       PhysicalExam: Blood pressure 122/80, pulse 75, height '5\' 5"'$  (1.651 m), weight 201 lb 12.8 oz (91.5 kg), SpO2 95 %. Gen:      No acute distress ENT:  no nasal polyps, mucus membranes moist Lungs:    No increased respiratory effort, symmetric chest wall excursion, clear to auscultation bilaterally, no wheezes or crackles CV:         Regular rate and rhythm; no murmurs, rubs, or gallops.  No pedal edema Abd:      + bowel sounds; soft, non-tender; no distension MSK: no acute synovitis of DIP or PIP joints, no mechanics hands.  Skin:      Warm and dry; no rashes Neuro: normal speech, no focal facial asymmetry Psych: alert and oriented x3, normal mood and affect   Data  Reviewed/Medical Decision Making:  Independent interpretation of tests: Imaging:  Review of patient's CT Chest Jan 2022 shows sub 4 mm pulmonary nodules and emphysemaThe patient's images have been independently reviewed by me.    PFTs:   Labs:  Lab Results  Component Value Date   WBC 11.2 (H) 10/08/2020   HGB 15.8 10/08/2020   HCT 47.0 (H) 10/08/2020   MCV 92 10/08/2020   PLT 455 (H) 10/08/2020   Lab Results  Component Value Date   NA 142 10/08/2020   K 4.5 10/08/2020   CL 103 10/08/2020   CO2 26 10/08/2020      Immunization status:  Immunization History  Administered Date(s) Administered   Influenza Split 09/04/2019   Influenza, Quadrivalent, Recombinant, Inj, Pf 08/25/2019   Influenza,inj,Quad PF,6+ Mos 12/04/2015, 10/17/2017, 09/26/2018   Influenza-Unspecified 09/12/2011, 09/11/2019   Moderna Sars-Covid-2 Vaccination 03/16/2020, 04/13/2020   Zoster Recombinat (Shingrix) 08/25/2019, 09/11/2019     I reviewed prior external note(s) from pulmonary, ED, pcp  I reviewed the result(s) of the labs and imaging as noted above.   I have ordered overnight oximetry, pft  Assessment:  COPD with progression Tobacco use disorder Untreated OSA   Plan/Recommendations:  Continue wellbutrin for smoking cessation. New start spiriva inhaler 2 puffs once daily.  New start albuterol inhaler 2 puffs every 4 hours as needed.  Will obtain overnight oximetry for OSA since she doesn't want to wear CPAP Will obtain pfts for copd to ascertain disease severity. Will refer for lung cancer screening program.  We discussed disease management and progression at length today.   Smoking Cessation Counseling:  1. The patient is an everyday smoker and symptomatic due to the following condition copd 2. The patient is currently contemplative in quitting smoking. 3. I advised patient to quit smoking. 4. We identified patient specific barriers to change.  5. I personally spent 3' \\minutes'$   counseling the patient regarding tobacco use disorder. 6. We discussed management of stress and anxiety to help with smoking cessation, when applicable. 7. We discussed nicotine replacement therapy, Wellbutrin, Chantix as possible options. 8. I advised setting a quit date.  9. Follow?up arranged with our office to continue ongoing discussions. 10.Resources given to patient including quit hotline.     Return to Care: Return in about 2 months (around 08/16/2022).  Lenice Llamas, MD Pulmonary and [place]  CC: Teressa Senter, FNP

## 2022-06-15 NOTE — Patient Instructions (Addendum)
Please schedule follow up scheduled with myself in 2 months.  If my schedule is not open yet, we will contact you with a reminder closer to that time. Please call [phone] if you haven't heard from [place] a month before.   Before your next visit I would like you to have: Full set of PFTs before next visit  I will refer you for lung cancer screening program.   I am also ordering an overnight oximetry test for your sleep.   Start spiriva inhaler 2 puffs once a day in morning.  Take the albuterol rescue inhaler every 4 to 6 hours as needed for wheezing or shortness of breath. You can also take it 15 minutes before exercise or exertional activity. Side effects include heart racing or pounding, jitters or anxiety. If you have a history of an irregular heart rhythm, it can make this worse. Can also give some patients a hard time sleeping.  What are the benefits of quitting smoking? Quitting smoking can lower your chances of getting or dying from heart disease, lung disease, kidney failure, infection, or cancer. It can also lower your chances of getting osteoporosis, a condition that makes your bones weak. Plus, quitting smoking can help your skin look younger and reduce the chances that you will have problems with sex.  Quitting smoking will improve your health no matter how old you are, and no matter how long or how much you have smoked.  What should I do if I want to quit smoking? The letters in the word "START" can help you remember the steps to take: S = Set a quit date. T = Tell family, friends, and the people around you that you plan to quit. A = Anticipate or plan ahead for the tough times you'll face while quitting. R = Remove cigarettes and other tobacco products from your home, car, and work. T = Talk to your doctor about getting help to quit.  How can my doctor or nurse help? Your doctor or nurse can give you advice on the best way to quit. He or she can also put you in touch with  counselors or other people you can call for support. Plus, your doctor or nurse can give you medicines to: ?Reduce your craving for cigarettes ?Reduce the unpleasant symptoms that happen when you stop smoking (called "withdrawal symptoms"). You can also get help from a free phone line (1-800-QUIT-NOW) or go online to [facility].  What are the symptoms of withdrawal? The symptoms include: ?Trouble sleeping ?Being irritable, anxious or restless ?Getting frustrated or angry ?Having trouble thinking clearly  Some people who stop smoking become temporarily depressed. Some people need treatment for depression, such as counseling or antidepressant medicines. Depressed people might: ?No longer enjoy or care about doing the things they used to like to do ?Feel sad, down, hopeless, nervous, or cranky most of the day, almost every day ?Lose or gain weight ?Sleep too much or too little ?Feel tired or like they have no energy ?Feel guilty or like they are worth nothing ?Forget things or feel confused ?Move and speak more slowly than usual ?Act restless or have trouble staying still ?Think about death or suicide  If you think you might be depressed, see your doctor or nurse. Only someone trained in mental health can tell for sure if you are depressed. If you ever feel like you might hurt yourself, go straight to the nearest emergency department. Or you can call for an ambulance (in the  [place] and [place], dial 9-1-1) or call your doctor or nurse right away and tell them it is an emergency. You can also reach the [place] National Suicide Prevention Lifeline at 367-772-9767 or http://walker-sanchez.info/.  How do medicines help you stop smoking? Different medicines work in different ways: ?Nicotine replacement therapy eases withdrawal and reduces your body's craving for nicotine, the main drug found in cigarettes. There are different forms of nicotine replacement, including skin patches, lozenges,  gum, nasal sprays, and "puffers" or inhalers. Many can be bought without a prescription, while others might require one. ?Bupropion is a prescription medicine that reduces your desire to smoke. This medicine is sold under the brand names Zyban and Wellbutrin. It is also available in a generic version, which is cheaper than brand name medicines. ?Varenicline (brand names: Chantix, Champix) is a prescription medicine that reduces withdrawal symptoms and cigarette cravings. If you think you'd like to take varenicline and you have a history of depression, anxiety, or heart disease, discuss this with your doctor or nurse before taking the medicine. Varenicline can also increase the effects of alcohol in some people. It's a good idea to limit drinking while you're taking it, at least until you know how it affects you.  How does counseling work? Counseling can happen during formal office visits or just over the phone. A counselor can help you: ?Figure out what triggers your smoking and what to do instead ?Overcome cravings ?Figure out what went wrong when you tried to quit before  What works best? Studies show that people have the best luck at quitting if they take medicines to help them quit and work with a Social worker. It might also be helpful to combine nicotine replacement with one of the prescription medicines that help people quit. In some cases, it might even make sense to take bupropion and varenicline together.  What about e-cigarettes? Sometimes people wonder if using electronic cigarettes, or "e-cigarettes," might help them quit smoking. Using e-cigarettes is also called "vaping." Doctors do not recommend e-cigarettes in place of medicines and counseling. That's because e-cigarettes still contain nicotine as well as other substances that might be harmful. It's not clear how they can affect a person's health in the long term.  Will I gain weight if I quit? Yes, you might gain a few pounds. But  quitting smoking will have a much more positive effect on your health than weighing a few pounds more. Plus, you can help prevent some weight gain by being more active and eating less. Taking the medicine bupropion might help control weight gain.   What else can I do to improve my chances of quitting? You can: ?Start exercising. ?Stay away from smokers and places that you associate with smoking. If people close to you smoke, ask them to quit with you. ?Keep gum, hard candy, or something to put in your mouth handy. If you get a craving for a cigarette, try one of these instead. ?Don't give up, even if you start smoking again. It takes most people a few tries before they succeed.  What if I am pregnant and I smoke? If you are pregnant, it's really important for the health of your baby that you quit. Ask your doctor what options you have, and what is safest for your baby

## 2022-06-15 NOTE — Progress Notes (Signed)
The patient has been prescribed the inhaler albuterol, spiriva. Inhaler technique was demonstrated to patient. The patient subsequently demonstrated correct technique.

## 2022-06-17 ENCOUNTER — Ambulatory Visit (INDEPENDENT_AMBULATORY_CARE_PROVIDER_SITE_OTHER): Payer: 59

## 2022-06-17 DIAGNOSIS — I495 Sick sinus syndrome: Secondary | ICD-10-CM

## 2022-06-18 LAB — CUP PACEART REMOTE DEVICE CHECK
Battery Remaining Longevity: 1 mo
Battery Remaining Percentage: 1 %
Battery Voltage: 2.63 V
Brady Statistic AP VP Percent: 1 %
Brady Statistic AP VS Percent: 1.1 %
Brady Statistic AS VP Percent: 1 %
Brady Statistic AS VS Percent: 99 %
Brady Statistic RA Percent Paced: 1 %
Brady Statistic RV Percent Paced: 1 %
Date Time Interrogation Session: 20230707034107
Implantable Lead Implant Date: 20111130
Implantable Lead Implant Date: 20111130
Implantable Lead Location: 753859
Implantable Lead Location: 753860
Implantable Pulse Generator Implant Date: 20111130
Lead Channel Impedance Value: 400 Ohm
Lead Channel Impedance Value: 410 Ohm
Lead Channel Pacing Threshold Amplitude: 0.75 V
Lead Channel Pacing Threshold Amplitude: 1.875 V
Lead Channel Pacing Threshold Pulse Width: 0.4 ms
Lead Channel Pacing Threshold Pulse Width: 0.4 ms
Lead Channel Sensing Intrinsic Amplitude: 2.9 mV
Lead Channel Sensing Intrinsic Amplitude: 4.7 mV
Lead Channel Setting Pacing Amplitude: 1.75 V
Lead Channel Setting Pacing Amplitude: 2.125
Lead Channel Setting Pacing Pulse Width: 0.4 ms
Lead Channel Setting Sensing Sensitivity: 2 mV
Pulse Gen Model: 2210
Pulse Gen Serial Number: 7192850

## 2022-07-04 NOTE — Progress Notes (Signed)
Remote pacemaker transmission.   

## 2022-07-04 NOTE — Addendum Note (Signed)
Addended by: Cheri Kearns A on: 07/04/2022 08:52 AM   Modules accepted: Level of Service

## 2022-07-18 ENCOUNTER — Ambulatory Visit (INDEPENDENT_AMBULATORY_CARE_PROVIDER_SITE_OTHER): Payer: 59

## 2022-07-18 DIAGNOSIS — I495 Sick sinus syndrome: Secondary | ICD-10-CM

## 2022-07-19 LAB — CUP PACEART REMOTE DEVICE CHECK
Battery Remaining Longevity: 1 mo
Battery Remaining Percentage: 0.5 %
Battery Voltage: 2.62 V
Brady Statistic AP VP Percent: 1 %
Brady Statistic AP VS Percent: 1.1 %
Brady Statistic AS VP Percent: 1 %
Brady Statistic AS VS Percent: 99 %
Brady Statistic RA Percent Paced: 1 %
Brady Statistic RV Percent Paced: 1 %
Date Time Interrogation Session: 20230807035133
Implantable Lead Implant Date: 20111130
Implantable Lead Implant Date: 20111130
Implantable Lead Location: 753859
Implantable Lead Location: 753860
Implantable Pulse Generator Implant Date: 20111130
Lead Channel Impedance Value: 390 Ohm
Lead Channel Impedance Value: 400 Ohm
Lead Channel Pacing Threshold Amplitude: 1 V
Lead Channel Pacing Threshold Amplitude: 1.875 V
Lead Channel Pacing Threshold Pulse Width: 0.4 ms
Lead Channel Pacing Threshold Pulse Width: 0.4 ms
Lead Channel Sensing Intrinsic Amplitude: 2.5 mV
Lead Channel Sensing Intrinsic Amplitude: 4.3 mV
Lead Channel Setting Pacing Amplitude: 2 V
Lead Channel Setting Pacing Amplitude: 2.125
Lead Channel Setting Pacing Pulse Width: 0.4 ms
Lead Channel Setting Sensing Sensitivity: 2 mV
Pulse Gen Model: 2210
Pulse Gen Serial Number: 7192850

## 2022-07-20 ENCOUNTER — Encounter (INDEPENDENT_AMBULATORY_CARE_PROVIDER_SITE_OTHER): Payer: Self-pay

## 2022-08-03 ENCOUNTER — Other Ambulatory Visit: Payer: Self-pay

## 2022-08-03 DIAGNOSIS — Z87891 Personal history of nicotine dependence: Secondary | ICD-10-CM

## 2022-08-03 DIAGNOSIS — Z122 Encounter for screening for malignant neoplasm of respiratory organs: Secondary | ICD-10-CM

## 2022-08-10 ENCOUNTER — Ambulatory Visit (INDEPENDENT_AMBULATORY_CARE_PROVIDER_SITE_OTHER): Payer: 59 | Admitting: Acute Care

## 2022-08-10 ENCOUNTER — Encounter: Payer: Self-pay | Admitting: Acute Care

## 2022-08-10 DIAGNOSIS — Z87891 Personal history of nicotine dependence: Secondary | ICD-10-CM | POA: Diagnosis not present

## 2022-08-10 NOTE — Patient Instructions (Signed)
Thank you for participating in the [facility]. It was our pleasure to meet you today. We will call you with the results of your scan within the next few days. Your scan will be assigned a Lung RADS category score by the physicians reading the scans.  This Lung RADS score determines follow up scanning.  See below for description of categories, and follow up screening recommendations. We will be in touch to schedule your follow up screening annually or based on recommendations of our providers. We will fax a copy of your scan results to your Primary Care Physician, or the physician who referred you to the program, to ensure they have the results. Please call the office if you have any questions or concerns regarding your scanning experience or results.  Our office number is [phone]. Please speak with Denise Phelps, RN. , or  Denise Buckner RN, They are  our Lung Cancer Screening RN.'s If They are unavailable when you call, Please leave a message on the voice mail. We will return your call at our earliest convenience.This voice mail is monitored several times a day.  Remember, if your scan is normal, we will scan you annually as long as you continue to meet the criteria for the program. (Age 55-77, Current smoker or smoker who has quit within the last 15 years). If you are a smoker, remember, quitting is the single most powerful action that you can take to decrease your risk of lung cancer and other pulmonary, breathing related problems. We know quitting is hard, and we are here to help.  Please let us know if there is anything we can do to help you meet your goal of quitting. If you are a former smoker, congratulations. We are proud of you! Remain smoke free! Remember you can refer friends or family members through the number above.  We will screen them to make sure they meet criteria for the program. Thank you for helping us take better care of you by  participating in Lung Screening.  You can receive free nicotine replacement therapy ( patches, gum or mints) by calling 1-800-QUIT NOW. Please call so we can get you on the path to becoming  a non-smoker. I know it is hard, but you can do this!  Lung RADS Categories:  Lung RADS 1: no nodules or definitely non-concerning nodules.  Recommendation is for a repeat annual scan in 12 months.  Lung RADS 2:  nodules that are non-concerning in appearance and behavior with a very low likelihood of becoming an active cancer. Recommendation is for a repeat annual scan in 12 months.  Lung RADS 3: nodules that are probably non-concerning , includes nodules with a low likelihood of becoming an active cancer.  Recommendation is for a 6-month repeat screening scan. Often noted after an upper respiratory illness. We will be in touch to make sure you have no questions, and to schedule your 6-month scan.  Lung RADS 4 A: nodules with concerning findings, recommendation is most often for a follow up scan in 3 months or additional testing based on our provider's assessment of the scan. We will be in touch to make sure you have no questions and to schedule the recommended 3 month follow up scan.  Lung RADS 4 B:  indicates findings that are concerning. We will be in touch with you to schedule additional diagnostic testing based on our provider's  assessment of the scan.  Other options for assistance in smoking cessation (   As covered by your insurance benefits)  Hypnosis for smoking cessation  [employer] [phone]  Acupuncture for smoking cessation  [facility] [phone]   

## 2022-08-10 NOTE — Progress Notes (Signed)
Virtual Visit via Telephone Note  I connected with [name] on 10/26/21 at  2:00 PM EST by telephone and verified that I am speaking with the correct person using two identifiers.  Location: Patient: Home Provider: Working from home   I discussed the limitations, risks, security and privacy concerns of performing an evaluation and management service by telephone and the availability of in person appointments. I also discussed with the patient that there may be a patient responsible charge related to this service. The patient expressed understanding and agreed to proceed.  Shared Decision Making Visit Lung Cancer Screening Program [phone])   Eligibility: Age 61 y.o. Pack Years Smoking History Calculation 60 (# packs/per year x # years smoked) Recent History of coughing up blood  no Unexplained weight loss? no ( >Than 15 pounds within the last 6 months ) Prior History Lung / other cancer no (Diagnosis within the last 5 years already requiring surveillance chest CT Scans). Smoking Status Former Smoker Former Smokers: Years since quit: < 1 year  Quit Date: 07/08/2022  Visit Components: Discussion included one or more decision making aids. yes Discussion included risk/benefits of screening. yes Discussion included potential follow up diagnostic testing for abnormal scans. yes Discussion included meaning and risk of over diagnosis. yes Discussion included meaning and risk of False Positives. yes Discussion included meaning of total radiation exposure. yes  Counseling Included: Importance of adherence to annual lung cancer LDCT screening. yes Impact of comorbidities on ability to participate in the program. yes Ability and willingness to under diagnostic treatment. yes  Smoking Cessation Counseling: Current Smokers:  Discussed importance of smoking cessation. yes Information about tobacco cessation classes and interventions provided to patient. yes Patient provided with  "ticket" for LDCT Scan. yes Symptomatic Patient. no  Counseling NA Diagnosis Code: Tobacco Use Z72.0 Asymptomatic Patient yes  Counseling NA Former Smokers:  Discussed the importance of maintaining cigarette abstinence. yes Diagnosis Code: Personal History of Nicotine Dependence. H29.924 Information about tobacco cessation classes and interventions provided to patient. Yes Patient provided with "ticket" for LDCT Scan. yes Written Order for Lung Cancer Screening with LDCT placed in Epic. Yes (CT Chest Lung Cancer Screening Low Dose W/O CM) QAS3419 Z12.2-Screening of respiratory organs Z87.891-Personal history of nicotine dependence   I spent 25 minutes of face to face time with [name] discussing the risks and benefits of lung cancer screening. We viewed a power point together that explained in detail the above noted topics. We took the time to pause the power point at intervals to allow for questions to be asked and answered to ensure understanding. We discussed that she had taken the single most powerful action possible to decrease [name] risk of developing lung cancer when she quit smoking. I counseled [name] to remain smoke free, and to contact me if she ever had the desire to smoke again so that I can provide resources and tools to help support the effort to remain smoke free. We discussed the time and location of the scan, and that either  Doroteo Glassman RN or I will call with the results within  24-48 hours of receiving them. She has my card and contact information in the event she needs to speak with me, in addition to a copy of the power point we reviewed as a resource. She verbalized understanding of all of the above and had no further questions upon leaving the office.     I explained to the patient that there has been a high incidence  of coronary artery disease noted on these exams. I explained that this is a non-gated exam therefore degree or severity cannot be determined. This patient is on  statin therapy. I have asked the patient to follow-up with their PCP regarding any incidental finding of coronary artery disease and management with diet or medication as they feel is clinically indicated. The patient verbalized understanding of the above and had no further questions.     Lorrene Graef D. Kenton Kingfisher, NP-C [facility] Personal contact information can be found on Amion  08/10/2022, 9:11 AM

## 2022-08-17 ENCOUNTER — Encounter: Payer: Self-pay | Admitting: Internal Medicine

## 2022-08-17 ENCOUNTER — Ambulatory Visit (INDEPENDENT_AMBULATORY_CARE_PROVIDER_SITE_OTHER): Payer: 59

## 2022-08-17 ENCOUNTER — Ambulatory Visit (INDEPENDENT_AMBULATORY_CARE_PROVIDER_SITE_OTHER): Payer: 59 | Admitting: Internal Medicine

## 2022-08-17 VITALS — BP 126/82 | HR 65 | Ht 65.0 in | Wt 215.0 lb

## 2022-08-17 DIAGNOSIS — J410 Simple chronic bronchitis: Secondary | ICD-10-CM

## 2022-08-17 DIAGNOSIS — G4733 Obstructive sleep apnea (adult) (pediatric): Secondary | ICD-10-CM

## 2022-08-17 DIAGNOSIS — Z122 Encounter for screening for malignant neoplasm of respiratory organs: Secondary | ICD-10-CM | POA: Diagnosis not present

## 2022-08-17 DIAGNOSIS — F17200 Nicotine dependence, unspecified, uncomplicated: Secondary | ICD-10-CM | POA: Diagnosis not present

## 2022-08-17 DIAGNOSIS — J449 Chronic obstructive pulmonary disease, unspecified: Secondary | ICD-10-CM | POA: Diagnosis not present

## 2022-08-17 DIAGNOSIS — Z87891 Personal history of nicotine dependence: Secondary | ICD-10-CM

## 2022-08-17 LAB — PULMONARY FUNCTION TEST
DL/VA % pred: 116 %
DL/VA: 4.88 ml/min/mmHg/L
DLCO cor % pred: 119 %
DLCO cor: 25.19 ml/min/mmHg
DLCO unc % pred: 119 %
DLCO unc: 25.19 ml/min/mmHg
FEF 25-75 Post: 2.64 L/sec
FEF 25-75 Pre: 2.61 L/sec
FEF2575-%Change-Post: 1 %
FEF2575-%Pred-Post: 107 %
FEF2575-%Pred-Pre: 106 %
FEV1-%Change-Post: 0 %
FEV1-%Pred-Post: 92 %
FEV1-%Pred-Pre: 91 %
FEV1-Post: 2.48 L
FEV1-Pre: 2.46 L
FEV1FVC-%Change-Post: -3 %
FEV1FVC-%Pred-Pre: 104 %
FEV6-%Change-Post: 4 %
FEV6-%Pred-Post: 94 %
FEV6-%Pred-Pre: 89 %
FEV6-Post: 3.14 L
FEV6-Pre: 2.99 L
FEV6FVC-%Pred-Post: 103 %
FEV6FVC-%Pred-Pre: 103 %
FVC-%Change-Post: 4 %
FVC-%Pred-Post: 91 %
FVC-%Pred-Pre: 87 %
FVC-Post: 3.16 L
FVC-Pre: 3.01 L
Post FEV1/FVC ratio: 79 %
Post FEV6/FVC ratio: 100 %
Pre FEV1/FVC ratio: 82 %
Pre FEV6/FVC Ratio: 100 %
RV % pred: 121 %
RV: 2.46 L
TLC % pred: 106 %
TLC: 5.56 L

## 2022-08-17 NOTE — Patient Instructions (Addendum)
Please schedule follow up scheduled with myself in 6 months.  If my schedule is not open yet, we will contact you with a reminder closer to that time. Please call [phone] if you haven't heard from [place] a month before.  [place] for virtual visit.   Continue albuterol inhaler as needed.  Let me know if breathing symptoms worsen and we can try a different inhaler besides spiriva.   You will be due for your next CT scan for lung cancer screening in September 2023 assuming your scan from today was normal. I will contact you with results.   Your overnight oximetry did not show any evidence of low oxygen levels at night.

## 2022-08-17 NOTE — Addendum Note (Signed)
Addended by: Douglass Rivers D on: 08/17/2022 01:53 PM   Modules accepted: Level of Service

## 2022-08-17 NOTE — Progress Notes (Signed)
Full PFT performed today. °

## 2022-08-17 NOTE — Progress Notes (Signed)
[name]    [id number]    [birth year]  Primary Care Physician:Dinsbeer, Ander Purpura, FNP Date of Appointment: 08/17/2022 Established Patient Visit  Chief complaint:   Chief Complaint  Patient presents with   Follow-up    Pt f/u to discuss PFT. Pt can't tolerate Spiriva (caused nausea and increased coughing) has had to use albuterol a couple times     HPI: [name] is a 61 y.o. woman with history of tobacco use disorder and OSA not on cpap.   Interval Updates: Here for follow up after PFTs. Had worsening cough and nausea with spiriva. Stopped using. Only used albuterol twice since last visit. No interval hospitalizations or ED visits. Reviewed ONO - no desaturation below 88% to require overnight oxygen given her sleep apnea.  She quit smoking! Used the patch and willpower.  Had LDCT for lung cancer screening today - read is pending.   I have reviewed the patient's family social and past medical history and updated as appropriate.   Past Medical History:  Diagnosis Date   Allergy    Anxiety    Arthritis    Asthma    Cataract    Depression    bipolar disorder   Emphysema, unspecified ([place])    Esophageal stricture    GERD (gastroesophageal reflux disease)    Heart defect    HLD (hyperlipidemia)    Hypothyroidism    IBS (irritable bowel syndrome)    IFG (impaired fasting glucose)    Migraine headache    Obesity    OSA (obstructive sleep apnea)    Pacemaker    Prediabetes    Reflux    Sleep apnea    SOB (shortness of breath)    Symptomatic bradycardia     Past Surgical History:  Procedure Laterality Date   ABDOMINAL HERNIA REPAIR     BREAST CYST EXCISION     left   Cesarean sections     x2   CHOLECYSTECTOMY     COLONOSCOPY     dental implants     foor surgery Right    screws    FOOT SURGERY     screws in left foot   PACEMAKER INSERTION     TUBAL LIGATION     VAGINAL HYSTERECTOMY      Family History  Problem Relation Age of Onset    Arthritis Mother    Heart disease Mother        coronary; female < 84   Asthma Mother    High Cholesterol Mother    Thyroid disease Mother    Depression Mother    Bipolar disorder Mother    Throat cancer Father        squamous cell carcinoma   Tongue cancer Father    Esophageal cancer Father    Depression Father    Colon cancer Neg Hx    Rectal cancer Neg Hx    Stomach cancer Neg Hx     Social History   Occupational History   Occupation: retired    Fish farm manager: [employer]   Occupation: Nana, Housewife  Tobacco Use   Smoking status: Former    Packs/day: 1.50    Years: 60.00    Total pack years: 90.00    Types: Cigarettes    Quit date: 07/08/2022    Years since quitting: 0.1   Smokeless tobacco: Never  Vaping Use   Vaping Use: Former  Substance and Sexual Activity   Alcohol  use: No    Alcohol/week: 0.0 standard drinks of alcohol    Comment: Occ.    Drug use: No   Sexual activity: Not on file     Physical Exam: Blood pressure 126/82, pulse 65, height '5\' 5"'$  (1.651 m), weight 215 lb (97.5 kg), SpO2 98 %.  Gen:      No acute distress ENT:  no nasal polyps, mucus membranes moist Lungs:    No increased respiratory effort, symmetric chest wall excursion, clear to auscultation bilaterally, no wheezes or crackles CV:         Regular rate and rhythm; no murmurs, rubs, or gallops.  No pedal edema   Data Reviewed: Imaging: I have personally reviewed the the CT scan of her chest - I see no obvious lung masses, emphysema, nodules but am still awaiting the final read.   PFTs:     Latest Ref Rng & Units 08/17/2022    2:41 PM  PFT Results  FVC-Pre L 3.01  P  FVC-Predicted Pre % 87  P  FVC-Post L 3.16  P  FVC-Predicted Post % 91  P  Pre FEV1/FVC % % 82  P  Post FEV1/FCV % % 79  P  FEV1-Pre L 2.46  P  FEV1-Predicted Pre % 91  P  FEV1-Post L 2.48  P  DLCO uncorrected ml/min/mmHg 25.19  P  DLCO UNC% % 119  P  DLCO corrected ml/min/mmHg 25.19  P  DLCO COR %Predicted % 119   P  DLVA Predicted % 116  P  TLC L 5.56  P  TLC % Predicted % 106  P  RV % Predicted % 121  P    P Preliminary result   I have personally reviewed the patient's PFTs and normal pft  Labs:  Immunization status: Immunization History  Administered Date(s) Administered   Influenza Split 09/04/2019   Influenza, Quadrivalent, Recombinant, Inj, Pf 08/25/2019   Influenza,inj,Quad PF,6+ Mos 12/04/2015, 10/17/2017, 09/26/2018   Influenza-Unspecified 09/12/2011, 09/11/2019   Moderna Sars-Covid-2 Vaccination 03/16/2020, 04/13/2020   Zoster Recombinat (Shingrix) 08/25/2019, 09/11/2019    External Records Personally Reviewed: lung cancer screening  Assessment:  Tobacco use disorder - recent cessation OSA, not on cpap  Plan/Recommendations: Continue prn albuterol Will follow today for read for CT scan from radiology - formal pending. If normal plan would be repeat in one year.  No nocturnal hypoxemia to warrant nocturnal oxygen therapy for OSA unable to tolerate CPAP.      Return to Care: Return in about 6 months (around 02/15/2023).   Lenice Llamas, MD [facility] [place]

## 2022-08-17 NOTE — Progress Notes (Signed)
Remote pacemaker transmission.   

## 2022-08-17 NOTE — Patient Instructions (Signed)
Full PFT performed today. °

## 2022-08-18 ENCOUNTER — Ambulatory Visit (INDEPENDENT_AMBULATORY_CARE_PROVIDER_SITE_OTHER): Payer: 59

## 2022-08-18 DIAGNOSIS — I495 Sick sinus syndrome: Secondary | ICD-10-CM | POA: Diagnosis not present

## 2022-08-18 LAB — CUP PACEART REMOTE DEVICE CHECK
Battery Remaining Longevity: 1 mo
Battery Remaining Percentage: 0.5 %
Battery Voltage: 2.6 V
Brady Statistic AP VP Percent: 1 %
Brady Statistic AP VS Percent: 1.1 %
Brady Statistic AS VP Percent: 1 %
Brady Statistic AS VS Percent: 99 %
Brady Statistic RA Percent Paced: 1 %
Brady Statistic RV Percent Paced: 1 %
Date Time Interrogation Session: 20230907060026
Implantable Lead Implant Date: 20111130
Implantable Lead Implant Date: 20111130
Implantable Lead Location: 753859
Implantable Lead Location: 753860
Implantable Pulse Generator Implant Date: 20111130
Lead Channel Impedance Value: 360 Ohm
Lead Channel Impedance Value: 400 Ohm
Lead Channel Pacing Threshold Amplitude: 0.875 V
Lead Channel Pacing Threshold Amplitude: 1.75 V
Lead Channel Pacing Threshold Pulse Width: 0.4 ms
Lead Channel Pacing Threshold Pulse Width: 0.4 ms
Lead Channel Sensing Intrinsic Amplitude: 3 mV
Lead Channel Sensing Intrinsic Amplitude: 3.9 mV
Lead Channel Setting Pacing Amplitude: 1.875
Lead Channel Setting Pacing Amplitude: 2 V
Lead Channel Setting Pacing Pulse Width: 0.4 ms
Lead Channel Setting Sensing Sensitivity: 2 mV
Pulse Gen Model: 2210
Pulse Gen Serial Number: 7192850

## 2022-08-19 ENCOUNTER — Telehealth: Payer: Self-pay

## 2022-08-19 ENCOUNTER — Other Ambulatory Visit: Payer: Self-pay

## 2022-08-19 DIAGNOSIS — Z87891 Personal history of nicotine dependence: Secondary | ICD-10-CM

## 2022-08-19 DIAGNOSIS — Z122 Encounter for screening for malignant neoplasm of respiratory organs: Secondary | ICD-10-CM

## 2022-08-19 NOTE — Telephone Encounter (Signed)
Scheduled remote reviewed. Normal device function.  The battery voltage to 2.60 volts and ERI is 2.60 volts  Sent to triage.  Next remote 09/19/2022.  Patient called to let her know her PPM battery reached replacement time and will need apt to discuss gen change. Advised I will forward to scheduling and someone will call to make apt. Voiced understanding.

## 2022-08-28 NOTE — Progress Notes (Unsigned)
Cardiology Office Note Date:  08/28/2022  Patient ID:  [name] [birth year], MRN [id number] PCP:  Teressa Senter, FNP  Electrophysiologist: Dr. Lovena Le  ***refresh   Chief Complaint: *** gen change  History of Present Illness: [name] is a 61 y.o. female with history of Bipolar d/o, Hypothyroidism, GERD, OSA untreated (intolerant), SND w/PPM  She [facility], doing well with occasional episodes of waking with some palpitations.  Device interrogation noted new AFib, only 2 min, no a/c warranted.   Device clinic noted votage to 2.6V, called in to discuss gen change  *** symptoms *** procedure *** meds *** labs, lipids *** AFib?  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/10/2010   Past Medical History:  Diagnosis Date   Allergy    Anxiety    Arthritis    Asthma    Cataract    Depression    bipolar disorder   Emphysema, unspecified ([place])    Esophageal stricture    GERD (gastroesophageal reflux disease)    Heart defect    HLD (hyperlipidemia)    Hypothyroidism    IBS (irritable bowel syndrome)    IFG (impaired fasting glucose)    Migraine headache    Obesity    OSA (obstructive sleep apnea)    Pacemaker    Prediabetes    Reflux    Sleep apnea    SOB (shortness of breath)    Symptomatic bradycardia     Past Surgical History:  Procedure Laterality Date   ABDOMINAL HERNIA REPAIR     BREAST CYST EXCISION     left   Cesarean sections     x2   CHOLECYSTECTOMY     COLONOSCOPY     dental implants     foor surgery Right    screws    FOOT SURGERY     screws in left foot   PACEMAKER INSERTION     TUBAL LIGATION     VAGINAL HYSTERECTOMY      Current Outpatient Medications  Medication Sig Dispense Refill   albuterol (VENTOLIN HFA) 108 (90 Base) MCG/ACT inhaler INHALE 2 PUFFS BY MOUTH EVERY 4 HOURS AS NEEDED FOR WHEEZING OR SHORTNESS OF BREATH 18 g 5   ALPRAZolam (XANAX) 0.5 MG tablet Take 1 tablet (0.5 mg  total) by mouth 2 (two) times daily as needed for anxiety. TAKE 1/2 (ONE-HALF) TABLET BY MOUTH TWICE DAILY AS NEEDED FOR ANXIETY OR  SLEEP 30 tablet 0   EUTHYROX 88 MCG tablet Take 1 tablet by mouth once daily 90 tablet 1   fluticasone (FLONASE) 50 MCG/ACT nasal spray Place 2 sprays into both nostrils daily. 16 g 1   fluticasone-salmeterol (ADVAIR HFA) 115-21 MCG/ACT inhaler Inhale 2 puffs into the lungs 2 (two) times daily. 1 Inhaler 5   omeprazole (PRILOSEC) 20 MG capsule Take 1 capsule (20 mg total) by mouth 2 (two) times daily. 60 capsule 5   rosuvastatin (CRESTOR) 10 MG tablet TAKE 1 TABLET BY MOUTH ONCE DAILY FOR CHOLESTEROL 90 tablet 1   Vitamin D, Ergocalciferol, (DRISDOL) 1.25 MG (50000 UNIT) CAPS capsule Take 1 capsule (50,000 Units total) by mouth every 7 (seven) days. 4 capsule 0   No current facility-administered medications for this visit.    Allergies:   Misc. sulfonamide containing compounds, Codeine, Sulfonamide derivatives, Betadine [povidone iodine], Paxil [paroxetine hcl], and Vioxx [rofecoxib]   Social History:  The patient  reports that she quit smoking about 7 weeks ago.  Her smoking use included cigarettes. She has a 90.00 pack-year smoking history. She has never used smokeless tobacco. She reports that she does not drink alcohol and does not use drugs.   Family History:  The patient's family history includes Arthritis in her mother; Asthma in her mother; Bipolar disorder in her mother; Depression in her father and mother; Esophageal cancer in her father; Heart disease in her mother; High Cholesterol in her mother; Throat cancer in her father; Thyroid disease in her mother; Tongue cancer in her father.  ROS:  Please see the history of present illness.    All other systems are reviewed and otherwise negative.   PHYSICAL EXAM:  VS:  There were no vitals taken for this visit. BMI: There is no height or weight on file to calculate BMI. Well nourished, well developed, in no  acute distress HEENT: normocephalic, atraumatic Neck: no JVD, carotid bruits or masses Cardiac:  *** RRR; no significant murmurs, no rubs, or gallops Lungs:  *** CTA b/l, no wheezing, rhonchi or rales Abd: soft, nontender MS: no deformity or *** atrophy Ext: *** no edema Skin: warm and dry, no rash Neuro:  No gross deficits appreciated Psych: euthymic mood, full affect  *** PPM site is stable, no tethering or discomfort   EKG:  Done today and reviewed by myself shows  ***  Device interrogation done today and reviewed by myself:  ***    09/24/2010: TTE Study Conclusions   - Left ventricle: The cavity size was normal. Wall thickness was     normal. Systolic function was normal. The estimated ejection     fraction was in the range of 55% to 60%. Wall motion was normal;     there were no regional wall motion abnormalities. The study is not     technically sufficient to allow evaluation of LV diastolic     function.   - Mitral valve: Trivial regurgitation.   - Tricuspid valve: Trivial regurgitation.   - Pericardium, extracardiac: There was no pericardial effusion.   Recent Labs: No results found for requested labs within last 365 days.  No results found for requested labs within last 365 days.   CrCl cannot be calculated (Patient's most recent lab result is older than the maximum 21 days allowed.).   Wt Readings from Last 3 Encounters:  08/17/22 215 lb (97.5 kg)  06/15/22 201 lb 12.8 oz (91.5 kg)  11/10/21 204 lb (92.5 kg)     Other studies reviewed: Additional studies/records reviewed today include: summarized above  ASSESSMENT AND PLAN:  PPM ***  Paroxysmal AFib CHA2DS2Vasc is one (gender) ***    Disposition: F/u with ***  Current medicines are reviewed at length with the patient today.  The patient did not have any concerns regarding medicines.  Venetia Night, PA-C 08/28/2022 12:59 PM     [place] [address] [phone] (office)  [phone] (fax)

## 2022-08-29 DIAGNOSIS — J45909 Unspecified asthma, uncomplicated: Secondary | ICD-10-CM | POA: Insufficient documentation

## 2022-08-29 DIAGNOSIS — H269 Unspecified cataract: Secondary | ICD-10-CM | POA: Insufficient documentation

## 2022-08-29 DIAGNOSIS — M1990 Unspecified osteoarthritis, unspecified site: Secondary | ICD-10-CM | POA: Insufficient documentation

## 2022-08-29 DIAGNOSIS — T7840XA Allergy, unspecified, initial encounter: Secondary | ICD-10-CM | POA: Insufficient documentation

## 2022-08-29 DIAGNOSIS — R0602 Shortness of breath: Secondary | ICD-10-CM | POA: Insufficient documentation

## 2022-08-29 DIAGNOSIS — R001 Bradycardia, unspecified: Secondary | ICD-10-CM | POA: Insufficient documentation

## 2022-08-29 DIAGNOSIS — Q249 Congenital malformation of heart, unspecified: Secondary | ICD-10-CM | POA: Insufficient documentation

## 2022-08-29 DIAGNOSIS — E785 Hyperlipidemia, unspecified: Secondary | ICD-10-CM | POA: Insufficient documentation

## 2022-08-29 DIAGNOSIS — G43909 Migraine, unspecified, not intractable, without status migrainosus: Secondary | ICD-10-CM | POA: Insufficient documentation

## 2022-08-29 DIAGNOSIS — K222 Esophageal obstruction: Secondary | ICD-10-CM | POA: Insufficient documentation

## 2022-08-29 DIAGNOSIS — R7301 Impaired fasting glucose: Secondary | ICD-10-CM | POA: Insufficient documentation

## 2022-08-29 DIAGNOSIS — K589 Irritable bowel syndrome without diarrhea: Secondary | ICD-10-CM | POA: Insufficient documentation

## 2022-08-29 DIAGNOSIS — J439 Emphysema, unspecified: Secondary | ICD-10-CM | POA: Insufficient documentation

## 2022-08-29 DIAGNOSIS — R7303 Prediabetes: Secondary | ICD-10-CM | POA: Insufficient documentation

## 2022-08-30 ENCOUNTER — Ambulatory Visit: Payer: 59 | Attending: Physician Assistant | Admitting: Physician Assistant

## 2022-08-30 ENCOUNTER — Encounter: Payer: Self-pay | Admitting: *Deleted

## 2022-08-30 ENCOUNTER — Encounter: Payer: Self-pay | Admitting: Physician Assistant

## 2022-08-30 VITALS — BP 120/80 | HR 69 | Ht 65.0 in | Wt 215.0 lb

## 2022-08-30 DIAGNOSIS — Z01818 Encounter for other preprocedural examination: Secondary | ICD-10-CM | POA: Diagnosis not present

## 2022-08-30 DIAGNOSIS — I480 Paroxysmal atrial fibrillation: Secondary | ICD-10-CM

## 2022-08-30 DIAGNOSIS — Z45010 Encounter for checking and testing of cardiac pacemaker pulse generator [battery]: Secondary | ICD-10-CM

## 2022-08-30 LAB — CUP PACEART INCLINIC DEVICE CHECK
Battery Remaining Longevity: 0 mo
Battery Voltage: 2.6 V
Brady Statistic RA Percent Paced: 0.97 %
Brady Statistic RV Percent Paced: 0.09 %
Date Time Interrogation Session: 20230919085651
Implantable Lead Implant Date: 20111130
Implantable Lead Implant Date: 20111130
Implantable Lead Location: 753859
Implantable Lead Location: 753860
Implantable Pulse Generator Implant Date: 20111130
Lead Channel Impedance Value: 400 Ohm
Lead Channel Impedance Value: 425 Ohm
Lead Channel Pacing Threshold Amplitude: 0.875 V
Lead Channel Pacing Threshold Amplitude: 1.75 V
Lead Channel Pacing Threshold Pulse Width: 0.4 ms
Lead Channel Pacing Threshold Pulse Width: 0.4 ms
Lead Channel Sensing Intrinsic Amplitude: 4.8 mV
Lead Channel Sensing Intrinsic Amplitude: 5 mV
Lead Channel Setting Pacing Amplitude: 1.875
Lead Channel Setting Pacing Amplitude: 2 V
Lead Channel Setting Pacing Pulse Width: 0.4 ms
Lead Channel Setting Sensing Sensitivity: 2 mV
Pulse Gen Model: 2210
Pulse Gen Serial Number: 7192850

## 2022-08-30 NOTE — Patient Instructions (Addendum)
Medication Instructions:   Your physician recommends that you continue on your current medications as directed. Please refer to the Current Medication list given to you today.   *If you need a refill on your cardiac medications before your next appointment, please call your pharmacy*   Lab Work:  RETURN FOR LABS ON BMET AND CBC ON SAME DAY AS ECHOCARDIOGRAM   If you have labs (blood work) drawn today and your tests are completely normal, you will receive your results only by: MyChart Message (if you have MyChart) OR A paper copy in the mail If you have any lab test that is abnormal or we need to change your treatment, we will call you to review the results.   Testing/Procedures: BEFORE 09-30-22  Your physician has requested that you have an echocardiogram. Echocardiography is a painless test that uses sound waves to create images of your heart. It provides your doctor with information about the size and shape of your heart and how well your heart's chambers and valves are working. This procedure takes approximately one hour. There are no restrictions for this procedure.  SEE LETTER FOR GEN CHANGE  ON 09-30-22  WITH DR Lovena Le   Follow-Up: At [facility], you and your health needs are our priority.  As part of our continuing mission to provide you with exceptional heart care, we have created designated Provider Care Teams.  These Care Teams include your primary Cardiologist (physician) and Advanced Practice Providers (APPs -  Physician Assistants and Nurse Practitioners) who all work together to provide you with the care you need, when you need it.  We recommend signing up for the patient portal called "MyChart".  Sign up information is provided on this After Visit Summary.  MyChart is used to connect with patients for Virtual Visits (Telemedicine).  Patients are able to view lab/test results, encounter notes, upcoming appointments, etc.  Non-urgent messages can be sent to your  provider as well.   To learn more about what you can do with MyChart, go to [email or web address].    Your next appointment:  AFTER 09-30-22   10-14 DAYS WITH DEVICE CLINIC FOR WOUND   3 month(s)  The format for your next appointment:   In Person  Provider:   Cristopher Peru, MD  [facility] PACER [facility]   Other Instructions  Important Information About Sugar

## 2022-09-03 NOTE — Progress Notes (Signed)
Remote pacemaker transmission.   

## 2022-09-08 NOTE — Progress Notes (Signed)
Patient moved from 09/30/2022 at 930 am gen change with Dr. Lovena Le to 10/07/2022 at 1130 am.  Letter with updates sent to patient.

## 2022-09-13 ENCOUNTER — Encounter: Payer: Self-pay | Admitting: *Deleted

## 2022-09-15 ENCOUNTER — Ambulatory Visit (HOSPITAL_COMMUNITY): Payer: 59 | Attending: Physician Assistant

## 2022-09-15 ENCOUNTER — Ambulatory Visit: Payer: 59

## 2022-09-15 DIAGNOSIS — Z01810 Encounter for preprocedural cardiovascular examination: Secondary | ICD-10-CM | POA: Diagnosis not present

## 2022-09-15 DIAGNOSIS — Z01818 Encounter for other preprocedural examination: Secondary | ICD-10-CM | POA: Insufficient documentation

## 2022-09-15 DIAGNOSIS — I480 Paroxysmal atrial fibrillation: Secondary | ICD-10-CM | POA: Insufficient documentation

## 2022-09-15 LAB — BASIC METABOLIC PANEL
BUN/Creatinine Ratio: 8 — ABNORMAL LOW (ref 9–23)
BUN: 8 mg/dL (ref 6–24)
CO2: 29 mmol/L (ref 20–29)
Calcium: 9.7 mg/dL (ref 8.7–10.2)
Chloride: 105 mmol/L (ref 96–106)
Creatinine, Ser: 1 mg/dL (ref 0.57–1.00)
Glucose: 80 mg/dL (ref 70–99)
Potassium: 3.6 mmol/L (ref 3.5–5.2)
Sodium: 140 mmol/L (ref 134–144)
eGFR: 65 mL/min/{1.73_m2} (ref >59)

## 2022-09-15 LAB — ECHOCARDIOGRAM COMPLETE
Area-P 1/2: 4.6 cm2
S' Lateral: 3 cm

## 2022-09-15 LAB — CBC
Hematocrit: 41.7 % (ref 34.0–46.6)
Hemoglobin: 14.3 g/dL (ref 11.1–15.9)
MCH: 30.5 pg (ref 26.6–33.0)
MCHC: 34.3 g/dL (ref 31.5–35.7)
MCV: 89 fL (ref 79–97)
Platelets: 437 10*3/uL (ref 150–450)
RBC: 4.69 x10E6/uL (ref 3.77–5.28)
RDW: 13.7 % (ref 11.7–15.4)
WBC: 7.4 10*3/uL (ref 3.4–10.8)

## 2022-09-16 ENCOUNTER — Telehealth: Payer: Self-pay | Admitting: Student

## 2022-09-16 NOTE — Telephone Encounter (Signed)
Shirley Friar, PA-C  09/16/2022  2:33 PM EDT     EF normal. Pending gen change.  Slightly stiff but no significant changes from echo in 2011   [place], [place]  09/16/2022  2:34 PM EDT Back to Top    K borderline. Will add BMET to her gen change encounter.    The patient has been notified of the result and verbalized understanding.  All questions (if any) were answered. Michaelyn Barter, RN 09/16/2022 5:19 PM

## 2022-09-16 NOTE — Telephone Encounter (Signed)
  Pt said, she missed a called from [place] and she thinks its about her lab and echo result

## 2022-09-19 ENCOUNTER — Ambulatory Visit (INDEPENDENT_AMBULATORY_CARE_PROVIDER_SITE_OTHER): Payer: 59

## 2022-09-19 DIAGNOSIS — I480 Paroxysmal atrial fibrillation: Secondary | ICD-10-CM

## 2022-09-20 ENCOUNTER — Telehealth: Payer: Self-pay

## 2022-09-20 LAB — CUP PACEART REMOTE DEVICE CHECK
Battery Remaining Longevity: 1 mo
Battery Remaining Percentage: 0.5 %
Battery Voltage: 2.59 V
Brady Statistic AP VP Percent: 1 %
Brady Statistic AP VS Percent: 1.1 %
Brady Statistic AS VP Percent: 1 %
Brady Statistic AS VS Percent: 99 %
Brady Statistic RA Percent Paced: 1 %
Brady Statistic RV Percent Paced: 1 %
Date Time Interrogation Session: 20231008061056
Implantable Lead Implant Date: 20111130
Implantable Lead Implant Date: 20111130
Implantable Lead Location: 753859
Implantable Lead Location: 753860
Implantable Pulse Generator Implant Date: 20111130
Lead Channel Impedance Value: 390 Ohm
Lead Channel Impedance Value: 400 Ohm
Lead Channel Pacing Threshold Amplitude: 0.875 V
Lead Channel Pacing Threshold Amplitude: 2 V
Lead Channel Pacing Threshold Pulse Width: 0.4 ms
Lead Channel Pacing Threshold Pulse Width: 0.4 ms
Lead Channel Sensing Intrinsic Amplitude: 4.2 mV
Lead Channel Sensing Intrinsic Amplitude: 5 mV
Lead Channel Setting Pacing Amplitude: 1.875
Lead Channel Setting Pacing Amplitude: 2.25 V
Lead Channel Setting Pacing Pulse Width: 0.4 ms
Lead Channel Setting Sensing Sensitivity: 2 mV
Pulse Gen Model: 2210
Pulse Gen Serial Number: 7192850

## 2022-09-20 NOTE — Telephone Encounter (Signed)
Scheduled remote reviewed. Normal device function.   Device has reached ERI per voltage, 2.59V, ERI 2.60V - route to triage RV threshold 2V @ 0.41m, stable  Generator change scheduled.  No action needed.

## 2022-09-30 NOTE — Progress Notes (Signed)
Remote pacemaker transmission. ,

## 2022-09-30 NOTE — Addendum Note (Signed)
Addended by: Douglass Rivers D on: 09/30/2022 09:43 PM   Modules accepted: Level of Service

## 2022-10-06 NOTE — Pre-Procedure Instructions (Signed)
Instructed patient on the following items: Arrival time 0930 Nothing to eat or drink after midnight No meds AM of procedure Responsible person to drive you home and stay with you for 24 hrs Wash with special soap night before and morning of procedure  

## 2022-10-07 ENCOUNTER — Ambulatory Visit (HOSPITAL_COMMUNITY)
Admission: RE | Admit: 2022-10-07 | Discharge: 2022-10-07 | Disposition: A | Discharge status: Home / Self Care | Payer: 59 | Attending: Internal Medicine | Admitting: Internal Medicine

## 2022-10-07 ENCOUNTER — Ambulatory Visit (HOSPITAL_COMMUNITY)
Admission: RE | Disposition: A | Discharge status: Home / Self Care | Payer: Self-pay | Source: Home / Self Care | Attending: Internal Medicine

## 2022-10-07 ENCOUNTER — Other Ambulatory Visit: Payer: Self-pay

## 2022-10-07 DIAGNOSIS — I495 Sick sinus syndrome: Secondary | ICD-10-CM | POA: Insufficient documentation

## 2022-10-07 DIAGNOSIS — I10 Essential (primary) hypertension: Secondary | ICD-10-CM | POA: Insufficient documentation

## 2022-10-07 DIAGNOSIS — I480 Paroxysmal atrial fibrillation: Secondary | ICD-10-CM | POA: Diagnosis not present

## 2022-10-07 DIAGNOSIS — Z6836 Body mass index (BMI) 36.0-36.9, adult: Secondary | ICD-10-CM | POA: Insufficient documentation

## 2022-10-07 DIAGNOSIS — Z45010 Encounter for checking and testing of cardiac pacemaker pulse generator [battery]: Secondary | ICD-10-CM | POA: Insufficient documentation

## 2022-10-07 DIAGNOSIS — F17210 Nicotine dependence, cigarettes, uncomplicated: Secondary | ICD-10-CM | POA: Insufficient documentation

## 2022-10-07 DIAGNOSIS — E669 Obesity, unspecified: Secondary | ICD-10-CM | POA: Insufficient documentation

## 2022-10-07 DIAGNOSIS — R001 Bradycardia, unspecified: Secondary | ICD-10-CM

## 2022-10-07 HISTORY — PX: PPM GENERATOR CHANGEOUT: EP1233

## 2022-10-07 LAB — BASIC METABOLIC PANEL
Anion gap: 13 (ref 5–15)
BUN: 7 mg/dL (ref 6–20)
CO2: 25 mmol/L (ref 22–32)
Calcium: 9.3 mg/dL (ref 8.9–10.3)
Chloride: 103 mmol/L (ref 98–111)
Creatinine, Ser: 0.99 mg/dL (ref 0.44–1.00)
GFR, Estimated: >60 mL/min (ref >60)
Glucose, Bld: 119 mg/dL — ABNORMAL HIGH (ref 70–99)
Potassium: 3.8 mmol/L (ref 3.5–5.1)
Sodium: 141 mmol/L (ref 135–145)

## 2022-10-07 LAB — SURGICAL PCR SCREEN
MRSA, PCR: NEGATIVE
Staphylococcus aureus: NEGATIVE

## 2022-10-07 SURGERY — PPM GENERATOR CHANGEOUT
Anesthesia: LOCAL

## 2022-10-07 MED ORDER — ACETAMINOPHEN 325 MG PO TABS: 325.0000 mg | ORAL_TABLET | ORAL | Status: DC | PRN

## 2022-10-07 MED ORDER — ONDANSETRON HCL 4 MG/2ML IJ SOLN: 4.0000 mg | Freq: Four times a day (QID) | INTRAMUSCULAR | Status: DC | PRN

## 2022-10-07 MED ORDER — LIDOCAINE HCL (PF) 1 % IJ SOLN
INTRAMUSCULAR | Status: AC
  Filled 2022-10-07: qty 30

## 2022-10-07 MED ORDER — MUPIROCIN 2 % EX OINT
1.0000 | TOPICAL_OINTMENT | Freq: Once | CUTANEOUS | Status: AC
  Administered 2022-10-07: 1 via TOPICAL

## 2022-10-07 MED ORDER — SODIUM CHLORIDE 0.9 % IV SOLN
INTRAVENOUS | Status: AC
  Filled 2022-10-07: qty 2

## 2022-10-07 MED ORDER — MUPIROCIN 2 % EX OINT: 1.0000 | TOPICAL_OINTMENT | Freq: Two times a day (BID) | CUTANEOUS | Status: DC

## 2022-10-07 MED ORDER — SODIUM CHLORIDE 0.9 % IV SOLN: INTRAVENOUS | Status: DC

## 2022-10-07 MED ORDER — CHLORHEXIDINE GLUCONATE 4 % EX LIQD
4.0000 | Freq: Once | CUTANEOUS | Status: DC
  Filled 2022-10-07: qty 60

## 2022-10-07 MED ORDER — FENTANYL CITRATE (PF) 100 MCG/2ML IJ SOLN
INTRAMUSCULAR | Status: DC | PRN
  Administered 2022-10-07: 25 ug via INTRAVENOUS

## 2022-10-07 MED ORDER — MIDAZOLAM HCL 5 MG/5ML IJ SOLN
INTRAMUSCULAR | Status: AC
  Filled 2022-10-07: qty 5

## 2022-10-07 MED ORDER — FENTANYL CITRATE (PF) 100 MCG/2ML IJ SOLN
INTRAMUSCULAR | Status: AC
  Filled 2022-10-07: qty 2

## 2022-10-07 MED ORDER — MUPIROCIN 2 % EX OINT
TOPICAL_OINTMENT | CUTANEOUS | Status: AC
  Filled 2022-10-07: qty 22

## 2022-10-07 MED ORDER — LIDOCAINE HCL 1 % IJ SOLN
INTRAMUSCULAR | Status: AC
  Filled 2022-10-07: qty 40

## 2022-10-07 MED ORDER — SODIUM CHLORIDE 0.9 % IV SOLN
80.0000 mg | INTRAVENOUS | Status: AC
  Administered 2022-10-07: 80 mg

## 2022-10-07 MED ORDER — MIDAZOLAM HCL 5 MG/5ML IJ SOLN
INTRAMUSCULAR | Status: DC | PRN
  Administered 2022-10-07: 2 mg via INTRAVENOUS

## 2022-10-07 MED ORDER — LIDOCAINE HCL (PF) 1 % IJ SOLN
INTRAMUSCULAR | Status: DC | PRN
  Administered 2022-10-07: 50 mL

## 2022-10-07 MED ORDER — CEFAZOLIN SODIUM-DEXTROSE 2-4 GM/100ML-% IV SOLN
2.0000 g | INTRAVENOUS | Status: AC
  Administered 2022-10-07: 2 g via INTRAVENOUS

## 2022-10-07 MED ORDER — CEFAZOLIN SODIUM-DEXTROSE 2-4 GM/100ML-% IV SOLN
INTRAVENOUS | Status: AC
  Filled 2022-10-07: qty 100

## 2022-10-07 SURGICAL SUPPLY — 4 items
CABLE SURGICAL S-101-97-12 (CABLE) ×1 IMPLANT
PACEMAKER ASSURITY DR-RF (Pacemaker) IMPLANT
PAD DEFIB RADIO PHYSIO CONN (PAD) ×1 IMPLANT
TRAY PACEMAKER INSERTION (PACKS) ×1 IMPLANT

## 2022-10-07 NOTE — H&P (Signed)
HPI [name] returns today for followup. She has a h/o sinus node dysfunction, s/p PPM insertion. She denies chest pain or sob. She has had occaisional episodes where she feels like she has been awakened with palpitations.      Allergies  Allergen Reactions   Codeine     Sulfonamide Derivatives     Betadine [Povidone Iodine] Other (See Comments)      Blisters    Paxil [Paroxetine Hcl] Other (See Comments)      Extreme sweating   Vioxx [Rofecoxib] Rash              Current Outpatient Medications  Medication Sig Dispense Refill   albuterol (VENTOLIN HFA) 108 (90 Base) MCG/ACT inhaler INHALE 2 PUFFS BY MOUTH EVERY 4 HOURS AS NEEDED FOR WHEEZING OR SHORTNESS OF BREATH 18 g 5   ALPRAZolam (XANAX) 1 MG tablet TAKE 1/2 (ONE-HALF) TABLET BY MOUTH TWICE DAILY AS NEEDED FOR ANXIETY OR  SLEEP 30 tablet 5   EUTHYROX 75 MCG tablet TAKE 1 TABLET BY MOUTH ONCE DAILY (NEEDS  OFFICE  VISIT) 30 tablet 0   fluticasone (FLONASE) 50 MCG/ACT nasal spray Place 1 spray into both nostrils daily. 16 g 5   HYDROcodone-homatropine (HYCODAN) 5-1.5 MG/5ML syrup Take one tspn po qhs prn cough 90 mL 0   levothyroxine (SYNTHROID) 88 MCG tablet Take one tablet po daily 90 tablet 3   omeprazole (PRILOSEC) 20 MG capsule Take 1 capsule (20 mg total) by mouth 2 (two) times daily. 60 capsule 5   rosuvastatin (CRESTOR) 10 MG tablet TAKE 1 TABLET BY MOUTH ONCE DAILY FOR CHOLESTEROL 90 tablet 1   sertraline (ZOLOFT) 100 MG tablet Take 1.5 tablets (150 mg total) by mouth daily. 135 tablet 1   umeclidinium bromide (INCRUSE ELLIPTA) 62.5 MCG/INH AEPB Inhale 1 puff into the lungs daily. 30 each 3   Vitamin D, Ergocalciferol, (DRISDOL) 50000 units CAPS capsule Take 1 capsule (50,000 Units total) by mouth every 7 (seven) days. 4 capsule 2    No current facility-administered medications for this visit.            Past Medical History:  Diagnosis Date   Allergy     Anxiety     Arthritis     Asthma      Cataract     Depression      bipolar disorder   Esophageal stricture     GERD (gastroesophageal reflux disease)     Heart defect     HLD (hyperlipidemia)     Hypothyroidism     IBS (irritable bowel syndrome)     IFG (impaired fasting glucose)     Migraine headache     OSA (obstructive sleep apnea)     Reflux     Sleep apnea     Symptomatic bradycardia        ROS:    All systems reviewed and negative except as noted in the HPI.          Past Surgical History:  Procedure Laterality Date   ABDOMINAL HERNIA REPAIR       BREAST CYST EXCISION        left   Cesarean sections        x2   CHOLECYSTECTOMY       COLONOSCOPY       dental implants       foor surgery Right      screws    FOOT SURGERY  screws in left foot   PACEMAKER INSERTION       TUBAL LIGATION       VAGINAL HYSTERECTOMY                 Family History  Problem Relation Age of Onset   Throat cancer Father          squamous cell carcinoma   Tongue cancer Father     Esophageal cancer Father     Arthritis Mother     Heart disease Mother          coronary; female < 72   Asthma Mother     Colon cancer Neg Hx     Rectal cancer Neg Hx     Stomach cancer Neg Hx          Social History         Socioeconomic History   Marital status: Married      Spouse name: Not on file   Number of children: 2   Years of education: Not on file   Highest education level: Not on file  Occupational History   Occupation: retired      Fish farm manager: [employer]  Tobacco Use   Smoking status: Current Every Day Smoker      Packs/day: 2.00      Years: 25.00      Pack years: 50.00      Types: Cigarettes   Smokeless tobacco: Never Used   Tobacco comment: tobacco info givin 03/08/16, smokes half pack per day 02.27.20  Substance and Sexual Activity   Alcohol use: No      Alcohol/week: 0.0 standard drinks      Comment: Occ.    Drug use: No   Sexual activity: Not on file  Other Topics Concern   Not on file  Social  History Narrative    Unemployed. 10 cans of soda per day.     Social Determinants of Health       Financial Resource Strain:    Difficulty of Paying Living Expenses: Not on file  Food Insecurity:    Worried About Charity fundraiser in the Last Year: Not on file   [employer] of Food in the Last Year: Not on file  Transportation Needs:    Lack of Transportation (Medical): Not on file   Lack of Transportation (Non-Medical): Not on file  Physical Activity:    Days of Exercise per Week: Not on file   Minutes of Exercise per Session: Not on file  Stress:    Feeling of Stress : Not on file  Social Connections:    Frequency of Communication with Friends and Family: Not on file   Frequency of Social Gatherings with Friends and Family: Not on file   Attends Religious Services: Not on file   Active Member of Clubs or Organizations: Not on file   Attends Archivist Meetings: Not on file   Marital Status: Not on file  Intimate Partner Violence:    Fear of Current or Ex-Partner: Not on file   Emotionally Abused: Not on file   Physically Abused: Not on file   Sexually Abused: Not on file        BP 132/78   Pulse 67   Temp (!) 97.5 F (36.4 C)   Ht '5\' 5"'$  (1.651 m)   Wt 222 lb (100.7 kg)   SpO2 97%   BMI 36.94 kg/m    Physical Exam:   Well appearing NAD  HEENT: Unremarkable Neck:  No JVD, no thyromegally Lymphatics:  No adenopathy Back:  No CVA tenderness Lungs:  Clear with no wheezes HEART:  Regular rate rhythm, no murmurs, no rubs, no clicks Abd:  soft, positive bowel sounds, no organomegally, no rebound, no guarding Ext:  2 plus pulses, no edema, no cyanosis, no clubbing Skin:  No rashes no nodules Neuro:  CN II through XII intact, motor grossly intact     DEVICE  Normal device function.  See PaceArt for details.    Assess/Plan: 1. Obesity - I discussed the importance of weight loss. I asked her to lose 20 lbs. 2. HTN - her bp is reasonably well controlled.  She will continue her current meds. 3. PAF - she has been out of rhythm 2 minutes. No indication for anti-coagulation at this point.    Ponciano Ort.  EP Attending  Patient seen and examined. She has reached ERI. She will undergo PPM gen change out. I have reviewed the indications/risks/benefits/ goals/expectations and she wishes to proceed.  Carleene Overlie Domenique Southers,MD

## 2022-10-07 NOTE — Progress Notes (Signed)
Spoke with [employer] device rep-states he does not need to see pt

## 2022-10-07 NOTE — Discharge Instructions (Signed)

## 2022-10-10 ENCOUNTER — Telehealth: Payer: Self-pay

## 2022-10-10 ENCOUNTER — Encounter (HOSPITAL_COMMUNITY): Payer: Self-pay | Admitting: Internal Medicine

## 2022-10-10 MED FILL — Lidocaine HCl Local Inj 1%: INTRAMUSCULAR | Qty: 40 | Status: AC

## 2022-10-10 MED FILL — Lidocaine HCl Local Preservative Free (PF) Inj 1%: INTRAMUSCULAR | Qty: 30 | Status: AC

## 2022-10-10 NOTE — Telephone Encounter (Signed)
-----   Message from Mamie Levers, NP sent at 10/07/2022  3:40 PM EDT ----- PM gen change today with GT

## 2022-10-10 NOTE — Telephone Encounter (Signed)
No outreach needed for generator change.

## 2022-10-12 ENCOUNTER — Ambulatory Visit: Payer: 59

## 2022-10-12 ENCOUNTER — Ambulatory Visit: Payer: 59 | Admitting: Nurse Practitioner

## 2022-10-14 ENCOUNTER — Ambulatory Visit: Payer: 59 | Attending: Cardiovascular Disease

## 2022-10-14 DIAGNOSIS — I442 Atrioventricular block, complete: Secondary | ICD-10-CM | POA: Diagnosis not present

## 2022-10-14 LAB — CUP PACEART INCLINIC DEVICE CHECK
Battery Remaining Longevity: 151 mo
Battery Voltage: 3.16 V
Brady Statistic RA Percent Paced: 0.3 %
Brady Statistic RV Percent Paced: 0.04 %
Date Time Interrogation Session: 20231103104843
Implantable Lead Connection Status: 753985
Implantable Lead Connection Status: 753985
Implantable Lead Implant Date: 20111130
Implantable Lead Implant Date: 20111130
Implantable Lead Location: 753859
Implantable Lead Location: 753860
Implantable Pulse Generator Implant Date: 20231027
Lead Channel Impedance Value: 425 Ohm
Lead Channel Impedance Value: 425 Ohm
Lead Channel Pacing Threshold Amplitude: 0.75 V
Lead Channel Pacing Threshold Amplitude: 0.75 V
Lead Channel Pacing Threshold Amplitude: 1.625 V
Lead Channel Pacing Threshold Pulse Width: 0.4 ms
Lead Channel Pacing Threshold Pulse Width: 0.4 ms
Lead Channel Pacing Threshold Pulse Width: 0.4 ms
Lead Channel Sensing Intrinsic Amplitude: 5 mV
Lead Channel Sensing Intrinsic Amplitude: 5.4 mV
Lead Channel Setting Pacing Amplitude: 1.875
Lead Channel Setting Pacing Amplitude: 2 V
Lead Channel Setting Pacing Pulse Width: 0.4 ms
Lead Channel Setting Sensing Sensitivity: 2 mV
Pulse Gen Model: 2272
Pulse Gen Serial Number: 8125938

## 2022-10-14 NOTE — Progress Notes (Signed)
Wound check appointment. Steri-strips removed. Wound without redness or edema. Incision edges approximated, wound well healed. Normal device function. Thresholds, sensing, and impedances consistent with implant measurements. Device programmed at chronic outputs/auto capture programmed on for extra safety margin until 3 month visit. Histogram distribution appropriate for patient and level of activity. No mode switches or high ventricular rates noted. Patient educated about wound care, arm mobility. ROV in 3 months with implanting physician.

## 2022-10-14 NOTE — Patient Instructions (Signed)

## 2022-10-21 ENCOUNTER — Ambulatory Visit: Payer: 59 | Admitting: Nurse Practitioner

## 2023-01-09 ENCOUNTER — Ambulatory Visit: Payer: 59 | Attending: Internal Medicine

## 2023-01-09 DIAGNOSIS — I442 Atrioventricular block, complete: Secondary | ICD-10-CM | POA: Diagnosis not present

## 2023-01-10 ENCOUNTER — Ambulatory Visit: Payer: 59 | Attending: Internal Medicine | Admitting: Internal Medicine

## 2023-01-10 VITALS — BP 142/78 | HR 64 | Resp 20 | Wt 218.1 lb

## 2023-01-10 DIAGNOSIS — I4891 Unspecified atrial fibrillation: Secondary | ICD-10-CM

## 2023-01-10 DIAGNOSIS — I442 Atrioventricular block, complete: Secondary | ICD-10-CM

## 2023-01-10 DIAGNOSIS — Z950 Presence of cardiac pacemaker: Secondary | ICD-10-CM

## 2023-01-10 LAB — CUP PACEART REMOTE DEVICE CHECK
Battery Remaining Longevity: 133 mo
Battery Remaining Percentage: 95.5 %
Battery Voltage: 3.05 V
Brady Statistic AP VP Percent: 1 %
Brady Statistic AP VS Percent: 1 %
Brady Statistic AS VP Percent: 1 %
Brady Statistic AS VS Percent: 99 %
Brady Statistic RA Percent Paced: 1 %
Brady Statistic RV Percent Paced: 1 %
Date Time Interrogation Session: 20240129035148
Implantable Lead Connection Status: 753985
Implantable Lead Connection Status: 753985
Implantable Lead Implant Date: 20111130
Implantable Lead Implant Date: 20111130
Implantable Lead Location: 753859
Implantable Lead Location: 753860
Implantable Pulse Generator Implant Date: 20231027
Lead Channel Impedance Value: 360 Ohm
Lead Channel Impedance Value: 430 Ohm
Lead Channel Pacing Threshold Amplitude: 0.75 V
Lead Channel Pacing Threshold Amplitude: 1.625 V
Lead Channel Pacing Threshold Pulse Width: 0.4 ms
Lead Channel Pacing Threshold Pulse Width: 0.4 ms
Lead Channel Sensing Intrinsic Amplitude: 3 mV
Lead Channel Sensing Intrinsic Amplitude: 4.6 mV
Lead Channel Setting Pacing Amplitude: 1.875
Lead Channel Setting Pacing Amplitude: 2 V
Lead Channel Setting Pacing Pulse Width: 0.4 ms
Lead Channel Setting Sensing Sensitivity: 2 mV
Pulse Gen Model: 2272
Pulse Gen Serial Number: 8125938

## 2023-01-10 NOTE — Progress Notes (Signed)
HPI [name] returns today for followup. She has a h/o sinus node dysfunction, s/p PPM insertion. She reached ERI and underwent gen change about 3 months ago. She denies chest pain or sob. She has had occaisional episodes where she feels like she has been awakened with palpitations. She stopped smoking about 6 months ago.  Allergies  Allergen Reactions   Misc. Sulfonamide Containing Compounds Nausea And Vomiting   Codeine Other (See Comments)    Migraines   Sulfonamide Derivatives Nausea And Vomiting   Betadine [Povidone Iodine] Other (See Comments)    Blisters    Paxil [Paroxetine Hcl] Other (See Comments)    Extreme sweating   Vioxx [Rofecoxib] Rash     Current Outpatient Medications  Medication Sig Dispense Refill   albuterol (VENTOLIN HFA) 108 (90 Base) MCG/ACT inhaler INHALE 2 PUFFS BY MOUTH EVERY 4 HOURS AS NEEDED FOR WHEEZING OR SHORTNESS OF BREATH 18 g 5   ALPRAZolam (XANAX) 0.5 MG tablet Take 1 tablet (0.5 mg total) by mouth 2 (two) times daily as needed for anxiety. TAKE 1/2 (ONE-HALF) TABLET BY MOUTH TWICE DAILY AS NEEDED FOR ANXIETY OR  SLEEP (Patient taking differently: Take 0.5 mg by mouth 2 (two) times daily as needed for anxiety or sleep. TAKE 1/2 (ONE-HALF) TABLET BY MOUTH TWICE DAILY AS NEEDED FOR ANXIETY OR  SLEEP) 30 tablet 0   buPROPion (WELLBUTRIN SR) 150 MG 12 hr tablet Take 150 mg by mouth 2 (two) times daily.     fluticasone (FLONASE) 50 MCG/ACT nasal spray Place 2 sprays into both nostrils daily. (Patient taking differently: Place 2 sprays into both nostrils daily as needed for allergies.) 16 g 1   levothyroxine (SYNTHROID) 100 MCG tablet Take 100 mcg by mouth daily.     omeprazole (PRILOSEC) 20 MG capsule Take 1 capsule (20 mg total) by mouth 2 (two) times daily. (Patient taking differently: Take 20 mg by mouth 2 (two) times daily as needed (Heartburn).) 60 capsule 5   rosuvastatin (CRESTOR) 10 MG tablet TAKE 1 TABLET BY MOUTH ONCE DAILY FOR CHOLESTEROL  90 tablet 1   Vitamin D, Ergocalciferol, (DRISDOL) 1.25 MG (50000 UNIT) CAPS capsule Take 1 capsule (50,000 Units total) by mouth every 7 (seven) days. 4 capsule 0   No current facility-administered medications for this visit.     Past Medical History:  Diagnosis Date   Allergy    Anxiety    Arthritis    Asthma    Cataract    Depression    bipolar disorder   Emphysema, unspecified ([place])    Esophageal stricture    GERD (gastroesophageal reflux disease)    Heart defect    HLD (hyperlipidemia)    Hypothyroidism    IBS (irritable bowel syndrome)    IFG (impaired fasting glucose)    Migraine headache    Obesity    OSA (obstructive sleep apnea)    Pacemaker    Prediabetes    Reflux    Sleep apnea    SOB (shortness of breath)    Symptomatic bradycardia     ROS:   All systems reviewed and negative except as noted in the HPI.   Past Surgical History:  Procedure Laterality Date   ABDOMINAL HERNIA REPAIR     BREAST CYST EXCISION     left   Cesarean sections     x2   CHOLECYSTECTOMY     COLONOSCOPY     dental implants     foor surgery Right  screws    FOOT SURGERY     screws in left foot   PACEMAKER INSERTION     PPM GENERATOR CHANGEOUT N/A 10/07/2022   Procedure: PPM GENERATOR CHANGEOUT;  Surgeon: Evans Lance, MD;  Location: [facility];  Service: Cardiovascular;  Laterality: N/A;   TUBAL LIGATION     VAGINAL HYSTERECTOMY       Family History  Problem Relation Age of Onset   Arthritis Mother    Heart disease Mother        coronary; female < 46   Asthma Mother    High Cholesterol Mother    Thyroid disease Mother    Depression Mother    Bipolar disorder Mother    Throat cancer Father        squamous cell carcinoma   Tongue cancer Father    Esophageal cancer Father    Depression Father    Colon cancer Neg Hx    Rectal cancer Neg Hx    Stomach cancer Neg Hx      Social History   Socioeconomic History   Marital status: Married     Spouse name: Jeneen Rinks   Number of children: 2   Years of education: Not on file   Highest education level: Not on file  Occupational History   Occupation: retired    Fish farm manager: [employer]   Occupation: Nana, Housewife  Tobacco Use   Smoking status: Former    Packs/day: 1.50    Years: 60.00    Total pack years: 90.00    Types: Cigarettes    Quit date: 07/08/2022    Years since quitting: 0.5   Smokeless tobacco: Never  Vaping Use   Vaping Use: Former  Substance and Sexual Activity   Alcohol use: No    Alcohol/week: 0.0 standard drinks of alcohol    Comment: Occ.    Drug use: No   Sexual activity: Not on file  Other Topics Concern   Not on file  Social History Narrative   Unemployed. 10 cans of soda per day.    Social Determinants of Health   Financial Resource Strain: Not on file  Food Insecurity: Not on file  Transportation Needs: Not on file  Physical Activity: Not on file  Stress: Not on file  Social Connections: Not on file  Intimate Partner Violence: Not on file     BP (!) 142/78 Comment: lt arm  Pulse 63   Resp 20   Wt 218 lb 0.8 oz (98.9 kg)   SpO2 97%   BMI 36.29 kg/m   Physical Exam:  Well appearing NAD HEENT: Unremarkable Neck:  No JVD, no thyromegally Lymphatics:  No adenopathy Back:  No CVA tenderness Lungs:  Clear with no wheezes HEART:  Regular rate rhythm, no murmurs, no rubs, no clicks Abd:  soft, positive bowel sounds, no organomegally, no rebound, no guarding Ext:  2 plus pulses, no edema, no cyanosis, no clubbing Skin:  No rashes no nodules Neuro:  CN II through XII intact, motor grossly intact  EKG - nsr  DEVICE  Normal device function.  See PaceArt for details.   Assess/Plan: 1. Obesity - I discussed the importance of weight loss. I asked her to lose 20 lbs. 2. HTN - her bp is reasonably well controlled. She will continue her current meds. 3. PAF - she has been out of rhythm none since her gen change. No indication for  anti-coagulation at this point.  4. 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 I encouraged her to avoid situations where she would want to smoke.   [name].D

## 2023-01-10 NOTE — Patient Instructions (Signed)
Medication Instructions:  Your physician recommends that you continue on your current medications as directed. Please refer to the Current Medication list given to you today.  *If you need a refill on your cardiac medications before your next appointment, please call your pharmacy*  Lab Work: None ordered.  If you have labs (blood work) drawn today and your tests are completely normal, you will receive your results only by: MyChart Message (if you have MyChart) OR A paper copy in the mail If you have any lab test that is abnormal or we need to change your treatment, we will call you to review the results.  Testing/Procedures: None ordered.  Follow-Up: At [facility], you and your health needs are our priority.  As part of our continuing mission to provide you with exceptional heart care, we have created designated Provider Care Teams.  These Care Teams include your primary Cardiologist (physician) and Advanced Practice Providers (APPs -  Physician Assistants and Nurse Practitioners) who all work together to provide you with the care you need, when you need it.  We recommend signing up for the patient portal called "MyChart".  Sign up information is provided on this After Visit Summary.  MyChart is used to connect with patients for Virtual Visits (Telemedicine).  Patients are able to view lab/test results, encounter notes, upcoming appointments, etc.  Non-urgent messages can be sent to your provider as well.   To learn more about what you can do with MyChart, go to [email or web address].    Your next appointment:   1 year(s)  The format for your next appointment:   In Person  Provider:   Gregg Taylor, MD{or one of the following Advanced Practice Providers on your designated Care Team:   Renee Ursuy, PA-C Michael "Andy" Tillery, PA-C  Remote monitoring is used to monitor your Pacemaker from home. This monitoring reduces the number of office visits required to check your device to  one time per year. It allows us to keep an eye on the functioning of your device to ensure it is working properly. You are scheduled for a device check from home on 04/10/23. You may send your transmission at any time that day. If you have a wireless device, the transmission will be sent automatically. After your physician reviews your transmission, you will receive a postcard with your next transmission date.         

## 2023-02-21 NOTE — Progress Notes (Signed)
Remote pacemaker transmission.   

## 2023-04-10 ENCOUNTER — Ambulatory Visit (INDEPENDENT_AMBULATORY_CARE_PROVIDER_SITE_OTHER): Payer: 59

## 2023-04-10 DIAGNOSIS — I442 Atrioventricular block, complete: Secondary | ICD-10-CM

## 2023-04-10 DIAGNOSIS — I4891 Unspecified atrial fibrillation: Secondary | ICD-10-CM

## 2023-04-11 LAB — CUP PACEART REMOTE DEVICE CHECK
Battery Remaining Longevity: 131 mo
Battery Remaining Percentage: 95.5 %
Battery Voltage: 3.02 V
Brady Statistic AP VP Percent: 1 %
Brady Statistic AP VS Percent: 1 %
Brady Statistic AS VP Percent: 1 %
Brady Statistic AS VS Percent: 99 %
Brady Statistic RA Percent Paced: 1 %
Brady Statistic RV Percent Paced: 1 %
Date Time Interrogation Session: 20240429033839
Implantable Lead Connection Status: 753985
Implantable Lead Connection Status: 753985
Implantable Lead Implant Date: 20111130
Implantable Lead Implant Date: 20111130
Implantable Lead Location: 753859
Implantable Lead Location: 753860
Implantable Pulse Generator Implant Date: 20231027
Lead Channel Impedance Value: 360 Ohm
Lead Channel Impedance Value: 380 Ohm
Lead Channel Pacing Threshold Amplitude: 0.75 V
Lead Channel Pacing Threshold Amplitude: 1.375 V
Lead Channel Pacing Threshold Pulse Width: 0.4 ms
Lead Channel Pacing Threshold Pulse Width: 0.4 ms
Lead Channel Sensing Intrinsic Amplitude: 2.6 mV
Lead Channel Sensing Intrinsic Amplitude: 4.8 mV
Lead Channel Setting Pacing Amplitude: 1.625
Lead Channel Setting Pacing Amplitude: 2 V
Lead Channel Setting Pacing Pulse Width: 0.4 ms
Lead Channel Setting Sensing Sensitivity: 2 mV
Pulse Gen Model: 2272
Pulse Gen Serial Number: 8125938

## 2023-05-11 NOTE — Progress Notes (Signed)
Remote pacemaker transmission.   

## 2023-06-01 DIAGNOSIS — H2513 Age-related nuclear cataract, bilateral: Secondary | ICD-10-CM | POA: Insufficient documentation

## 2023-06-01 DIAGNOSIS — H25013 Cortical age-related cataract, bilateral: Secondary | ICD-10-CM | POA: Insufficient documentation

## 2023-06-01 DIAGNOSIS — H5213 Myopia, bilateral: Secondary | ICD-10-CM | POA: Insufficient documentation

## 2023-06-02 ENCOUNTER — Ambulatory Visit (INDEPENDENT_AMBULATORY_CARE_PROVIDER_SITE_OTHER): Payer: 59 | Admitting: Internal Medicine

## 2023-06-02 ENCOUNTER — Other Ambulatory Visit: Payer: Self-pay | Admitting: Acute Care

## 2023-06-02 ENCOUNTER — Encounter: Payer: Self-pay | Admitting: Internal Medicine

## 2023-06-02 VITALS — BP 126/70 | HR 66 | Temp 97.9°F | Ht 65.0 in | Wt 222.6 lb

## 2023-06-02 DIAGNOSIS — Z87891 Personal history of nicotine dependence: Secondary | ICD-10-CM

## 2023-06-02 DIAGNOSIS — J439 Emphysema, unspecified: Secondary | ICD-10-CM

## 2023-06-02 DIAGNOSIS — Z122 Encounter for screening for malignant neoplasm of respiratory organs: Secondary | ICD-10-CM

## 2023-06-02 DIAGNOSIS — J4489 Other specified chronic obstructive pulmonary disease: Secondary | ICD-10-CM | POA: Diagnosis not present

## 2023-06-02 MED ORDER — ALBUTEROL SULFATE HFA 108 (90 BASE) MCG/ACT IN AERS: INHALATION_SPRAY | RESPIRATORY_TRACT | 5 refills | Status: AC

## 2023-06-02 NOTE — Patient Instructions (Addendum)
Please schedule follow up scheduled with myself in 1 year.  If my schedule is not open yet, we will contact you with a reminder closer to that time. Please call [phone] if you haven't heard from [place] a month before.   Before your next visit I would like you to have: CT Scan in September 2024 - will contact you with the results.   Take the albuterol rescue inhaler every 4 to 6 hours as needed for wheezing or shortness of breath. You can also take it 15 minutes before exercise or exertional activity. Side effects include heart racing or pounding, jitters or anxiety. If you have a history of an irregular heart rhythm, it can make this worse. Can also give some patients a hard time sleeping.

## 2023-06-02 NOTE — Progress Notes (Signed)
[name]    [id number]    1961/10/16  Primary Care Physician:Dinsbeer, Leotis Shames, FNP Date of Appointment: 06/03/2023 Established Patient Visit  Chief complaint:   Chief Complaint  Patient presents with   Follow-up    No c/o      HPI: [name] is a 62 y.o. woman with history of tobacco use disorder quit July 2023  and OSA not on cpap.   Interval Updates: Here for follow up after PFTs. She is having dyspnea with exertion - went to [place] and had trouble hiking. Wonders if she should be using rescue inhaler.  Stayed off cigarettes. No interval hospitalizations or ED visits.  At home taking care of 3 grandchildren to help her daughter.     I have reviewed the patient's family social and past medical history and updated as appropriate.   Past Medical History:  Diagnosis Date   Allergy    Anxiety    Arthritis    Asthma    Cataract    Depression    bipolar disorder   Emphysema, unspecified ([facility])    Esophageal stricture    GERD (gastroesophageal reflux disease)    Heart defect    HLD (hyperlipidemia)    Hypothyroidism    IBS (irritable bowel syndrome)    IFG (impaired fasting glucose)    Migraine headache    Obesity    OSA (obstructive sleep apnea)    Pacemaker    Prediabetes    Reflux    Sleep apnea    SOB (shortness of breath)    Symptomatic bradycardia     Past Surgical History:  Procedure Laterality Date   ABDOMINAL HERNIA REPAIR     BREAST CYST EXCISION     left   Cesarean sections     x2   CHOLECYSTECTOMY     COLONOSCOPY     dental implants     foor surgery Right    screws    FOOT SURGERY     screws in left foot   PACEMAKER INSERTION     PPM GENERATOR CHANGEOUT N/A 10/07/2022   Procedure: PPM GENERATOR CHANGEOUT;  Surgeon: Marinus Maw, MD;  Location: [facility];  Service: Cardiovascular;  Laterality: N/A;   TUBAL LIGATION     VAGINAL HYSTERECTOMY      Family History  Problem Relation Age of Onset    Arthritis Mother    Heart disease Mother        coronary; female < 13   Asthma Mother    High Cholesterol Mother    Thyroid disease Mother    Depression Mother    Bipolar disorder Mother    Throat cancer Father        squamous cell carcinoma   Tongue cancer Father    Esophageal cancer Father    Depression Father    Colon cancer Neg Hx    Rectal cancer Neg Hx    Stomach cancer Neg Hx     Social History   Occupational History   Occupation: retired    Associate Professor: [employer]   Occupation: Nana, Housewife  Tobacco Use   Smoking status: Former    Packs/day: 1.50    Years: 60.00    Additional pack years: 0.00    Total pack years: 90.00    Types: Cigarettes    Quit date: 07/08/2022    Years since quitting: 0.9   Smokeless tobacco: Never  Vaping Use  Vaping Use: Former  Substance and Sexual Activity   Alcohol use: No    Alcohol/week: 0.0 standard drinks of alcohol    Comment: Occ.    Drug use: No   Sexual activity: Not on file     Physical Exam: Blood pressure 126/70, pulse 66, temperature 97.9 F (36.6 C), temperature source Oral, height 5\' 5"  (1.651 m), weight 222 lb 9.6 oz (101 kg), SpO2 97 %.  Gen:      No acute distress Lungs:    ctab no wheeze CV:         RRR, no mrg, nonpitting le edema, abrasions noted from recent fall on LLE   Data Reviewed: Imaging: CT Chest Sept 2023 - no nodules or masses  PFTs:     Latest Ref Rng & Units 08/17/2022    2:41 PM  PFT Results  FVC-Pre L 3.01   FVC-Predicted Pre % 87   FVC-Post L 3.16   FVC-Predicted Post % 91   Pre FEV1/FVC % % 82   Post FEV1/FCV % % 79   FEV1-Pre L 2.46   FEV1-Predicted Pre % 91   FEV1-Post L 2.48   DLCO uncorrected ml/min/mmHg 25.19   DLCO UNC% % 119   DLCO corrected ml/min/mmHg 25.19   DLCO COR %Predicted % 119   DLVA Predicted % 116   TLC L 5.56   TLC % Predicted % 106   RV % Predicted % 121    I have personally reviewed the patient's PFTs and normal pft  Labs:  Immunization  status: Immunization History  Administered Date(s) Administered   Influenza Split 09/04/2019   Influenza, Quadrivalent, Recombinant, Inj, Pf 08/25/2019   Influenza,inj,Quad PF,6+ Mos 12/04/2015, 10/17/2017, 09/26/2018   Influenza-Unspecified 09/12/2011, 09/11/2019   Moderna Sars-Covid-2 Vaccination 03/16/2020, 04/13/2020, 11/13/2020   Zoster Recombinat (Shingrix) 08/25/2019, 09/11/2019    External Records Personally Reviewed: lung cancer screening  Assessment:  COPD, Gold stage 0 Tobacco use disorder - cessation July 2023 OSA, not on cpap  Plan/Recommendations: Has not been taking albuterol prn. Will resume today. ordered Continue LDCT for lung cancer screening sept 2024.  No nocturnal hypoxemia to warrant nocturnal oxygen therapy for OSA unable to tolerate CPAP.    Return to Care: Return in about 1 year (around 06/01/2024).   Durel Salts, MD Pulmonary and Critical Care Medicine [facility] Office:[phone]

## 2023-07-10 ENCOUNTER — Ambulatory Visit (INDEPENDENT_AMBULATORY_CARE_PROVIDER_SITE_OTHER): Payer: 59

## 2023-07-10 DIAGNOSIS — I442 Atrioventricular block, complete: Secondary | ICD-10-CM

## 2023-07-10 LAB — CUP PACEART REMOTE DEVICE CHECK
Battery Remaining Longevity: 128 mo
Battery Remaining Percentage: 95.5 %
Battery Voltage: 3.02 V
Brady Statistic AP VP Percent: 1 %
Brady Statistic AP VS Percent: 1.6 %
Brady Statistic AS VP Percent: 1 %
Brady Statistic AS VS Percent: 98 %
Brady Statistic RA Percent Paced: 1.4 %
Brady Statistic RV Percent Paced: 1 %
Date Time Interrogation Session: 20240729022128
Implantable Lead Connection Status: 753985
Implantable Lead Connection Status: 753985
Implantable Lead Implant Date: 20111130
Implantable Lead Implant Date: 20111130
Implantable Lead Location: 753859
Implantable Lead Location: 753860
Implantable Pulse Generator Implant Date: 20231027
Lead Channel Impedance Value: 360 Ohm
Lead Channel Impedance Value: 380 Ohm
Lead Channel Pacing Threshold Amplitude: 0.75 V
Lead Channel Pacing Threshold Amplitude: 1.5 V
Lead Channel Pacing Threshold Pulse Width: 0.4 ms
Lead Channel Pacing Threshold Pulse Width: 0.4 ms
Lead Channel Sensing Intrinsic Amplitude: 2.4 mV
Lead Channel Sensing Intrinsic Amplitude: 5.4 mV
Lead Channel Setting Pacing Amplitude: 1.75 V
Lead Channel Setting Pacing Amplitude: 2 V
Lead Channel Setting Pacing Pulse Width: 0.4 ms
Lead Channel Setting Sensing Sensitivity: 2 mV
Pulse Gen Model: 2272
Pulse Gen Serial Number: 8125938

## 2023-07-26 NOTE — Progress Notes (Signed)
Remote pacemaker transmission.   

## 2023-08-21 ENCOUNTER — Ambulatory Visit (INDEPENDENT_AMBULATORY_CARE_PROVIDER_SITE_OTHER): Payer: 59

## 2023-08-21 DIAGNOSIS — Z87891 Personal history of nicotine dependence: Secondary | ICD-10-CM | POA: Diagnosis not present

## 2023-08-21 DIAGNOSIS — Z122 Encounter for screening for malignant neoplasm of respiratory organs: Secondary | ICD-10-CM

## 2023-09-04 ENCOUNTER — Other Ambulatory Visit: Payer: Self-pay | Admitting: Acute Care

## 2023-09-04 DIAGNOSIS — F17210 Nicotine dependence, cigarettes, uncomplicated: Secondary | ICD-10-CM

## 2023-09-04 DIAGNOSIS — Z122 Encounter for screening for malignant neoplasm of respiratory organs: Secondary | ICD-10-CM

## 2023-09-04 DIAGNOSIS — Z87891 Personal history of nicotine dependence: Secondary | ICD-10-CM

## 2023-10-09 ENCOUNTER — Ambulatory Visit: Payer: 59

## 2023-10-09 DIAGNOSIS — I442 Atrioventricular block, complete: Secondary | ICD-10-CM

## 2023-10-11 LAB — CUP PACEART REMOTE DEVICE CHECK
Battery Remaining Longevity: 125 mo
Battery Remaining Percentage: 95 %
Battery Voltage: 3.02 V
Brady Statistic AP VP Percent: 1 %
Brady Statistic AP VS Percent: 1.5 %
Brady Statistic AS VP Percent: 1 %
Brady Statistic AS VS Percent: 98 %
Brady Statistic RA Percent Paced: 1.3 %
Brady Statistic RV Percent Paced: 1 %
Date Time Interrogation Session: 20241028020014
Implantable Lead Connection Status: 753985
Implantable Lead Connection Status: 753985
Implantable Lead Implant Date: 20111130
Implantable Lead Implant Date: 20111130
Implantable Lead Location: 753859
Implantable Lead Location: 753860
Implantable Pulse Generator Implant Date: 20231027
Lead Channel Impedance Value: 350 Ohm
Lead Channel Impedance Value: 360 Ohm
Lead Channel Pacing Threshold Amplitude: 0.75 V
Lead Channel Pacing Threshold Amplitude: 1.125 V
Lead Channel Pacing Threshold Pulse Width: 0.4 ms
Lead Channel Pacing Threshold Pulse Width: 0.4 ms
Lead Channel Sensing Intrinsic Amplitude: 2.5 mV
Lead Channel Sensing Intrinsic Amplitude: 4.3 mV
Lead Channel Setting Pacing Amplitude: 1.375
Lead Channel Setting Pacing Amplitude: 2 V
Lead Channel Setting Pacing Pulse Width: 0.4 ms
Lead Channel Setting Sensing Sensitivity: 2 mV
Pulse Gen Model: 2272
Pulse Gen Serial Number: 8125938

## 2023-10-26 NOTE — Progress Notes (Signed)
Remote pacemaker transmission.   

## 2023-11-03 ENCOUNTER — Ambulatory Visit (INDEPENDENT_AMBULATORY_CARE_PROVIDER_SITE_OTHER): Payer: 59 | Admitting: Family Medicine

## 2023-11-03 ENCOUNTER — Encounter: Payer: Self-pay | Admitting: Family Medicine

## 2023-11-03 VITALS — BP 123/80 | HR 72 | Temp 98.2°F | Ht 65.0 in | Wt 212.0 lb

## 2023-11-03 DIAGNOSIS — Z23 Encounter for immunization: Secondary | ICD-10-CM

## 2023-11-03 DIAGNOSIS — H25013 Cortical age-related cataract, bilateral: Secondary | ICD-10-CM

## 2023-11-03 DIAGNOSIS — F17200 Nicotine dependence, unspecified, uncomplicated: Secondary | ICD-10-CM

## 2023-11-03 DIAGNOSIS — J4520 Mild intermittent asthma, uncomplicated: Secondary | ICD-10-CM

## 2023-11-03 DIAGNOSIS — K219 Gastro-esophageal reflux disease without esophagitis: Secondary | ICD-10-CM

## 2023-11-03 DIAGNOSIS — Z79899 Other long term (current) drug therapy: Secondary | ICD-10-CM

## 2023-11-03 DIAGNOSIS — R7303 Prediabetes: Secondary | ICD-10-CM

## 2023-11-03 DIAGNOSIS — F419 Anxiety disorder, unspecified: Secondary | ICD-10-CM

## 2023-11-03 DIAGNOSIS — E039 Hypothyroidism, unspecified: Secondary | ICD-10-CM | POA: Diagnosis not present

## 2023-11-03 DIAGNOSIS — E782 Mixed hyperlipidemia: Secondary | ICD-10-CM

## 2023-11-03 DIAGNOSIS — E559 Vitamin D deficiency, unspecified: Secondary | ICD-10-CM | POA: Diagnosis not present

## 2023-11-03 LAB — BAYER DCA HB A1C WAIVED: HB A1C (BAYER DCA - WAIVED): 5.7 % — ABNORMAL HIGH (ref 4.8–5.6)

## 2023-11-03 MED ORDER — HYDROXYZINE PAMOATE 25 MG PO CAPS
25.0000 mg | ORAL_CAPSULE | Freq: Three times a day (TID) | ORAL | 1 refills | Status: DC | PRN
Start: 2023-11-03 — End: 2024-02-09

## 2023-11-03 NOTE — Progress Notes (Signed)
New Patient Office Visit  Subjective   Patient ID: [name], female    DOB: 1961-03-13  Age: 62 y.o. MRN: [id number]  CC:  Chief Complaint  Patient presents with   New Patient (Initial Visit)    Establish care Medication mgmt    HPI NAZARI TOURANGEAU presents to establish care Previous provider retired. She then moved care to [facility], but her husband is established at [facility] and she would like to not drive so far for care. She cares for her 3 grandchildren, 11, 79, and 65 months old.  She has a history of Complete HB, with ICD. Recent generator change 10/07/22. She has history of atrial fibrillation, she is not anticoagulated as she has not been out of rhythm on device checks since generator change. Establish with Cardiology Ladona Ridgel, MD. She has history of mild centrilobular and paraseptal emphysema and asthma. She is established with Celine Mans, MD for pulmonology. In addition, has history of esophageal strictures, she is not established with GI. She has history of hypothyroidism supplemented with levothyroxine. She is not established with endocrinology.  In addition, she has a history of anxiety with panic attacks and depression. She reports that she had one attempt to take her life in 2005. She reports that she lost her father and within one year lost her son in a car accident. States that her mother has history of bipolar disorder and had multiple attempts on her life. States that she does not want to be like her mother and has self-admitted herself to hospital for treatment. States that she has no events like this since that time. Denies thoughts of self harm. States that she would never do that to her grandchildren. States that she is doing well on Wellbutrin. She takes 1/2 xanax at night for sleep. States that she sometimes takes 1/2 tablet during the day if she is feeling anxious. She is interested in stopping xanax and switching to something else. She has not tried hydroxyzine in the past.    HLD Compliant with Crestor, denies any side effects   Thyroid: Current symptoms include fatigue, weight gain, feeling cold and cold intolerance, constipation, anxiousness. Patient denies swelling, feeling excessive energy, tremulousness, palpitations, sweating, weight loss, diarrhea, change in skin,  nails, or hair, heat intolerance, depression, goiter, ocular symptoms.   Asthma/Emphysema/OSA   Only uses inhaler when needed. Rarely 2-3x per year. Denies other shortness of breath or trouble breathing. States that she was not able to tolerate CPAP.   GERD  States that she is taking prilosec once per day as needed. Avoids spicy foods.  Esophageal stretching twice in the past.  Adverse side effects - none Sore throat - no  Voice change - no Hemoptysis - no Dysphagia or dyspepsia - no Water brash - no Red Flags (weight loss, hematochezia, melena, weight loss, early satiety, fevers, odynophagia, or persistent vomiting) - no  Tobacco Use  States that she has relapse to smoking tobacco.  She is smoking 4 cigarettes per day.  Started at 62 years old  She has received CT Lung Cancer Screening   Prediabetes  States that she tries to control it with diet. She does not want to go on medication or to do injections.  Of note, recently had cataract surgery and is sensitive to light at this time.   Outpatient Encounter Medications as of 11/03/2023  Medication Sig   albuterol (VENTOLIN HFA) 108 (90 Base) MCG/ACT inhaler INHALE 2 PUFFS BY MOUTH EVERY 4 HOURS AS  NEEDED FOR WHEEZING OR SHORTNESS OF BREATH   ALPRAZolam (XANAX) 0.5 MG tablet Take 1 tablet (0.5 mg total) by mouth 2 (two) times daily as needed for anxiety. TAKE 1/2 (ONE-HALF) TABLET BY MOUTH TWICE DAILY AS NEEDED FOR ANXIETY OR  SLEEP (Patient taking differently: Take 0.5 mg by mouth 2 (two) times daily as needed for anxiety or sleep. TAKE 1/2 (ONE-HALF) TABLET BY MOUTH TWICE DAILY AS NEEDED FOR ANXIETY OR  SLEEP)   buPROPion  (WELLBUTRIN SR) 150 MG 12 hr tablet Take 150 mg by mouth 2 (two) times daily.   fluticasone (FLONASE) 50 MCG/ACT nasal spray Place 2 sprays into both nostrils daily. (Patient taking differently: Place 2 sprays into both nostrils daily as needed for allergies.)   levothyroxine (SYNTHROID) 100 MCG tablet Take 100 mcg by mouth daily.   omega-3 acid ethyl esters (LOVAZA) 1 g capsule Take by mouth.   omeprazole (PRILOSEC) 20 MG capsule Take 1 capsule (20 mg total) by mouth 2 (two) times daily. (Patient taking differently: Take 20 mg by mouth 2 (two) times daily as needed (Heartburn).)   prednisoLONE acetate (PRED FORTE) 1 % ophthalmic suspension Place 1 drop into the right eye 4 (four) times daily.   rosuvastatin (CRESTOR) 10 MG tablet TAKE 1 TABLET BY MOUTH ONCE DAILY FOR CHOLESTEROL   sertraline (ZOLOFT) 50 MG tablet Take by mouth.   Vitamin D, Ergocalciferol, (DRISDOL) 1.25 MG (50000 UNIT) CAPS capsule Take 1 capsule (50,000 Units total) by mouth every 7 (seven) days.   No facility-administered encounter medications on file as of 11/03/2023.    Past Medical History:  Diagnosis Date   Allergy    Anxiety    Arthritis    Asthma    Cataract    Depression    bipolar disorder   Emphysema, unspecified (HCC)    Esophageal stricture    GERD (gastroesophageal reflux disease)    Heart defect    HLD (hyperlipidemia)    Hypothyroidism    IBS (irritable bowel syndrome)    IFG (impaired fasting glucose)    Migraine headache    Obesity    OSA (obstructive sleep apnea)    Pacemaker    Prediabetes    Reflux    Sleep apnea    SOB (shortness of breath)    Symptomatic bradycardia     Past Surgical History:  Procedure Laterality Date   ABDOMINAL HERNIA REPAIR     BREAST CYST EXCISION     left   Cesarean sections     x2   CHOLECYSTECTOMY     COLONOSCOPY     dental implants     foor surgery Right    screws    FOOT SURGERY     screws in left foot   PACEMAKER INSERTION     PPM GENERATOR  CHANGEOUT N/A 10/07/2022   Procedure: PPM GENERATOR CHANGEOUT;  Surgeon: Marinus Maw, MD;  Location: [facility];  Service: Cardiovascular;  Laterality: N/A;   TUBAL LIGATION     VAGINAL HYSTERECTOMY      Family History  Problem Relation Age of Onset   Arthritis Mother    Heart disease Mother        coronary; female < 12   Asthma Mother    High Cholesterol Mother    Thyroid disease Mother    Depression Mother    Bipolar disorder Mother    Throat cancer Father        squamous cell carcinoma   Tongue cancer  Father    Esophageal cancer Father    Depression Father    Colon cancer Neg Hx    Rectal cancer Neg Hx    Stomach cancer Neg Hx     Social History   Socioeconomic History   Marital status: Married    Spouse name: Fayrene Fearing   Number of children: 2   Years of education: Not on file   Highest education level: Not on file  Occupational History   Occupation: retired    Associate Professor: [employer]   Occupation: Nana, Housewife  Tobacco Use   Smoking status: Former    Current packs/day: 0.00    Types: Cigarettes    Quit date: 07/08/2022    Years since quitting: 1.3   Smokeless tobacco: Never  Vaping Use   Vaping status: Former  Substance and Sexual Activity   Alcohol use: No    Alcohol/week: 0.0 standard drinks of alcohol    Comment: Occ.    Drug use: No   Sexual activity: Not on file  Other Topics Concern   Not on file  Social History Narrative   Unemployed. 10 cans of soda per day.    Social Determinants of Health   Financial Resource Strain: Low Risk  (04/04/2023)   Received from [facility], [facility]   Overall Financial Resource Strain (CARDIA)    Difficulty of Paying Living Expenses: Not very hard  Food Insecurity: No Food Insecurity (04/04/2023)   Received from [facility], [facility]   Hunger Vital Sign    Worried About Running Out of Food in the Last Year: Never true    Ran Out of Food in the Last Year: Never true  Transportation Needs: No  Transportation Needs (04/04/2023)   Received from [facility], [facility]   PRAPARE - Transportation    Lack of Transportation (Medical): No    Lack of Transportation (Non-Medical): No  Physical Activity: Unknown (04/04/2023)   Received from [facility], [facility]   Exercise Vital Sign    Days of Exercise per Week: 0 days    Minutes of Exercise per Session: Not on file  Stress: Stress Concern Present (04/04/2023)   Received from [facility], [facility] of Occupational Health - Occupational Stress Questionnaire    Feeling of Stress : To some extent  Social Connections: Somewhat Isolated (04/04/2023)   Received [facility], [facility]   Social Network    How would you rate your social network (family, work, friends)?: Restricted participation with some degree of social isolation  Intimate Partner Violence: Not At Risk (04/04/2023)   Received from [facility], [facility]   HITS    Over the last 12 months how often did your partner physically hurt you?: Never    Over the last 12 months how often did your partner insult you or talk down to you?: Never    Over the last 12 months how often did your partner threaten you with physical harm?: Never    Over the last 12 months how often did your partner scream or curse at you?: Rarely    ROS As per HPI  Objective   BP 123/80   Pulse 72   Temp 98.2 F (36.8 C)   Ht 5\' 5"  (1.651 m)   Wt 212 lb (96.2 kg)   SpO2 97%   BMI 35.28 kg/m   Physical Exam Constitutional:      General: She is awake. She is not in acute distress.  Appearance: Normal appearance. She is well-developed and well-groomed. She is obese. She is not ill-appearing, toxic-appearing or diaphoretic.  Eyes:     Comments: Sitting in darkened room/wearing sunglasses   Cardiovascular:     Rate and Rhythm: Normal rate and regular rhythm.     Pulses: Normal pulses.          Radial pulses are 2+ on the right side and 2+ on the  left side.       Posterior tibial pulses are 2+ on the right side and 2+ on the left side.     Heart sounds: Normal heart sounds. No murmur heard.    No gallop.  Pulmonary:     Effort: Pulmonary effort is normal. No respiratory distress.     Breath sounds: Normal breath sounds. No stridor. No wheezing, rhonchi or rales.  Musculoskeletal:     Cervical back: Full passive range of motion without pain and neck supple.     Right lower leg: No edema.     Left lower leg: No edema.  Skin:    General: Skin is warm.     Capillary Refill: Capillary refill takes less than 2 seconds.  Neurological:     General: No focal deficit present.     Mental Status: She is alert, oriented to person, place, and time and easily aroused. Mental status is at baseline.     GCS: GCS eye subscore is 4. GCS verbal subscore is 5. GCS motor subscore is 6.     Motor: No weakness.  Psychiatric:        Attention and Perception: Attention and perception normal.        Mood and Affect: Mood and affect normal.        Speech: Speech normal.        Behavior: Behavior normal. Behavior is cooperative.        Thought Content: Thought content normal. Thought content does not include homicidal or suicidal ideation. Thought content does not include homicidal or suicidal plan.        Cognition and Memory: Cognition and memory normal.        Judgment: Judgment normal.        11/03/2023    9:24 AM 10/06/2021    6:59 AM 10/26/2020    9:17 AM  Depression screen PHQ 2/9  Decreased Interest 0 2 0  Down, Depressed, Hopeless 0 2 0  PHQ - 2 Score 0 4 0  Altered sleeping 2 2   Tired, decreased energy 0 3   Change in appetite 0 1   Feeling bad or failure about yourself  0 1   Trouble concentrating 0 2   Moving slowly or fidgety/restless 0 0   Suicidal thoughts 0 0   PHQ-9 Score 2 13   Difficult doing work/chores  Somewhat difficult       11/03/2023    9:25 AM 06/15/2018    9:18 AM  GAD 7 : Generalized Anxiety Score   Nervous, Anxious, on Edge 1 1  Control/stop worrying 0 2  Worry too much - different things 1 2  Trouble relaxing 0 3  Restless 0 0  Easily annoyed or irritable 1 2  Afraid - awful might happen 0 0  Total GAD 7 Score 3 10  Anxiety Difficulty Not difficult at all Somewhat difficult   Assessment & Plan:  1. Vitamin D deficiency Labs as below. Will communicate results to patient once available. Will await results to determine next steps.  -  VITAMIN D 25 Hydroxy (Vit-D Deficiency, Fractures)  2. Gastroesophageal reflux disease without esophagitis Well controlled. Continue current regimen. Discussed red flag symptoms.   3. Mild intermittent asthma without complication Established with pulmonology. Reviewed notes from Celine Mans, MD on 06/02/23. Continue to follow up with specialty.   4. Acquired hypothyroidism Labs as below. Will communicate results to patient once available. Will await results to determine next steps.  - TSH  5. TOBACCO ABUSE Recent relapse. Encouraged patient to attempt cessation. Encouraged pat  6. Anxiety and depression Well controlled on current medication. Denies thoughts of self harm. States that she should like to transition off of xanax. Will start atarax as below. Discussed side effects. Patient on very low dose. Discussed withdrawal symptoms and when to monitor.  - hydrOXYzine (VISTARIL) 25 MG capsule; Take 1 capsule (25 mg total) by mouth every 8 (eight) hours as needed.  Dispense: 30 capsule; Refill: 1  7. Morbid obesity (HCC) Labs as below. Will communicate results to patient once available. Will await results to determine next steps.  Discussed with patient to continue healthy lifestyle choices, including diet (rich in fruits, vegetables, and lean proteins, and low in salt and simple carbohydrates) and exercise (at least 30 minutes of moderate physical activity daily). Limit beverages high is sugar. Recommended at least 80-100 oz of water daily.  - CBC with  Differential/Platelet - CMP14+EGFR  8. Prediabetes Discussed with patient to continue healthy lifestyle choices, including diet (rich in fruits, vegetables, and lean proteins, and low in salt and simple carbohydrates) and exercise (at least 30 minutes of moderate physical activity daily). Limit beverages high is sugar. Recommended at least 80-100 oz of water daily.  Labs as below. Will communicate results to patient once available. Will await results to determine next steps.  - Bayer DCA Hb A1c Waived - CBC with Differential/Platelet - CMP14+EGFR - Bayer DCA Hb A1c Waived  9. Mixed hyperlipidemia Labs as below. Will communicate results to patient once available. Will await results to determine next steps.  Tolerating crestor  Fasting other than [place]  - Lipid panel  10. Cortical age-related cataract of both eyes Patient established with ophthalmology. Patient to continue to follow up with specialty.   11. Long-term current use of benzodiazepine Labs as below. Will communicate results to patient once available. Will await results to determine next steps.  - ToxASSURE Select 13 (MW), Urine  12. Encounter for immunization - Flu vaccine trivalent PF, 6mos and older(Flulaval,Afluria,Fluarix,Fluzone) - Tdap vaccine greater than or equal to 7yo IM  The above assessment and management plan was discussed with the patient. The patient verbalized understanding of and has agreed to the management plan using shared-decision making. Patient is aware to call the clinic if they develop any new symptoms or if symptoms fail to improve or worsen. Patient is aware when to return to the clinic for a follow-up visit. Patient educated on when it is appropriate to go to the emergency department.   Return in about 3 months (around 02/03/2024) for Chronic Condition Follow up.   Neale Burly, DNP-FNP [facility] [address] [place], [place] [address] [phone]

## 2023-11-04 LAB — LIPID PANEL
Chol/HDL Ratio: 2.4 ratio (ref 0.0–4.4)
Cholesterol, Total: 127 mg/dL (ref 100–199)
HDL: 54 mg/dL (ref >39)
LDL Chol Calc (NIH): 53 mg/dL (ref 0–99)
Triglycerides: 109 mg/dL (ref 0–149)
VLDL Cholesterol Cal: 20 mg/dL (ref 5–40)

## 2023-11-04 LAB — CBC WITH DIFFERENTIAL/PLATELET
Basophils Absolute: 0.1 10*3/uL (ref 0.0–0.2)
Basos: 1 %
EOS (ABSOLUTE): 0.1 10*3/uL (ref 0.0–0.4)
Eos: 1 %
Hematocrit: 43.9 % (ref 34.0–46.6)
Hemoglobin: 14.2 g/dL (ref 11.1–15.9)
Immature Grans (Abs): 0.1 10*3/uL (ref 0.0–0.1)
Immature Granulocytes: 1 %
Lymphocytes Absolute: 2 10*3/uL (ref 0.7–3.1)
Lymphs: 24 %
MCH: 30.4 pg (ref 26.6–33.0)
MCHC: 32.3 g/dL (ref 31.5–35.7)
MCV: 94 fL (ref 79–97)
Monocytes Absolute: 0.7 10*3/uL (ref 0.1–0.9)
Monocytes: 8 %
Neutrophils Absolute: 5.6 10*3/uL (ref 1.4–7.0)
Neutrophils: 65 %
Platelets: 411 10*3/uL (ref 150–450)
RBC: 4.67 x10E6/uL (ref 3.77–5.28)
RDW: 13.2 % (ref 11.7–15.4)
WBC: 8.5 10*3/uL (ref 3.4–10.8)

## 2023-11-04 LAB — CMP14+EGFR
ALT: 16 [IU]/L (ref 0–32)
AST: 16 [IU]/L (ref 0–40)
Albumin: 4.2 g/dL (ref 3.8–4.9)
Alkaline Phosphatase: 94 [IU]/L (ref 44–121)
BUN/Creatinine Ratio: 6 — ABNORMAL LOW (ref 12–28)
BUN: 6 mg/dL — ABNORMAL LOW (ref 8–27)
Bilirubin Total: 0.3 mg/dL (ref 0.0–1.2)
CO2: 24 mmol/L (ref 20–29)
Calcium: 9.5 mg/dL (ref 8.7–10.3)
Chloride: 102 mmol/L (ref 96–106)
Creatinine, Ser: 0.96 mg/dL (ref 0.57–1.00)
Globulin, Total: 2.1 g/dL (ref 1.5–4.5)
Glucose: 80 mg/dL (ref 70–99)
Potassium: 3.6 mmol/L (ref 3.5–5.2)
Sodium: 141 mmol/L (ref 134–144)
Total Protein: 6.3 g/dL (ref 6.0–8.5)
eGFR: 68 mL/min/{1.73_m2} (ref >59)

## 2023-11-04 LAB — TSH: TSH: 2.26 u[IU]/mL (ref 0.450–4.500)

## 2023-11-04 LAB — VITAMIN D 25 HYDROXY (VIT D DEFICIENCY, FRACTURES): Vit D, 25-Hydroxy: 37.2 ng/mL (ref 30.0–100.0)

## 2023-11-07 LAB — TOXASSURE SELECT 13 (MW), URINE

## 2023-11-07 NOTE — Progress Notes (Signed)
Patient remains in prediabetes range. Recommend diet and lifestyle modifications. Slight variations on CMP, not concerning at this time. Cholesterol is very well controlled! Thyroid labs normal! Toxassure as expected. All other labs normal.

## 2023-11-24 DIAGNOSIS — H52203 Unspecified astigmatism, bilateral: Secondary | ICD-10-CM | POA: Insufficient documentation

## 2024-01-08 ENCOUNTER — Ambulatory Visit (INDEPENDENT_AMBULATORY_CARE_PROVIDER_SITE_OTHER): Payer: 59

## 2024-01-08 DIAGNOSIS — R001 Bradycardia, unspecified: Secondary | ICD-10-CM

## 2024-01-08 DIAGNOSIS — I442 Atrioventricular block, complete: Secondary | ICD-10-CM | POA: Diagnosis not present

## 2024-01-08 LAB — CUP PACEART REMOTE DEVICE CHECK
Battery Remaining Longevity: 123 mo
Battery Remaining Percentage: 93 %
Battery Voltage: 3.02 V
Brady Statistic AP VP Percent: 1 %
Brady Statistic AP VS Percent: 1.2 %
Brady Statistic AS VP Percent: 1 %
Brady Statistic AS VS Percent: 99 %
Brady Statistic RA Percent Paced: 1.1 %
Brady Statistic RV Percent Paced: 1 %
Date Time Interrogation Session: 20250127024925
Implantable Lead Connection Status: 753985
Implantable Lead Connection Status: 753985
Implantable Lead Implant Date: 20111130
Implantable Lead Implant Date: 20111130
Implantable Lead Location: 753859
Implantable Lead Location: 753860
Implantable Pulse Generator Implant Date: 20231027
Lead Channel Impedance Value: 360 Ohm
Lead Channel Impedance Value: 360 Ohm
Lead Channel Pacing Threshold Amplitude: 0.75 V
Lead Channel Pacing Threshold Amplitude: 1.5 V
Lead Channel Pacing Threshold Pulse Width: 0.4 ms
Lead Channel Pacing Threshold Pulse Width: 0.4 ms
Lead Channel Sensing Intrinsic Amplitude: 2.3 mV
Lead Channel Sensing Intrinsic Amplitude: 5.1 mV
Lead Channel Setting Pacing Amplitude: 1.75 V
Lead Channel Setting Pacing Amplitude: 2 V
Lead Channel Setting Pacing Pulse Width: 0.4 ms
Lead Channel Setting Sensing Sensitivity: 2 mV
Pulse Gen Model: 2272
Pulse Gen Serial Number: 8125938

## 2024-01-26 ENCOUNTER — Ambulatory Visit: Payer: 59 | Admitting: Family Medicine

## 2024-01-29 ENCOUNTER — Ambulatory Visit: Payer: 59 | Attending: Internal Medicine | Admitting: Internal Medicine

## 2024-01-29 ENCOUNTER — Encounter: Payer: Self-pay | Admitting: Internal Medicine

## 2024-01-29 VITALS — BP 124/90 | HR 92 | Ht 65.0 in | Wt 204.8 lb

## 2024-01-29 DIAGNOSIS — I442 Atrioventricular block, complete: Secondary | ICD-10-CM | POA: Diagnosis not present

## 2024-01-29 DIAGNOSIS — I4891 Unspecified atrial fibrillation: Secondary | ICD-10-CM

## 2024-01-29 NOTE — Patient Instructions (Signed)
 Medication Instructions:  Your physician recommends that you continue on your current medications as directed. Please refer to the Current Medication list given to you today.   Labwork: None today  Testing/Procedures: None today  Follow-Up: 1 year  Any Other Special Instructions Will Be Listed Below (If Applicable).  If you need a refill on your cardiac medications before your next appointment, please call your pharmacy.

## 2024-01-29 NOTE — Progress Notes (Signed)
HPI Mrs. Bronkema returns today for followup. She has a h/o sinus node dysfunction, s/p PPM insertion. She reached ERI and underwent gen change about 20 months ago. She denies chest pain or sob. She has travelled to [place] with no symptoms. She remains busy caring for 3 grandchildren.  Allergies  Allergen Reactions   Misc. Sulfonamide Containing Compounds Nausea And Vomiting and Other (See Comments)   Codeine Other (See Comments)    Migraines   Sulfonamide Derivatives Nausea And Vomiting   Betadine [Povidone Iodine] Other (See Comments)    Blisters    Paxil [Paroxetine Hcl] Other (See Comments)    Extreme sweating   Vioxx [Rofecoxib] Rash     Current Outpatient Medications  Medication Sig Dispense Refill   albuterol (VENTOLIN HFA) 108 (90 Base) MCG/ACT inhaler INHALE 2 PUFFS BY MOUTH EVERY 4 HOURS AS NEEDED FOR WHEEZING OR SHORTNESS OF BREATH 18 g 5   ALPRAZolam (XANAX) 0.5 MG tablet Take 1 tablet (0.5 mg total) by mouth 2 (two) times daily as needed for anxiety. TAKE 1/2 (ONE-HALF) TABLET BY MOUTH TWICE DAILY AS NEEDED FOR ANXIETY OR  SLEEP (Patient taking differently: Take 0.5 mg by mouth 2 (two) times daily as needed for anxiety or sleep. TAKE 1/2 (ONE-HALF) TABLET BY MOUTH TWICE DAILY AS NEEDED FOR ANXIETY OR  SLEEP) 30 tablet 0   buPROPion (WELLBUTRIN SR) 150 MG 12 hr tablet Take 150 mg by mouth 2 (two) times daily.     fluticasone (FLONASE) 50 MCG/ACT nasal spray Place 2 sprays into both nostrils daily. (Patient taking differently: Place 2 sprays into both nostrils daily as needed for allergies.) 16 g 1   hydrOXYzine (VISTARIL) 25 MG capsule Take 1 capsule (25 mg total) by mouth every 8 (eight) hours as needed. 30 capsule 1   levothyroxine (SYNTHROID) 100 MCG tablet Take 100 mcg by mouth daily.     omega-3 acid ethyl esters (LOVAZA) 1 g capsule Take by mouth.     omeprazole (PRILOSEC) 20 MG capsule Take 1 capsule (20 mg total) by mouth 2 (two) times daily. (Patient taking  differently: Take 20 mg by mouth 2 (two) times daily as needed (Heartburn).) 60 capsule 5   prednisoLONE acetate (PRED FORTE) 1 % ophthalmic suspension Place 1 drop into the right eye 4 (four) times daily.     rosuvastatin (CRESTOR) 10 MG tablet TAKE 1 TABLET BY MOUTH ONCE DAILY FOR CHOLESTEROL 90 tablet 1   sertraline (ZOLOFT) 50 MG tablet Take by mouth.     Vitamin D, Ergocalciferol, (DRISDOL) 1.25 MG (50000 UNIT) CAPS capsule Take 1 capsule (50,000 Units total) by mouth every 7 (seven) days. 4 capsule 0   No current facility-administered medications for this visit.     Past Medical History:  Diagnosis Date   Allergy    Anxiety    Arthritis    Asthma    Cataract    Depression    bipolar disorder   Emphysema, unspecified (HCC)    Esophageal stricture    GERD (gastroesophageal reflux disease)    Heart defect    HLD (hyperlipidemia)    Hypothyroidism    IBS (irritable bowel syndrome)    IFG (impaired fasting glucose)    Migraine headache    Obesity    OSA (obstructive sleep apnea)    Pacemaker    Prediabetes    Reflux    Sleep apnea    SOB (shortness of breath)    Symptomatic bradycardia  ROS:   All systems reviewed and negative except as noted in the HPI.   Past Surgical History:  Procedure Laterality Date   ABDOMINAL HERNIA REPAIR     BREAST CYST EXCISION     left   CATARACT EXTRACTION Bilateral    Cesarean sections     x2   CHOLECYSTECTOMY     COLONOSCOPY     dental implants     foor surgery Right    screws    FOOT SURGERY     screws in left foot   PACEMAKER INSERTION     PPM GENERATOR CHANGEOUT N/A 10/07/2022   Procedure: PPM GENERATOR CHANGEOUT;  Surgeon: Marinus Maw, MD;  Location: [facility];  Service: Cardiovascular;  Laterality: N/A;   TUBAL LIGATION     VAGINAL HYSTERECTOMY       Family History  Problem Relation Age of Onset   Arthritis Mother    Heart disease Mother        coronary; female < 83   Asthma Mother    High  Cholesterol Mother    Thyroid disease Mother    Depression Mother    Bipolar disorder Mother    Throat cancer Father        squamous cell carcinoma   Tongue cancer Father    Esophageal cancer Father    Depression Father    Colon cancer Neg Hx    Rectal cancer Neg Hx    Stomach cancer Neg Hx      Social History   Socioeconomic History   Marital status: Married    Spouse name: Fayrene Fearing   Number of children: 2   Years of education: Not on file   Highest education level: 12th grade  Occupational History   Occupation: retired    Associate Professor: [employer]   Occupation: Nana, Housewife  Tobacco Use   Smoking status: Former    Current packs/day: 0.00    Types: Cigarettes    Quit date: 07/08/2022    Years since quitting: 1.5   Smokeless tobacco: Never  Vaping Use   Vaping status: Former  Substance and Sexual Activity   Alcohol use: No    Alcohol/week: 0.0 standard drinks of alcohol    Comment: Occ.    Drug use: No   Sexual activity: Not on file  Other Topics Concern   Not on file  Social History Narrative   Unemployed. 10 cans of soda per day.    Social Drivers of Corporate investment banker Strain: Low Risk  (01/22/2024)   Overall Financial Resource Strain (CARDIA)    Difficulty of Paying Living Expenses: Not very hard  Food Insecurity: No Food Insecurity (01/22/2024)   Hunger Vital Sign    Worried About Running Out of Food in the Last Year: Never true    Ran Out of Food in the Last Year: Never true  Transportation Needs: No Transportation Needs (01/22/2024)   PRAPARE - Administrator, Civil Service (Medical): No    Lack of Transportation (Non-Medical): No  Physical Activity: Inactive (01/22/2024)   Exercise Vital Sign    Days of Exercise per Week: 1 day    Minutes of Exercise per Session: 0 min  Stress: Stress Concern Present (01/22/2024)   [employer] of Occupational Health - Occupational Stress Questionnaire    Feeling of Stress : Rather much  Social  Connections: Moderately Isolated (01/22/2024)   Social Connection and Isolation Panel [NHANES]    Frequency of Communication  with Friends and Family: Twice a week    Frequency of Social Gatherings with Friends and Family: Once a week    Attends Religious Services: Never    Database administrator or Organizations: No    Attends Engineer, structural: Not on file    Marital Status: Married  Catering manager Violence: Not At Risk (04/04/2023)   Received from [employer], [facility]   HITS    Over the last 12 months how often did your partner physically hurt you?: Never    Over the last 12 months how often did your partner insult you or talk down to you?: Never    Over the last 12 months how often did your partner threaten you with physical harm?: Never    Over the last 12 months how often did your partner scream or curse at you?: Rarely     BP (!) 124/90   Pulse 92   Ht 5\' 5"  (1.651 m)   Wt 204 lb 12.8 oz (92.9 kg)   SpO2 96%   BMI 34.08 kg/m   Physical Exam:  Well appearing NAD HEENT: Unremarkable Neck:  No JVD, no thyromegally Lymphatics:  No adenopathy Back:  No CVA tenderness Lungs:  Clear with no wheezes HEART:  Regular rate rhythm, no murmurs, no rubs, no clicks Abd:  soft, positive bowel sounds, no organomegally, no rebound, no guarding Ext:  2 plus pulses, no edema, no cyanosis, no clubbing Skin:  No rashes no nodules Neuro:  CN II through XII intact, motor grossly intact  EKG -  NSR  DEVICE  Normal device function.  See PaceArt for details.   Assess/Plan:  Obesity - I discussed the importance of weight loss. Her weight is down 14 lbs since her last visit. 2. HTN - her bp is reasonably well controlled. She will continue her current meds. 3. PAF - she has not been out of rhythm none since her gen change. No indication for anti-coagulation at this point. At 67 she will need to consider an OAC. 4. Tobacco abuse - she is in remission. I encouraged her to  avoid situations where she would want to smoke.   [name].D

## 2024-01-30 LAB — CUP PACEART INCLINIC DEVICE CHECK
Brady Statistic RA Percent Paced: 1 %
Brady Statistic RV Percent Paced: 1 % — CL
Date Time Interrogation Session: 20250217174148
Implantable Lead Connection Status: 753985
Implantable Lead Connection Status: 753985
Implantable Lead Implant Date: 20111130
Implantable Lead Implant Date: 20111130
Implantable Lead Location: 753859
Implantable Lead Location: 753860
Implantable Pulse Generator Implant Date: 20231027
Pulse Gen Model: 2272
Pulse Gen Serial Number: 8125938

## 2024-02-09 ENCOUNTER — Encounter: Payer: Self-pay | Admitting: Family Medicine

## 2024-02-09 ENCOUNTER — Ambulatory Visit (INDEPENDENT_AMBULATORY_CARE_PROVIDER_SITE_OTHER): Payer: 59 | Admitting: Family Medicine

## 2024-02-09 VITALS — BP 144/84 | HR 69 | Temp 98.2°F | Ht 65.0 in | Wt 205.0 lb

## 2024-02-09 DIAGNOSIS — R252 Cramp and spasm: Secondary | ICD-10-CM | POA: Diagnosis not present

## 2024-02-09 DIAGNOSIS — F419 Anxiety disorder, unspecified: Secondary | ICD-10-CM | POA: Diagnosis not present

## 2024-02-09 DIAGNOSIS — F331 Major depressive disorder, recurrent, moderate: Secondary | ICD-10-CM

## 2024-02-09 MED ORDER — BUSPIRONE HCL 5 MG PO TABS: 5.0000 mg | ORAL_TABLET | Freq: Three times a day (TID) | ORAL | 0 refills | Status: DC

## 2024-02-09 NOTE — Progress Notes (Signed)
 Subjective:  Patient ID: [name], female    DOB: 19-Jan-1961, 63 y.o.   MRN: [id number]  Patient Care Team: Arrie Senate, FNP as PCP - General (Family Medicine) Marinus Maw, MD as PCP - Electrophysiology (Cardiology)   Chief Complaint:  Anxiety (Follow up/Leg cramp/Toe locks up)  HPI: [name] is a 63 y.o. female presenting on 02/09/2024 for Anxiety (Follow up/Leg cramp/Toe locks up)  Anxiety  Switched from xanax to hydroxyzine in November of 2024. States that she had a headache every day with use of hydroxyzine. States that she keeps children and can get overwhelmed so she likes to have something that she can also take during the day. States that she was previously not taking wellbutrin with good compliance. She is trying to be better about taking it.  Left foot pain/Right toe pain States that she is waking up in the middle of the night with foot and toe cramps. Reports that left midfoot and right 5th toe will cramp. It happens multiple time throughout the day as well. It resolves with massage. Started 2-3 weeks ago.   Relevant past medical, surgical, family, and social history reviewed and updated as indicated.  Allergies and medications reviewed and updated. Data reviewed: Chart in Epic.   Past Medical History:  Diagnosis Date   Allergy    Anxiety    Arthritis    Asthma    Cataract    Depression    bipolar disorder   Emphysema, unspecified (HCC)    Esophageal stricture    GERD (gastroesophageal reflux disease)    Heart defect    HLD (hyperlipidemia)    Hypothyroidism    IBS (irritable bowel syndrome)    IFG (impaired fasting glucose)    Migraine headache    Obesity    OSA (obstructive sleep apnea)    Pacemaker    Prediabetes    Reflux    Sleep apnea    SOB (shortness of breath)    Symptomatic bradycardia     Past Surgical History:  Procedure Laterality Date   ABDOMINAL HERNIA REPAIR     BREAST CYST EXCISION     left   CATARACT  EXTRACTION Bilateral    Cesarean sections     x2   CHOLECYSTECTOMY     COLONOSCOPY     dental implants     foor surgery Right    screws    FOOT SURGERY     screws in left foot   PACEMAKER INSERTION     PPM GENERATOR CHANGEOUT N/A 10/07/2022   Procedure: PPM GENERATOR CHANGEOUT;  Surgeon: Marinus Maw, MD;  Location: [facility];  Service: Cardiovascular;  Laterality: N/A;   TUBAL LIGATION     VAGINAL HYSTERECTOMY      Social History   Socioeconomic History   Marital status: Married    Spouse name: Fayrene Fearing   Number of children: 2   Years of education: Not on file   Highest education level: 12th grade  Occupational History   Occupation: retired    Associate Professor: [employer]   Occupation: Nana, Housewife  Tobacco Use   Smoking status: Former    Current packs/day: 0.00    Types: Cigarettes    Quit date: 07/08/2022    Years since quitting: 1.5   Smokeless tobacco: Never  Vaping Use   Vaping status: Former  Substance and Sexual Activity   Alcohol use: No    Alcohol/week: 0.0 standard drinks of alcohol  Comment: Occ.    Drug use: No   Sexual activity: Not on file  Other Topics Concern   Not on file  Social History Narrative   Unemployed. 10 cans of soda per day.    Social Drivers of Corporate investment banker Strain: Low Risk  (01/22/2024)   Overall Financial Resource Strain (CARDIA)    Difficulty of Paying Living Expenses: Not very hard  Food Insecurity: No Food Insecurity (01/22/2024)   Hunger Vital Sign    Worried About Running Out of Food in the Last Year: Never true    Ran Out of Food in the Last Year: Never true  Transportation Needs: No Transportation Needs (01/22/2024)   PRAPARE - Administrator, Civil Service (Medical): No    Lack of Transportation (Non-Medical): No  Physical Activity: Inactive (01/22/2024)   Exercise Vital Sign    Days of Exercise per Week: 1 day    Minutes of Exercise per Session: 0 min  Stress: Stress Concern Present  (01/22/2024)   [employer] of Occupational Health - Occupational Stress Questionnaire    Feeling of Stress : Rather much  Social Connections: Moderately Isolated (01/22/2024)   Social Connection and Isolation Panel [NHANES]    Frequency of Communication with Friends and Family: Twice a week    Frequency of Social Gatherings with Friends and Family: Once a week    Attends Religious Services: Never    Database administrator or Organizations: No    Attends Engineer, structural: Not on file    Marital Status: Married  Catering manager Violence: Not At Risk (04/04/2023)   [facility]   HITS    Over the last 12 months how often did your partner physically hurt you?: Never    Over the last 12 months how often did your partner insult you or talk down to you?: Never    Over the last 12 months how often did your partner threaten you with physical harm?: Never    Over the last 12 months how often did your partner scream or curse at you?: Rarely    Outpatient Encounter Medications as of 02/09/2024  Medication Sig   albuterol (VENTOLIN HFA) 108 (90 Base) MCG/ACT inhaler INHALE 2 PUFFS BY MOUTH EVERY 4 HOURS AS NEEDED FOR WHEEZING OR SHORTNESS OF BREATH   ALPRAZolam (XANAX) 0.5 MG tablet Take 1 tablet (0.5 mg total) by mouth 2 (two) times daily as needed for anxiety. TAKE 1/2 (ONE-HALF) TABLET BY MOUTH TWICE DAILY AS NEEDED FOR ANXIETY OR  SLEEP (Patient taking differently: Take 0.5 mg by mouth 2 (two) times daily as needed for anxiety or sleep. TAKE 1/2 (ONE-HALF) TABLET BY MOUTH TWICE DAILY AS NEEDED FOR ANXIETY OR  SLEEP)   buPROPion (WELLBUTRIN SR) 150 MG 12 hr tablet Take 150 mg by mouth 2 (two) times daily.   fluticasone (FLONASE) 50 MCG/ACT nasal spray Place 2 sprays into both nostrils daily. (Patient taking differently: Place 2 sprays into both nostrils daily as needed for allergies.)   hydrOXYzine (VISTARIL) 25 MG capsule Take 1 capsule (25 mg total) by  mouth every 8 (eight) hours as needed.   levothyroxine (SYNTHROID) 100 MCG tablet Take 100 mcg by mouth daily.   omega-3 acid ethyl esters (LOVAZA) 1 g capsule Take by mouth.   omeprazole (PRILOSEC) 20 MG capsule Take 1 capsule (20 mg total) by mouth 2 (two) times daily. (Patient taking differently: Take 20 mg by mouth 2 (two)  times daily as needed (Heartburn).)   prednisoLONE acetate (PRED FORTE) 1 % ophthalmic suspension Place 1 drop into the right eye 4 (four) times daily.   rosuvastatin (CRESTOR) 10 MG tablet TAKE 1 TABLET BY MOUTH ONCE DAILY FOR CHOLESTEROL   sertraline (ZOLOFT) 50 MG tablet Take by mouth.   Vitamin D, Ergocalciferol, (DRISDOL) 1.25 MG (50000 UNIT) CAPS capsule Take 1 capsule (50,000 Units total) by mouth every 7 (seven) days.   No facility-administered encounter medications on file as of 02/09/2024.    Allergies  Allergen Reactions   Misc. Sulfonamide Containing Compounds Nausea And Vomiting and Other (See Comments)   Codeine Other (See Comments)    Migraines   Sulfonamide Derivatives Nausea And Vomiting   Betadine [Povidone Iodine] Other (See Comments)    Blisters    Paxil [Paroxetine Hcl] Other (See Comments)    Extreme sweating   Vioxx [Rofecoxib] Rash    Review of Systems As per HPI  Objective:  BP (!) 144/84   Pulse 69   Temp 98.2 F (36.8 C)   Ht 5\' 5"  (1.651 m)   Wt 205 lb (93 kg)   SpO2 97%   BMI 34.11 kg/m    Wt Readings from Last 3 Encounters:  02/09/24 205 lb (93 kg)  01/29/24 204 lb 12.8 oz (92.9 kg)  11/03/23 212 lb (96.2 kg)    Physical Exam Constitutional:      General: She is awake. She is not in acute distress.    Appearance: Normal appearance. She is well-developed and well-groomed. She is not ill-appearing, toxic-appearing or diaphoretic.  Cardiovascular:     Rate and Rhythm: Normal rate and regular rhythm.     Pulses: Normal pulses.          Radial pulses are 2+ on the right side and 2+ on the left side.       Posterior  tibial pulses are 2+ on the right side and 2+ on the left side.     Heart sounds: Normal heart sounds. No murmur heard.    No gallop.  Pulmonary:     Effort: Pulmonary effort is normal. No respiratory distress.     Breath sounds: Normal breath sounds. No stridor. No wheezing, rhonchi or rales.  Musculoskeletal:     Cervical back: Full passive range of motion without pain and neck supple.     Right lower leg: No edema.     Left lower leg: No edema.  Skin:    General: Skin is warm.     Capillary Refill: Capillary refill takes less than 2 seconds.  Neurological:     General: No focal deficit present.     Mental Status: She is alert, oriented to person, place, and time and easily aroused. Mental status is at baseline.     GCS: GCS eye subscore is 4. GCS verbal subscore is 5. GCS motor subscore is 6.     Motor: No weakness.  Psychiatric:        Attention and Perception: Attention and perception normal.        Mood and Affect: Mood and affect normal.        Speech: Speech normal.        Behavior: Behavior normal. Behavior is cooperative.        Thought Content: Thought content normal. Thought content does not include homicidal or suicidal ideation. Thought content does not include homicidal or suicidal plan.        Cognition and Memory: Cognition and memory  normal.        Judgment: Judgment normal.     Results for orders placed or performed in visit on 01/29/24  CUP PACEART INCLINIC DEVICE CHECK   Collection Time: 01/29/24  5:41 PM  Result Value Ref Range   Pulse Generator Manufacturer [employer]    Date Time Interrogation Session [id number]    Pulse Gen Model 2272 Assurity MRI    Pulse Gen Serial Number [id number]    Clinic Name [facility]    Implantable Pulse Generator Type Implantable Pulse Generator    Implantable Pulse Generator Implant Date [id number]    Implantable Lead Manufacturer [facility]    Implantable Lead Model [id number] Tendril STS    Implantable Lead Serial Number  [id number]    Implantable Lead Implant Date [id number]    Implantable Lead Location [id number]    Implantable Lead Connection Status [id number]    Implantable Lead Manufacturer [facility]    Implantable Lead Model [id number] Tendril STS    Implantable Lead Serial Number [id number]    Implantable Lead Implant Date [id number]    Implantable Lead Location [id number]    Implantable Lead Connection Status [id number]    Huston Foley Statistic RA Percent Paced 1.0 %   Brady Statistic RV Percent Paced 1 (<) %       02/09/2024   10:31 AM 11/03/2023    9:24 AM 10/06/2021    6:59 AM 10/26/2020    9:17 AM 06/15/2018    9:18 AM  Depression screen PHQ 2/9  Decreased Interest 0 0 2 0 2  Down, Depressed, Hopeless 0 0 2 0 1  PHQ - 2 Score 0 0 4 0 3  Altered sleeping 2 2 2  3   Tired, decreased energy 1 0 3  3  Change in appetite 0 0 1  2  Feeling bad or failure about yourself  0 0 1  1  Trouble concentrating 0 0 2  1  Moving slowly or fidgety/restless 0 0 0  0  Suicidal thoughts 0 0 0  0  PHQ-9 Score 3 2 13  13   Difficult doing work/chores Not difficult at all  Somewhat difficult         02/09/2024   10:32 AM 11/03/2023    9:25 AM 06/15/2018    9:18 AM  GAD 7 : Generalized Anxiety Score  Nervous, Anxious, on Edge 2 1 1   Control/stop worrying 0 0 2  Worry too much - different things 0 1 2  Trouble relaxing 0 0 3  Restless 0 0 0  Easily annoyed or irritable 2 1 2   Afraid - awful might happen 0 0 0  Total GAD 7 Score 4 3 10   Anxiety Difficulty Not difficult at all Not difficult at all Somewhat difficult   Pertinent labs & imaging results that were available during my care of the patient were reviewed by me and considered in my medical decision making.  Assessment & Plan:  [place] was seen today for anxiety.  Diagnoses and all orders for this visit:  Moderate episode of recurrent major depressive disorder (HCC) Will stop hydroxyzine and start medication as below. Discussed side effects. Denies SI. Safety contract  established.  -     busPIRone (BUSPAR) 5 MG tablet; Take 1 tablet (5 mg total) by mouth 3 (three) times daily.  Anxiety -     busPIRone (BUSPAR) 5 MG tablet; Take 1 tablet (5 mg total) by mouth 3 (three) times daily.  Foot cramps Labs as below. Will communicate results  to patient once available. Will await results to determine next steps.  Discussed maintaining hydration.  -     CMP14+EGFR -     CBC with Differential/Platelet -     Magnesium -     VITAMIN D 25 Hydroxy (Vit-D Deficiency, Fractures)  Continue all other maintenance medications.  Follow up plan: Return in about 4 weeks (around 03/08/2024) for Chronic Condition Follow up.  Continue healthy lifestyle choices, including diet (rich in fruits, vegetables, and lean proteins, and low in salt and simple carbohydrates) and exercise (at least 30 minutes of moderate physical activity daily).  Written and verbal instructions provided   The above assessment and management plan was discussed with the patient. The patient verbalized understanding of and has agreed to the management plan. Patient is aware to call the clinic if they develop any new symptoms or if symptoms persist or worsen. Patient is aware when to return to the clinic for a follow-up visit. Patient educated on when it is appropriate to go to the emergency department.   Neale Burly, DNP-FNP [facility] [address] [place], [place] [address] [phone]

## 2024-02-09 NOTE — Patient Instructions (Signed)

## 2024-02-10 LAB — CBC WITH DIFFERENTIAL/PLATELET
Basophils Absolute: 0.1 10*3/uL (ref 0.0–0.2)
Basos: 1 %
EOS (ABSOLUTE): 0.1 10*3/uL (ref 0.0–0.4)
Eos: 1 %
Hematocrit: 46.7 % — ABNORMAL HIGH (ref 34.0–46.6)
Hemoglobin: 15.5 g/dL (ref 11.1–15.9)
Immature Grans (Abs): 0.1 10*3/uL (ref 0.0–0.1)
Immature Granulocytes: 1 %
Lymphocytes Absolute: 2.1 10*3/uL (ref 0.7–3.1)
Lymphs: 22 %
MCH: 30.3 pg (ref 26.6–33.0)
MCHC: 33.2 g/dL (ref 31.5–35.7)
MCV: 91 fL (ref 79–97)
Monocytes Absolute: 0.7 10*3/uL (ref 0.1–0.9)
Monocytes: 7 %
Neutrophils Absolute: 6.7 10*3/uL (ref 1.4–7.0)
Neutrophils: 68 %
Platelets: 383 10*3/uL (ref 150–450)
RBC: 5.12 x10E6/uL (ref 3.77–5.28)
RDW: 12.4 % (ref 11.7–15.4)
WBC: 9.7 10*3/uL (ref 3.4–10.8)

## 2024-02-10 LAB — CMP14+EGFR
ALT: 19 IU/L (ref 0–32)
AST: 15 IU/L (ref 0–40)
Albumin: 4.2 g/dL (ref 3.9–4.9)
Alkaline Phosphatase: 106 IU/L (ref 44–121)
BUN/Creatinine Ratio: 9 — ABNORMAL LOW (ref 12–28)
BUN: 8 mg/dL (ref 8–27)
Bilirubin Total: 0.3 mg/dL (ref 0.0–1.2)
CO2: 26 mmol/L (ref 20–29)
Calcium: 9.9 mg/dL (ref 8.7–10.3)
Chloride: 103 mmol/L (ref 96–106)
Creatinine, Ser: 0.94 mg/dL (ref 0.57–1.00)
Globulin, Total: 2.3 g/dL (ref 1.5–4.5)
Glucose: 92 mg/dL (ref 70–99)
Potassium: 4.1 mmol/L (ref 3.5–5.2)
Sodium: 143 mmol/L (ref 134–144)
Total Protein: 6.5 g/dL (ref 6.0–8.5)
eGFR: 69 mL/min/{1.73_m2} (ref >59)

## 2024-02-10 LAB — MAGNESIUM: Magnesium: 2.1 mg/dL (ref 1.6–2.3)

## 2024-02-10 LAB — VITAMIN D 25 HYDROXY (VIT D DEFICIENCY, FRACTURES): Vit D, 25-Hydroxy: 59.5 ng/mL (ref 30.0–100.0)

## 2024-02-13 ENCOUNTER — Encounter: Payer: Self-pay | Admitting: Family Medicine

## 2024-02-13 NOTE — Progress Notes (Signed)
 Slight variation on BUN/Creatinine ratio and hematocrit, not concerning at this time.

## 2024-02-15 NOTE — Progress Notes (Signed)
 Remote pacemaker transmission.

## 2024-03-07 ENCOUNTER — Encounter: Payer: Self-pay | Admitting: Family Medicine

## 2024-03-07 ENCOUNTER — Telehealth (INDEPENDENT_AMBULATORY_CARE_PROVIDER_SITE_OTHER): Admitting: Family Medicine

## 2024-03-07 DIAGNOSIS — F5104 Psychophysiologic insomnia: Secondary | ICD-10-CM

## 2024-03-07 DIAGNOSIS — F419 Anxiety disorder, unspecified: Secondary | ICD-10-CM

## 2024-03-07 MED ORDER — TRAZODONE HCL 50 MG PO TABS: 25.0000 mg | ORAL_TABLET | Freq: Every evening | ORAL | 1 refills | Status: DC | PRN

## 2024-03-07 MED ORDER — ESCITALOPRAM OXALATE 10 MG PO TABS
10.0000 mg | ORAL_TABLET | Freq: Every day | ORAL | 1 refills | Status: DC
Start: 2024-03-07 — End: 2024-04-30

## 2024-03-07 NOTE — Progress Notes (Signed)
 Virtual Visit via Video Note  I connected with [name] on 03/07/24 at 10:05 AM EDT by a video enabled telemedicine application and verified that I am speaking with the correct person using two identifiers.  Patient Location: Home Provider Location: Office/Clinic  I discussed the limitations, risks, security, and privacy concerns of performing an evaluation and management service by video and the availability of in person appointments. I also discussed with the patient that there may be a patient responsible charge related to this service. The patient expressed understanding and agreed to proceed.  Subjective: PCP: Arrie Senate, FNP  Chief Complaint  Patient presents with   Anxiety   HPI  States that buspar is not helping. Reports that she is not sleeping well. Some nights not falling asleep until the early mornings and getting 1-2 hours of sleep. She has been feeling agitated and irritated during the day. She does not wish to take medications that will make her drowsy as she care for her grandchildren. Denies SI. She is not taking wellbutrin. She has not tried trazodone for sleep. She does not wish to take prozac as her mother took that and had a bad reaction.   ROS: Per HPI  Current Outpatient Medications:    albuterol (VENTOLIN HFA) 108 (90 Base) MCG/ACT inhaler, INHALE 2 PUFFS BY MOUTH EVERY 4 HOURS AS NEEDED FOR WHEEZING OR SHORTNESS OF BREATH, Disp: 18 g, Rfl: 5   ALPRAZolam (XANAX) 0.5 MG tablet, Take 1 tablet (0.5 mg total) by mouth 2 (two) times daily as needed for anxiety. TAKE 1/2 (ONE-HALF) TABLET BY MOUTH TWICE DAILY AS NEEDED FOR ANXIETY OR  SLEEP (Patient taking differently: Take 0.5 mg by mouth 2 (two) times daily as needed for anxiety or sleep. TAKE 1/2 (ONE-HALF) TABLET BY MOUTH TWICE DAILY AS NEEDED FOR ANXIETY OR  SLEEP), Disp: 30 tablet, Rfl: 0   buPROPion (WELLBUTRIN SR) 150 MG 12 hr tablet, Take 150 mg by mouth 2 (two) times daily., Disp: , Rfl:     busPIRone (BUSPAR) 5 MG tablet, Take 1 tablet (5 mg total) by mouth 3 (three) times daily., Disp: 90 tablet, Rfl: 0   fluticasone (FLONASE) 50 MCG/ACT nasal spray, Place 2 sprays into both nostrils daily. (Patient taking differently: Place 2 sprays into both nostrils daily as needed for allergies.), Disp: 16 g, Rfl: 1   levothyroxine (SYNTHROID) 100 MCG tablet, Take 100 mcg by mouth daily., Disp: , Rfl:    omega-3 acid ethyl esters (LOVAZA) 1 g capsule, Take by mouth., Disp: , Rfl:    omeprazole (PRILOSEC) 20 MG capsule, Take 1 capsule (20 mg total) by mouth 2 (two) times daily. (Patient taking differently: Take 20 mg by mouth 2 (two) times daily as needed (Heartburn).), Disp: 60 capsule, Rfl: 5   prednisoLONE acetate (PRED FORTE) 1 % ophthalmic suspension, Place 1 drop into the right eye 4 (four) times daily., Disp: , Rfl:    rosuvastatin (CRESTOR) 10 MG tablet, TAKE 1 TABLET BY MOUTH ONCE DAILY FOR CHOLESTEROL, Disp: 90 tablet, Rfl: 1   sertraline (ZOLOFT) 50 MG tablet, Take by mouth., Disp: , Rfl:    Vitamin D, Ergocalciferol, (DRISDOL) 1.25 MG (50000 UNIT) CAPS capsule, Take 1 capsule (50,000 Units total) by mouth every 7 (seven) days., Disp: 4 capsule, Rfl: 0  Observations/Objective: There were no vitals filed for this visit. Physical Exam Constitutional:      General: She is awake. She is not in acute distress.    Appearance: Normal appearance.  She is well-developed and well-groomed. She is not ill-appearing, toxic-appearing or diaphoretic.  Pulmonary:     Effort: Pulmonary effort is normal.  Neurological:     General: No focal deficit present.     Mental Status: She is alert and oriented to person, place, and time.  Psychiatric:        Attention and Perception: Attention and perception normal.        Mood and Affect: Mood and affect normal.        Speech: Speech normal.        Behavior: Behavior normal. Behavior is cooperative.        Thought Content: Thought content normal.         Cognition and Memory: Cognition and memory normal.        Judgment: Judgment normal.    Assessment and Plan: 1. Anxiety (Primary) Will stop buspar. Do not restart wellbutrin. Will start medication as below. Denies SI. Discussed side effects. Will plan to complete CMP in 4 weeks to monitor sodium. Safety contract established.  - escitalopram (LEXAPRO) 10 MG tablet; Take 1 tablet (10 mg total) by mouth daily.  Dispense: 30 tablet; Refill: 1  2. Psychophysiological insomnia Will stop buspar. Will start medication as below. Denies SI. Discussed mechanism of action and side effects.  - traZODone (DESYREL) 50 MG tablet; Take 0.5-1 tablets (25-50 mg total) by mouth at bedtime as needed for sleep.  Dispense: 30 tablet; Refill: 1  Appt time 03/07/2024 10:05 AM Call duration: 00:09:02  Follow Up Instructions: Return in about 4 weeks (around 04/04/2024) for Chronic Condition Follow up.   I discussed the assessment and treatment plan with the patient. The patient was provided an opportunity to ask questions, and all were answered. The patient agreed with the plan and demonstrated an understanding of the instructions.   The patient was advised to call back or seek an in-person evaluation if the symptoms worsen or if the condition fails to improve as anticipated.  The above assessment and management plan was discussed with the patient. The patient verbalized understanding of and has agreed to the management plan.   Neale Burly, DNP-FNP [facility] [address] [place], [place] [address] [phone]

## 2024-03-08 ENCOUNTER — Telehealth: Payer: 59 | Admitting: Family Medicine

## 2024-04-05 ENCOUNTER — Encounter: Payer: Self-pay | Admitting: Family Medicine

## 2024-04-05 ENCOUNTER — Ambulatory Visit (INDEPENDENT_AMBULATORY_CARE_PROVIDER_SITE_OTHER): Admitting: Family Medicine

## 2024-04-05 VITALS — BP 129/75 | HR 54 | Temp 98.2°F | Ht 65.0 in | Wt 202.0 lb

## 2024-04-05 DIAGNOSIS — F17200 Nicotine dependence, unspecified, uncomplicated: Secondary | ICD-10-CM

## 2024-04-05 DIAGNOSIS — R7303 Prediabetes: Secondary | ICD-10-CM

## 2024-04-05 DIAGNOSIS — E039 Hypothyroidism, unspecified: Secondary | ICD-10-CM | POA: Diagnosis not present

## 2024-04-05 DIAGNOSIS — G43009 Migraine without aura, not intractable, without status migrainosus: Secondary | ICD-10-CM

## 2024-04-05 DIAGNOSIS — E782 Mixed hyperlipidemia: Secondary | ICD-10-CM

## 2024-04-05 DIAGNOSIS — J432 Centrilobular emphysema: Secondary | ICD-10-CM | POA: Diagnosis not present

## 2024-04-05 DIAGNOSIS — F419 Anxiety disorder, unspecified: Secondary | ICD-10-CM

## 2024-04-05 DIAGNOSIS — F32A Depression, unspecified: Secondary | ICD-10-CM

## 2024-04-05 DIAGNOSIS — J4520 Mild intermittent asthma, uncomplicated: Secondary | ICD-10-CM

## 2024-04-05 DIAGNOSIS — K219 Gastro-esophageal reflux disease without esophagitis: Secondary | ICD-10-CM

## 2024-04-05 DIAGNOSIS — E559 Vitamin D deficiency, unspecified: Secondary | ICD-10-CM

## 2024-04-05 LAB — BAYER DCA HB A1C WAIVED: HB A1C (BAYER DCA - WAIVED): 5.5 % (ref 4.8–5.6)

## 2024-04-05 MED ORDER — BREZTRI AEROSPHERE 160-9-4.8 MCG/ACT IN AERO: 2.0000 | INHALATION_SPRAY | Freq: Two times a day (BID) | RESPIRATORY_TRACT | 11 refills | Status: DC

## 2024-04-05 NOTE — Progress Notes (Signed)
 Subjective:  Patient ID: [name], female    DOB: 1961-08-10, 63 y.o.   MRN: [id number]  Patient Care Team: Chrystine Crate, FNP as PCP - General (Family Medicine) Tammie Fall, MD as PCP - Electrophysiology (Cardiology)   Chief Complaint:  Medical Management of Chronic Issues   HPI: [name] is a 63 y.o. female presenting on 04/05/2024 for Medical Management of Chronic Issues  HPI Hypothyroidism, unspecified type States that she fluctuates between constipated and having diarrhea. She denies palpitations, tremulousness, hair loss, cold/heat intolerance.   Mixed hyperlipidemia She is established with cardiology. She is taking crestor . Denies side effects.   Morbid obesity (HCC)/Prediabetes States that she is not currently working on diet and lifestyle as she is taking care of multiple other family members.   Vitamin D  deficiency States that sometimes she will get cramps in her feet at night. She denies numbness or tingling. Denies bone fractures or pain. She is taking a 50000 international units supplement.   Migraine without aura and without status migrainosus, not intractable Reports that she is getting less than 15 headaches per month. They are well controlled with OTC analgesics. Reports that she can manage with getting in a dark room and sleeping it off.   Mild intermittent asthma without complication/Emphysema  States that she is using albuterol  when she is over exerted. States that it typically happens with stairs. She is using inhaler a few times per month. She is not currently taking maintenance medication for COPD/emphysema. She has follow up CT for lung cancer screening pending.   Gastroesophageal reflux disease without esophagitis Current medications - prilosec  Adverse side effects - none Sore throat - no Voice change - no Hemoptysis - no Dysphagia or dyspepsia - no Water brash - no Red Flags (weight loss, hematochezia, melena, weight  loss, early satiety, fevers, odynophagia, or persistent vomiting) - no Has history of two esophageal dilations   Anxiety and depression States that things are not great right now because she is taking care of her husband from surgery while taking care of her grandchildren. She is very stressed. In addition, she is coming up on the anniversary of her son's death. He passed on Mother's day so this is a very hard time for her. Trazodone  is not working for her at this time. She would like to increase the dose.   11. TOBACCO ABUSE Still smoking periodically.  States that she smokes 2-3 cigarettes per day.  Smoking 2 PPD at one point  LDCT has been ordered, but she has not completed it yet.    Relevant past medical, surgical, family, and social history reviewed and updated as indicated.  Allergies and medications reviewed and updated. Data reviewed: Chart in Epic.   Past Medical History:  Diagnosis Date   Allergy    Anxiety    Arthritis    Asthma    Cataract    Depression    bipolar disorder   Emphysema, unspecified (HCC)    Esophageal stricture    GERD (gastroesophageal reflux disease)    Heart defect    HLD (hyperlipidemia)    Hypothyroidism    IBS (irritable bowel syndrome)    IFG (impaired fasting glucose)    Migraine headache    Obesity    OSA (obstructive sleep apnea)    Pacemaker    Prediabetes    Reflux    Sleep apnea    SOB (shortness of breath)    Symptomatic bradycardia  Past Surgical History:  Procedure Laterality Date   ABDOMINAL HERNIA REPAIR     BREAST CYST EXCISION     left   CATARACT EXTRACTION Bilateral    Cesarean sections     x2   CHOLECYSTECTOMY     COLONOSCOPY     dental implants     foor surgery Right    screws    FOOT SURGERY     screws in left foot   PACEMAKER INSERTION     PPM GENERATOR CHANGEOUT N/A 10/07/2022   Procedure: PPM GENERATOR CHANGEOUT;  Surgeon: Tammie Fall, MD;  Location: [facility];  Service:  Cardiovascular;  Laterality: N/A;   TUBAL LIGATION     VAGINAL HYSTERECTOMY      Social History   Socioeconomic History   Marital status: Married    Spouse name: Royston Cornea   Number of children: 2   Years of education: Not on file   Highest education level: 12th grade  Occupational History   Occupation: retired    Associate Professor: [employer]   Occupation: Nana, Housewife  Tobacco Use   Smoking status: Former    Current packs/day: 0.00    Types: Cigarettes    Quit date: 07/08/2022    Years since quitting: 1.7   Smokeless tobacco: Never  Vaping Use   Vaping status: Former  Substance and Sexual Activity   Alcohol use: No    Alcohol/week: 0.0 standard drinks of alcohol    Comment: Occ.    Drug use: No   Sexual activity: Not on file  Other Topics Concern   Not on file  Social History Narrative   Unemployed. 10 cans of soda per day.    Social Drivers of Corporate investment banker Strain: Low Risk  (01/22/2024)   Overall Financial Resource Strain (CARDIA)    Difficulty of Paying Living Expenses: Not very hard  Food Insecurity: No Food Insecurity (01/22/2024)   Hunger Vital Sign    Worried About Running Out of Food in the Last Year: Never true    Ran Out of Food in the Last Year: Never true  Transportation Needs: No Transportation Needs (01/22/2024)   PRAPARE - Administrator, Civil Service (Medical): No    Lack of Transportation (Non-Medical): No  Physical Activity: Inactive (01/22/2024)   Exercise Vital Sign    Days of Exercise per Week: 1 day    Minutes of Exercise per Session: 0 min  Stress: Stress Concern Present (01/22/2024)   [employer] of Occupational Health - Occupational Stress Questionnaire    Feeling of Stress : Rather much  Social Connections: Moderately Isolated (01/22/2024)   Social Connection and Isolation Panel [NHANES]    Frequency of Communication with Friends and Family: Twice a week    Frequency of Social Gatherings with Friends and Family: Once  a week    Attends Religious Services: Never    Database administrator or Organizations: No    Attends Engineer, structural: Not on file    Marital Status: Married  Catering manager Violence: Not At Risk (04/04/2023)   Received from [employer], [facility]   HITS    Over the last 12 months how often did your partner physically hurt you?: Never    Over the last 12 months how often did your partner insult you or talk down to you?: Never    Over the last 12 months how often did your partner threaten you with physical harm?: Never  Over the last 12 months how often did your partner scream or curse at you?: Rarely    Outpatient Encounter Medications as of 04/05/2024  Medication Sig   albuterol  (VENTOLIN  HFA) 108 (90 Base) MCG/ACT inhaler INHALE 2 PUFFS BY MOUTH EVERY 4 HOURS AS NEEDED FOR WHEEZING OR SHORTNESS OF BREATH   escitalopram  (LEXAPRO ) 10 MG tablet Take 1 tablet (10 mg total) by mouth daily.   fluticasone  (FLONASE ) 50 MCG/ACT nasal spray Place 2 sprays into both nostrils daily. (Patient taking differently: Place 2 sprays into both nostrils daily as needed for allergies.)   levothyroxine  (SYNTHROID ) 100 MCG tablet Take 100 mcg by mouth daily.   omega-3 acid ethyl esters (LOVAZA) 1 g capsule Take by mouth.   omeprazole  (PRILOSEC) 20 MG capsule Take 1 capsule (20 mg total) by mouth 2 (two) times daily. (Patient taking differently: Take 20 mg by mouth 2 (two) times daily as needed (Heartburn).)   prednisoLONE acetate (PRED FORTE) 1 % ophthalmic suspension Place 1 drop into the right eye 4 (four) times daily.   rosuvastatin  (CRESTOR ) 10 MG tablet TAKE 1 TABLET BY MOUTH ONCE DAILY FOR CHOLESTEROL   traZODone  (DESYREL ) 50 MG tablet Take 0.5-1 tablets (25-50 mg total) by mouth at bedtime as needed for sleep.   Vitamin D , Ergocalciferol , (DRISDOL ) 1.25 MG (50000 UNIT) CAPS capsule Take 1 capsule (50,000 Units total) by mouth every 7 (seven) days.   No facility-administered  encounter medications on file as of 04/05/2024.    Allergies  Allergen Reactions   Misc. Sulfonamide Containing Compounds Nausea And Vomiting and Other (See Comments)   Codeine Other (See Comments)    Migraines   Sulfonamide Derivatives Nausea And Vomiting   Betadine [Povidone Iodine] Other (See Comments)    Blisters    Paxil  [Paroxetine  Hcl] Other (See Comments)    Extreme sweating   Vioxx [Rofecoxib] Rash    Review of Systems As per HPI  Objective:  BP 129/75   Pulse (!) 54   Temp 98.2 F (36.8 C)   Ht 5\' 5"  (1.651 m)   Wt 202 lb (91.6 kg)   SpO2 98%   BMI 33.61 kg/m    Wt Readings from Last 3 Encounters:  04/05/24 202 lb (91.6 kg)  02/09/24 205 lb (93 kg)  01/29/24 204 lb 12.8 oz (92.9 kg)    Physical Exam Constitutional:      General: She is awake. She is not in acute distress.    Appearance: Normal appearance. She is well-developed and well-groomed. She is obese. She is not ill-appearing, toxic-appearing or diaphoretic.  Cardiovascular:     Rate and Rhythm: Regular rhythm. Bradycardia present.     Pulses: Normal pulses.          Radial pulses are 2+ on the right side and 2+ on the left side.       Posterior tibial pulses are 2+ on the right side and 2+ on the left side.     Heart sounds: Normal heart sounds. No murmur heard.    No gallop.  Pulmonary:     Effort: Pulmonary effort is normal. No respiratory distress.     Breath sounds: Normal breath sounds. No stridor. No wheezing, rhonchi or rales.  Musculoskeletal:     Cervical back: Full passive range of motion without pain and neck supple.     Right lower leg: No edema.     Left lower leg: No edema.  Skin:    General: Skin is warm.  Capillary Refill: Capillary refill takes less than 2 seconds.  Neurological:     General: No focal deficit present.     Mental Status: She is alert, oriented to person, place, and time and easily aroused. Mental status is at baseline.     GCS: GCS eye subscore is 4. GCS  verbal subscore is 5. GCS motor subscore is 6.     Motor: No weakness.  Psychiatric:        Attention and Perception: Attention and perception normal.        Mood and Affect: Mood normal. Affect is tearful.        Speech: Speech normal.        Behavior: Behavior normal. Behavior is cooperative.        Thought Content: Thought content normal. Thought content does not include homicidal or suicidal ideation. Thought content does not include homicidal or suicidal plan.        Cognition and Memory: Cognition and memory normal.        Judgment: Judgment normal.     Results for orders placed or performed in visit on 02/09/24  CMP14+EGFR   Collection Time: 02/09/24 10:57 AM  Result Value Ref Range   Glucose 92 70 - 99 mg/dL   BUN 8 8 - 27 mg/dL   Creatinine, Ser 1.61 0.57 - 1.00 mg/dL   eGFR 69 >09 UE/AVW/0.98   BUN/Creatinine Ratio 9 (L) 12 - 28   Sodium 143 134 - 144 mmol/L   Potassium 4.1 3.5 - 5.2 mmol/L   Chloride 103 96 - 106 mmol/L   CO2 26 20 - 29 mmol/L   Calcium  9.9 8.7 - 10.3 mg/dL   Total Protein 6.5 6.0 - 8.5 g/dL   Albumin 4.2 3.9 - 4.9 g/dL   Globulin, Total 2.3 1.5 - 4.5 g/dL   Bilirubin Total 0.3 0.0 - 1.2 mg/dL   Alkaline Phosphatase 106 44 - 121 IU/L   AST 15 0 - 40 IU/L   ALT 19 0 - 32 IU/L  CBC with Differential/Platelet   Collection Time: 02/09/24 10:57 AM  Result Value Ref Range   WBC 9.7 3.4 - 10.8 x10E3/uL   RBC 5.12 3.77 - 5.28 x10E6/uL   Hemoglobin 15.5 11.1 - 15.9 g/dL   Hematocrit 11.9 (H) 14.7 - 46.6 %   MCV 91 79 - 97 fL   MCH 30.3 26.6 - 33.0 pg   MCHC 33.2 31.5 - 35.7 g/dL   RDW 82.9 56.2 - 13.0 %   Platelets 383 150 - 450 x10E3/uL   Neutrophils 68 Not Estab. %   Lymphs 22 Not Estab. %   Monocytes 7 Not Estab. %   Eos 1 Not Estab. %   Basos 1 Not Estab. %   Neutrophils Absolute 6.7 1.4 - 7.0 x10E3/uL   Lymphocytes Absolute 2.1 0.7 - 3.1 x10E3/uL   Monocytes Absolute 0.7 0.1 - 0.9 x10E3/uL   EOS (ABSOLUTE) 0.1 0.0 - 0.4 x10E3/uL    Basophils Absolute 0.1 0.0 - 0.2 x10E3/uL   Immature Granulocytes 1 Not Estab. %   Immature Grans (Abs) 0.1 0.0 - 0.1 x10E3/uL  Magnesium   Collection Time: 02/09/24 10:57 AM  Result Value Ref Range   Magnesium 2.1 1.6 - 2.3 mg/dL  VITAMIN D  25 Hydroxy (Vit-D Deficiency, Fractures)   Collection Time: 02/09/24 10:57 AM  Result Value Ref Range   Vit D, 25-Hydroxy 59.5 30.0 - 100.0 ng/mL       04/05/2024    9:55 AM 02/09/2024  10:31 AM 11/03/2023    9:24 AM 10/06/2021    6:59 AM 10/26/2020    9:17 AM  Depression screen PHQ 2/9  Decreased Interest 0 0 0 2 0  Down, Depressed, Hopeless 2 0 0 2 0  PHQ - 2 Score 2 0 0 4 0  Altered sleeping 2 2 2 2    Tired, decreased energy 2 1 0 3   Change in appetite 1 0 0 1   Feeling bad or failure about yourself  1 0 0 1   Trouble concentrating 0 0 0 2   Moving slowly or fidgety/restless 0 0 0 0   Suicidal thoughts 0 0 0 0   PHQ-9 Score 8 3 2 13    Difficult doing work/chores Somewhat difficult Not difficult at all  Somewhat difficult        04/05/2024    9:55 AM 02/09/2024   10:32 AM 11/03/2023    9:25 AM 06/15/2018    9:18 AM  GAD 7 : Generalized Anxiety Score  Nervous, Anxious, on Edge 3 2 1 1   Control/stop worrying 1 0 0 2  Worry too much - different things 3 0 1 2  Trouble relaxing 1 0 0 3  Restless 0 0 0 0  Easily annoyed or irritable 3 2 1 2   Afraid - awful might happen 0 0 0 0  Total GAD 7 Score 11 4 3 10   Anxiety Difficulty Somewhat difficult Not difficult at all Not difficult at all Somewhat difficult   Pertinent labs & imaging results that were available during my care of the patient were reviewed by me and considered in my medical decision making.  Assessment & Plan:  Fabianna was seen today for medical management of chronic issues.  Diagnoses and all orders for this visit:  1. Centrilobular emphysema (HCC) (Primary) Will start medication as below. Patient to complete follow up imaging. Discussed rinsing mouth out after use.   - budeson-glycopyrrolate-formoterol (BREZTRI AEROSPHERE) 160-9-4.8 MCG/ACT AERO inhaler; Inhale 2 puffs into the lungs 2 (two) times daily.  Dispense: 10.7 g; Refill: 11  2. Mild intermittent asthma without complication Stable. Continue current regimen.   3. Morbid obesity (HCC) Discussed with patient to continue healthy lifestyle choices, including diet (rich in fruits, vegetables, and lean proteins, and low in salt and simple carbohydrates) and exercise (at least 30 minutes of moderate physical activity daily). Limit beverages high is sugar. Recommended at least 80-100 oz of water daily.  Understand that patient is having difficulty balancing care for other family members and health.   4. Hypothyroidism, unspecified type Stable. Labs as below.  - TSH  5. Mixed hyperlipidemia Labs as below. Will communicate results to patient once available. Will await results to determine next steps.  - CMP14+EGFR - CBC with Differential/Platelet - Lipid panel  6. Prediabetes Labs as below. Will communicate results to patient once available. Will await results to determine next steps.  - Bayer DCA Hb A1c Waived  7. Vitamin D  deficiency Discussed with patient that last lab was within range. Recommend patient switch to daily 1000-2000 international units OTC.   8. Migraine without aura and without status migrainosus, not intractable Stable. Continue current regimen.   9. Gastroesophageal reflux disease without esophagitis Stable. Continue current regimen.   10. Anxiety and depression Not at goal at this time. Discussed with patient that she can increase lexapro  to 20 mg. Denies SI. Declines referral to counseling at this time.   11. TOBACCO ABUSE Praised patient for decreasing  intake. Would like for patient to complete follow up screening.   Continue all other maintenance medications.  Follow up plan: Return in about 3 months (around 07/05/2024) for Chronic Condition Follow up.  Continue  healthy lifestyle choices, including diet (rich in fruits, vegetables, and lean proteins, and low in salt and simple carbohydrates) and exercise (at least 30 minutes of moderate physical activity daily).  Written and verbal instructions provided   The above assessment and management plan was discussed with the patient. The patient verbalized understanding of and has agreed to the management plan. Patient is aware to call the clinic if they develop any new symptoms or if symptoms persist or worsen. Patient is aware when to return to the clinic for a follow-up visit. Patient educated on when it is appropriate to go to the emergency department.   Jacqualyn Mates, DNP-FNP [facility] [address] [place], [place] [address] [phone]

## 2024-04-06 LAB — CMP14+EGFR
ALT: 22 IU/L (ref 0–32)
AST: 17 IU/L (ref 0–40)
Albumin: 4.3 g/dL (ref 3.9–4.9)
Alkaline Phosphatase: 95 IU/L (ref 44–121)
BUN/Creatinine Ratio: 9 — ABNORMAL LOW (ref 12–28)
BUN: 8 mg/dL (ref 8–27)
Bilirubin Total: 0.4 mg/dL (ref 0.0–1.2)
CO2: 24 mmol/L (ref 20–29)
Calcium: 9.5 mg/dL (ref 8.7–10.3)
Chloride: 104 mmol/L (ref 96–106)
Creatinine, Ser: 0.91 mg/dL (ref 0.57–1.00)
Globulin, Total: 1.9 g/dL (ref 1.5–4.5)
Glucose: 92 mg/dL (ref 70–99)
Potassium: 3.9 mmol/L (ref 3.5–5.2)
Sodium: 142 mmol/L (ref 134–144)
Total Protein: 6.2 g/dL (ref 6.0–8.5)
eGFR: 72 mL/min/{1.73_m2} (ref >59)

## 2024-04-06 LAB — CBC WITH DIFFERENTIAL/PLATELET
Basophils Absolute: 0 10*3/uL (ref 0.0–0.2)
Basos: 1 %
EOS (ABSOLUTE): 0.1 10*3/uL (ref 0.0–0.4)
Eos: 1 %
Hematocrit: 44.6 % (ref 34.0–46.6)
Hemoglobin: 14.7 g/dL (ref 11.1–15.9)
Immature Grans (Abs): 0 10*3/uL (ref 0.0–0.1)
Immature Granulocytes: 0 %
Lymphocytes Absolute: 1.9 10*3/uL (ref 0.7–3.1)
Lymphs: 27 %
MCH: 29.5 pg (ref 26.6–33.0)
MCHC: 33 g/dL (ref 31.5–35.7)
MCV: 90 fL (ref 79–97)
Monocytes Absolute: 0.6 10*3/uL (ref 0.1–0.9)
Monocytes: 9 %
Neutrophils Absolute: 4.3 10*3/uL (ref 1.4–7.0)
Neutrophils: 62 %
Platelets: 343 10*3/uL (ref 150–450)
RBC: 4.98 x10E6/uL (ref 3.77–5.28)
RDW: 12.8 % (ref 11.7–15.4)
WBC: 7 10*3/uL (ref 3.4–10.8)

## 2024-04-06 LAB — LIPID PANEL
Chol/HDL Ratio: 2.7 ratio (ref 0.0–4.4)
Cholesterol, Total: 126 mg/dL (ref 100–199)
HDL: 47 mg/dL (ref >39)
LDL Chol Calc (NIH): 57 mg/dL (ref 0–99)
Triglycerides: 127 mg/dL (ref 0–149)
VLDL Cholesterol Cal: 22 mg/dL (ref 5–40)

## 2024-04-06 LAB — TSH: TSH: 0.908 u[IU]/mL (ref 0.450–4.500)

## 2024-04-08 ENCOUNTER — Ambulatory Visit: Payer: 59

## 2024-04-08 DIAGNOSIS — I442 Atrioventricular block, complete: Secondary | ICD-10-CM | POA: Diagnosis not present

## 2024-04-08 LAB — CUP PACEART REMOTE DEVICE CHECK
Battery Remaining Longevity: 119 mo
Battery Remaining Percentage: 91 %
Battery Voltage: 3.02 V
Brady Statistic AP VP Percent: 1 %
Brady Statistic AP VS Percent: 4.2 %
Brady Statistic AS VP Percent: 1 %
Brady Statistic AS VS Percent: 96 %
Brady Statistic RA Percent Paced: 3.7 %
Brady Statistic RV Percent Paced: 1 %
Date Time Interrogation Session: 20250428023016
Implantable Lead Connection Status: 753985
Implantable Lead Connection Status: 753985
Implantable Lead Implant Date: 20111130
Implantable Lead Implant Date: 20111130
Implantable Lead Location: 753859
Implantable Lead Location: 753860
Implantable Pulse Generator Implant Date: 20231027
Lead Channel Impedance Value: 340 Ohm
Lead Channel Impedance Value: 380 Ohm
Lead Channel Pacing Threshold Amplitude: 0.75 V
Lead Channel Pacing Threshold Amplitude: 1.75 V
Lead Channel Pacing Threshold Pulse Width: 0.4 ms
Lead Channel Pacing Threshold Pulse Width: 0.4 ms
Lead Channel Sensing Intrinsic Amplitude: 2.1 mV
Lead Channel Sensing Intrinsic Amplitude: 4.2 mV
Lead Channel Setting Pacing Amplitude: 2 V
Lead Channel Setting Pacing Amplitude: 2 V
Lead Channel Setting Pacing Pulse Width: 0.4 ms
Lead Channel Setting Sensing Sensitivity: 2 mV
Pulse Gen Model: 2272
Pulse Gen Serial Number: 8125938

## 2024-04-09 ENCOUNTER — Encounter: Payer: Self-pay | Admitting: Internal Medicine

## 2024-04-09 ENCOUNTER — Encounter: Payer: Self-pay | Admitting: Family Medicine

## 2024-04-09 NOTE — Progress Notes (Signed)
 A1C well controlled. Bun/Creatinine stable.  Cholesterol improved! Continue fish oil and crestor .

## 2024-04-11 ENCOUNTER — Encounter: Payer: Self-pay | Admitting: Family Medicine

## 2024-04-12 MED ORDER — BENZONATATE 100 MG PO CAPS: 100.0000 mg | ORAL_CAPSULE | Freq: Three times a day (TID) | ORAL | 0 refills | Status: DC | PRN

## 2024-04-30 ENCOUNTER — Other Ambulatory Visit: Payer: Self-pay | Admitting: Family Medicine

## 2024-04-30 DIAGNOSIS — F419 Anxiety disorder, unspecified: Secondary | ICD-10-CM

## 2024-04-30 DIAGNOSIS — F5104 Psychophysiologic insomnia: Secondary | ICD-10-CM

## 2024-05-23 NOTE — Progress Notes (Signed)
 Remote pacemaker transmission.

## 2024-05-24 ENCOUNTER — Ambulatory Visit: Admitting: Family Medicine

## 2024-06-17 ENCOUNTER — Other Ambulatory Visit: Payer: Self-pay | Admitting: Nurse Practitioner

## 2024-06-17 MED ORDER — ROSUVASTATIN CALCIUM 10 MG PO TABS: ORAL_TABLET | ORAL | 0 refills | Status: DC

## 2024-06-17 NOTE — Telephone Encounter (Signed)
 Copied from CRM [id number]. Topic: Clinical - Medication Refill >> Jun 17, 2024 11:46 AM Wess RAMAN wrote: Medication: rosuvastatin  (CRESTOR ) 10 MG tablet   Has the patient contacted their pharmacy? No (Agent: If no, request that the patient contact the pharmacy for the refill. If patient does not wish to contact the pharmacy document the reason why and proceed with request.) (Agent: If yes, when and what did the pharmacy advise?)  This is the patient's preferred pharmacy:  [id number] - [place], [place] - [address] [place] [address] Phone: [phone] Fax: [phone]  Is this the correct pharmacy for this prescription? Yes If no, delete pharmacy and type the correct one.   Has the prescription been filled recently? Yes  Is the patient out of the medication? Yes  Has the patient been seen for an appointment in the last year OR does the patient have an upcoming appointment? Yes  Can we respond through MyChart? Yes  Agent: Please be advised that Rx refills may take up to 3 business days. We ask that you follow-up with your pharmacy.

## 2024-06-25 ENCOUNTER — Other Ambulatory Visit: Payer: Self-pay | Admitting: *Deleted

## 2024-06-25 DIAGNOSIS — F419 Anxiety disorder, unspecified: Secondary | ICD-10-CM

## 2024-06-25 MED ORDER — ESCITALOPRAM OXALATE 10 MG PO TABS: 10.0000 mg | ORAL_TABLET | Freq: Every day | ORAL | 0 refills | Status: DC

## 2024-07-08 ENCOUNTER — Ambulatory Visit (INDEPENDENT_AMBULATORY_CARE_PROVIDER_SITE_OTHER): Payer: 59

## 2024-07-08 DIAGNOSIS — I442 Atrioventricular block, complete: Secondary | ICD-10-CM

## 2024-07-09 LAB — CUP PACEART REMOTE DEVICE CHECK
Battery Remaining Longevity: 118 mo
Battery Remaining Percentage: 89 %
Battery Voltage: 3.01 V
Brady Statistic AP VP Percent: 1 %
Brady Statistic AP VS Percent: 4.9 %
Brady Statistic AS VP Percent: 1 %
Brady Statistic AS VS Percent: 95 %
Brady Statistic RA Percent Paced: 4.3 %
Brady Statistic RV Percent Paced: 1 %
Date Time Interrogation Session: 20250728020014
Implantable Lead Connection Status: 753985
Implantable Lead Connection Status: 753985
Implantable Lead Implant Date: 20111130
Implantable Lead Implant Date: 20111130
Implantable Lead Location: 753859
Implantable Lead Location: 753860
Implantable Pulse Generator Implant Date: 20231027
Lead Channel Impedance Value: 360 Ohm
Lead Channel Impedance Value: 410 Ohm
Lead Channel Pacing Threshold Amplitude: 0.75 V
Lead Channel Pacing Threshold Amplitude: 1.5 V
Lead Channel Pacing Threshold Pulse Width: 0.4 ms
Lead Channel Pacing Threshold Pulse Width: 0.4 ms
Lead Channel Sensing Intrinsic Amplitude: 2.5 mV
Lead Channel Sensing Intrinsic Amplitude: 4.3 mV
Lead Channel Setting Pacing Amplitude: 1.75 V
Lead Channel Setting Pacing Amplitude: 2 V
Lead Channel Setting Pacing Pulse Width: 0.4 ms
Lead Channel Setting Sensing Sensitivity: 2 mV
Pulse Gen Model: 2272
Pulse Gen Serial Number: 8125938

## 2024-07-10 ENCOUNTER — Ambulatory Visit: Admitting: Nurse Practitioner

## 2024-07-10 ENCOUNTER — Encounter: Payer: Self-pay | Admitting: Nurse Practitioner

## 2024-07-10 VITALS — BP 138/82 | HR 70 | Temp 98.0°F | Ht 65.0 in | Wt 206.0 lb

## 2024-07-10 DIAGNOSIS — E039 Hypothyroidism, unspecified: Secondary | ICD-10-CM | POA: Diagnosis not present

## 2024-07-10 DIAGNOSIS — F419 Anxiety disorder, unspecified: Secondary | ICD-10-CM

## 2024-07-10 DIAGNOSIS — E782 Mixed hyperlipidemia: Secondary | ICD-10-CM

## 2024-07-10 DIAGNOSIS — F32A Depression, unspecified: Secondary | ICD-10-CM

## 2024-07-10 DIAGNOSIS — E559 Vitamin D deficiency, unspecified: Secondary | ICD-10-CM

## 2024-07-10 DIAGNOSIS — F5104 Psychophysiologic insomnia: Secondary | ICD-10-CM

## 2024-07-10 DIAGNOSIS — F331 Major depressive disorder, recurrent, moderate: Secondary | ICD-10-CM

## 2024-07-10 MED ORDER — TRAZODONE HCL 100 MG PO TABS: 100.0000 mg | ORAL_TABLET | Freq: Every day | ORAL | 0 refills | Status: DC

## 2024-07-10 NOTE — Progress Notes (Signed)
 Established Patient Office Visit  Subjective  Patient ID: [name], female    DOB: 12-06-1961  Age: 63 y.o. MRN: [id number]  Chief Complaint  Patient presents with   Medical Management of Chronic Issues    insomnia    HPI [name]. Desir is a 63 year old female who presents on 07/10/2024 for evaluation of insomnia. This is the writer's first encounter with the patient, as her previous PCP has left the practice. The patient reports that she is not interested in re-establishing ongoing care at this time. Insomnia: The patient reports ongoing difficulties with both falling and staying asleep despite current use of trazodone  (dose not specified). She states she typically sleeps only about 3 hours at a time and describes frequent nighttime awakenings every 2-3 hours, either to use the bathroom or because she is unable to fall back asleep. She describes persistent racing thoughts that prevent her from shutting her mind down. She has attempted non-pharmacologic interventions such as wearing a sleep mask and using ambient noise, but these have not provided relief.  Obesity: The patient is also seeking treatment for weight loss and inquires specifically about Wegovy. Her BMI is calculated at 34.28. She reports a history of participation in a wellness center program with minimal results. She states, "My husband is on Wegovy; I want to try it." The patient was advised to begin with lifestyle modifications including a structured diet and exercise regimen. She was encouraged to keep a food and activity diary to discuss at a future visit with her next primary care provider.    07/10/2024    8:57 AM 04/05/2024    9:55 AM 02/09/2024   10:31 AM  PHQ9 SCORE ONLY  PHQ-9 Total Score 9 8 3        07/10/2024    8:57 AM 04/05/2024    9:55 AM 02/09/2024   10:32 AM 11/03/2023    9:25 AM  GAD 7 : Generalized Anxiety Score  Nervous, Anxious, on Edge 3 3 2 1   Control/stop worrying 1 1 0 0  Worry too much -  different things 2 3 0 1  Trouble relaxing 1 1 0 0  Restless 0 0 0 0  Easily annoyed or irritable 2 3 2 1   Afraid - awful might happen 1 0 0 0  Total GAD 7 Score 10 11 4 3   Anxiety Difficulty Somewhat difficult Somewhat difficult Not difficult at all Not difficult at all      Patient Active Problem List   Diagnosis Date Noted   Moderate episode of recurrent major depressive disorder (HCC) 07/10/2024   Psychophysiological insomnia 07/10/2024   Astigmatism of both eyes with presbyopia 11/24/2023   Myopia of both eyes with astigmatism and presbyopia 06/01/2023   Nuclear sclerotic cataract of both eyes 06/01/2023   Atrial fibrillation (HCC) 01/10/2023   Prediabetes 08/29/2022   Migraine headache 08/29/2022   IFG (impaired fasting glucose) 08/29/2022   IBS (irritable bowel syndrome) 08/29/2022   HLD (hyperlipidemia) 08/29/2022   Heart defect 08/29/2022   Emphysema, unspecified (HCC) 08/29/2022   Asthma 08/29/2022   Arthritis 08/29/2022   Allergy 08/29/2022   Meralgia paresthetica 02/10/2021   Vitamin D  deficiency 10/26/2020   Morbid obesity (HCC) 07/01/2017   Anxiety and depression 04/13/2016   Hypothyroidism 07/04/2014   GERD (gastroesophageal reflux disease) 10/26/2011   PPM-St.Jude 02/14/2011   AV BLOCK, COMPLETE 10/28/2010   Right bundle branch block with left anterior fascicular block 10/28/2010   TOBACCO ABUSE 09/14/2010   OBSTRUCTIVE SLEEP  APNEA 09/14/2010   Past Medical History:  Diagnosis Date   Allergy    Anxiety    Arthritis    Asthma    Cataract    Depression    bipolar disorder   Emphysema, unspecified (HCC)    Esophageal stricture    GERD (gastroesophageal reflux disease)    Heart defect    HLD (hyperlipidemia)    Hypothyroidism    IBS (irritable bowel syndrome)    IFG (impaired fasting glucose)    Migraine headache    Obesity    OSA (obstructive sleep apnea)    Pacemaker    Prediabetes    Reflux    Sleep apnea    SOB (shortness of breath)     Symptomatic bradycardia    Past Surgical History:  Procedure Laterality Date   ABDOMINAL HERNIA REPAIR     BREAST CYST EXCISION     left   CATARACT EXTRACTION Bilateral    CESAREAN SECTION  1983. 1987   Cesarean sections     x2   CHOLECYSTECTOMY     COLONOSCOPY     dental implants     foor surgery Right    screws    FOOT SURGERY     screws in left foot   HERNIA REPAIR  2005   PACEMAKER INSERTION     PPM GENERATOR CHANGEOUT N/A 10/07/2022   Procedure: PPM GENERATOR CHANGEOUT;  Surgeon: Waddell Danelle ORN, MD;  Location: [facility];  Service: Cardiovascular;  Laterality: N/A;   TUBAL LIGATION     VAGINAL HYSTERECTOMY     Social History   Tobacco Use   Smoking status: Former    Current packs/day: 0.00    Types: Cigarettes    Quit date: 07/08/2022    Years since quitting: 2.0   Smokeless tobacco: Never  Vaping Use   Vaping status: Former  Substance Use Topics   Alcohol use: No    Alcohol/week: 0.0 standard drinks of alcohol    Comment: Occ.    Drug use: No   Social History   Socioeconomic History   Marital status: Married    Spouse name: Lynwood   Number of children: 2   Years of education: Not on file   Highest education level: Some college, no degree  Occupational History   Occupation: retired    Associate Professor: [employer]   Occupation: Nana, Housewife  Tobacco Use   Smoking status: Former    Current packs/day: 0.00    Types: Cigarettes    Quit date: 07/08/2022    Years since quitting: 2.0   Smokeless tobacco: Never  Vaping Use   Vaping status: Former  Substance and Sexual Activity   Alcohol use: No    Alcohol/week: 0.0 standard drinks of alcohol    Comment: Occ.    Drug use: No   Sexual activity: Not on file  Other Topics Concern   Not on file  Social History Narrative   Unemployed. 10 cans of soda per day.    Social Drivers of Corporate investment banker Strain: Low Risk  (07/06/2024)   Overall Financial Resource Strain (CARDIA)    Difficulty of  Paying Living Expenses: Not very hard  Food Insecurity: No Food Insecurity (07/06/2024)   Hunger Vital Sign    Worried About Running Out of Food in the Last Year: Never true    Ran Out of Food in the Last Year: Never true  Transportation Needs: No Transportation Needs (07/06/2024)   PRAPARE -  Administrator, Civil Service (Medical): No    Lack of Transportation (Non-Medical): No  Physical Activity: Inactive (07/06/2024)   Exercise Vital Sign    Days of Exercise per Week: 0 days    Minutes of Exercise per Session: Not on file  Stress: Stress Concern Present (07/06/2024)   [employer] of Occupational Health - Occupational Stress Questionnaire    Feeling of Stress: Very much  Social Connections: Moderately Isolated (07/06/2024)   Social Connection and Isolation Panel    Frequency of Communication with Friends and Family: Twice a week    Frequency of Social Gatherings with Friends and Family: Once a week    Attends Religious Services: Never    Database administrator or Organizations: No    Attends Engineer, structural: Not on file    Marital Status: Married  Catering manager Violence: Not At Risk (07/10/2024)   Humiliation, Afraid, Rape, and Kick questionnaire    Fear of Current or Ex-Partner: No    Emotionally Abused: No    Physically Abused: No    Sexually Abused: No   Family Status  Relation Name Status   Mother Elveria (Not Specified)   Father Dempsey Deceased   Brother Randy Alive   Neg Hx  (Not Specified)  No partnership data on file   Family History  Problem Relation Age of Onset   Arthritis Mother    Heart disease Mother        coronary; female < 13   Asthma Mother    High Cholesterol Mother    Thyroid disease Mother    Depression Mother    Bipolar disorder Mother    Throat cancer Father        squamous cell carcinoma   Tongue cancer Father    Esophageal cancer Father    Depression Father    Cancer Father    Cancer Brother    Depression  Brother    Colon cancer Neg Hx    Rectal cancer Neg Hx    Stomach cancer Neg Hx    Allergies  Allergen Reactions   Misc. Sulfonamide Containing Compounds Nausea And Vomiting and Other (See Comments)   Codeine Other (See Comments)    Migraines   Sulfonamide Derivatives Nausea And Vomiting   Betadine [Povidone Iodine] Other (See Comments)    Blisters    Paxil  [Paroxetine  Hcl] Other (See Comments)    Extreme sweating   Vioxx [Rofecoxib] Rash      Review of Systems  Constitutional:  Negative for chills and fever.  HENT:  Negative for congestion and sore throat.   Respiratory:  Negative for cough and wheezing.   Cardiovascular:  Negative for chest pain and leg swelling.  Gastrointestinal:  Negative for blood in stool, constipation, nausea and vomiting.  Musculoskeletal:  Negative for falls.  Skin:  Negative for itching and rash.  Neurological:  Negative for dizziness and headaches.  Psychiatric/Behavioral:  The patient has insomnia.    Negative unless indicated in HPI   Objective:     BP 138/82   Pulse 70   Temp 98 F (36.7 C)   Ht 5' 5 (1.651 m)   Wt 206 lb (93.4 kg)   SpO2 97%   BMI 34.28 kg/m  BP Readings from Last 3 Encounters:  07/10/24 138/82  04/05/24 129/75  02/09/24 (!) 144/84   Wt Readings from Last 3 Encounters:  07/10/24 206 lb (93.4 kg)  04/05/24 202 lb (91.6 kg)  02/09/24 205 lb (  93 kg)      Physical Exam Vitals and nursing note reviewed.  Constitutional:      General: She is not in acute distress. HENT:     Head: Normocephalic and atraumatic.     Right Ear: Tympanic membrane, ear canal and external ear normal. There is no impacted cerumen.     Left Ear: Tympanic membrane, ear canal and external ear normal. There is no impacted cerumen.     Nose: Nose normal.     Mouth/Throat:     Mouth: Mucous membranes are moist.  Eyes:     General: No scleral icterus.    Extraocular Movements: Extraocular movements intact.     Conjunctiva/sclera:  Conjunctivae normal.     Pupils: Pupils are equal, round, and reactive to light.  Cardiovascular:     Heart sounds: Normal heart sounds.  Pulmonary:     Effort: Pulmonary effort is normal.     Breath sounds: Normal breath sounds.  Musculoskeletal:        General: Normal range of motion.     Right lower leg: No edema.     Left lower leg: No edema.  Skin:    General: Skin is dry.     Findings: No rash.  Neurological:     Mental Status: She is alert and oriented to person, place, and time.  Psychiatric:        Mood and Affect: Mood normal.        Behavior: Behavior normal.        Thought Content: Thought content normal.        Judgment: Judgment normal.      No results found for any visits on 07/10/24.  Last CBC Lab Results  Component Value Date   WBC 7.0 04/05/2024   HGB 14.7 04/05/2024   HCT 44.6 04/05/2024   MCV 90 04/05/2024   MCH 29.5 04/05/2024   RDW 12.8 04/05/2024   PLT 343 04/05/2024   Last metabolic panel Lab Results  Component Value Date   GLUCOSE 92 04/05/2024   NA 142 04/05/2024   K 3.9 04/05/2024   CL 104 04/05/2024   CO2 24 04/05/2024   BUN 8 04/05/2024   CREATININE 0.91 04/05/2024   EGFR 72 04/05/2024   CALCIUM  9.5 04/05/2024   PROT 6.2 04/05/2024   ALBUMIN 4.3 04/05/2024   LABGLOB 1.9 04/05/2024   AGRATIO 2.1 10/08/2020   BILITOT 0.4 04/05/2024   ALKPHOS 95 04/05/2024   AST 17 04/05/2024   ALT 22 04/05/2024   ANIONGAP 13 10/07/2022   Last lipids Lab Results  Component Value Date   CHOL 126 04/05/2024   HDL 47 04/05/2024   LDLCALC 57 04/05/2024   TRIG 127 04/05/2024   CHOLHDL 2.7 04/05/2024   Last hemoglobin A1c Lab Results  Component Value Date   HGBA1C 5.5 04/05/2024   Last thyroid functions Lab Results  Component Value Date   TSH 0.908 04/05/2024        Assessment & Plan:  Anxiety and depression  Mixed hyperlipidemia  Hypothyroidism, unspecified type  Vitamin D  deficiency  Moderate episode of recurrent major  depressive disorder (HCC)  Psychophysiological insomnia -     traZODone  HCl; Take 1 tablet (100 mg total) by mouth at bedtime.  Dispense: 90 tablet; Refill: 0  Dezarae is a 63 yrs old Caucasian female seen today for chronic diseases management, no acute Insomnia: Increase Trazodone  to 100 mg at bedtime for improved sleep initiation and maintenance. Recommend addition of over-the-counter melatonin,  3-5 mg taken 30-60 minutes before bedtime. Encourage use of lavender essential oil on the pillow or in a diffuser at bedtime to promote relaxation. Reinforce good sleep hygiene practices: maintain a consistent sleep schedule, avoid screen time before bed, limit caffeine intake, and create a calming nighttime routine.  Obesity: Discussed benefits of lifestyle modification as first-line treatment: Begin a balanced, calorie-restricted diet focusing on whole foods, lean proteins, and vegetables. Initiate a gradual exercise program, aiming for at least 150 minutes of moderate-intensity activity per week. Keep a daily food and activity diary to increase self-awareness and facilitate discussion at future visits. Patient expressed interest in Wegovy (semaglutide). She was advised to follow up with her PCP or weight management specialist for evaluation and potential initiation, pending insurance approval and full clinical assessment.     The above assessment and management plan was discussed with the patient. The patient verbalized understanding of and has agreed to the management plan. Patient is aware to call the clinic if they develop any new symptoms or if symptoms persist or worsen. Patient is aware when to return to the clinic for a follow-up visit. Patient educated on when it is appropriate to go to the emergency department.  Return if symptoms worsen or fail to improve.    Valetta Mulroy [place] Thompson, DNP [facility] [address] [place], [place] [address] [phone]    Note: This document was prepared by [employer] voice dictation technology and any errors that results from this process are unintentional.

## 2024-07-12 ENCOUNTER — Ambulatory Visit: Payer: Self-pay | Admitting: Internal Medicine

## 2024-07-16 DIAGNOSIS — M1712 Unilateral primary osteoarthritis, left knee: Secondary | ICD-10-CM | POA: Insufficient documentation

## 2024-07-23 ENCOUNTER — Other Ambulatory Visit: Payer: Self-pay | Admitting: *Deleted

## 2024-07-23 DIAGNOSIS — F5104 Psychophysiologic insomnia: Secondary | ICD-10-CM

## 2024-09-05 ENCOUNTER — Encounter: Admitting: Nurse Practitioner

## 2024-09-09 ENCOUNTER — Other Ambulatory Visit: Payer: Self-pay | Admitting: Nurse Practitioner

## 2024-09-12 NOTE — Progress Notes (Signed)
 Remote PPM Transmission

## 2024-09-13 ENCOUNTER — Encounter: Payer: Self-pay | Admitting: Physical Therapy

## 2024-09-13 ENCOUNTER — Ambulatory Visit: Attending: Orthopedic Surgery | Admitting: Physical Therapy

## 2024-09-13 ENCOUNTER — Other Ambulatory Visit: Payer: Self-pay

## 2024-09-13 DIAGNOSIS — M25662 Stiffness of left knee, not elsewhere classified: Secondary | ICD-10-CM | POA: Insufficient documentation

## 2024-09-13 DIAGNOSIS — R600 Localized edema: Secondary | ICD-10-CM | POA: Diagnosis present

## 2024-09-13 DIAGNOSIS — M25562 Pain in left knee: Secondary | ICD-10-CM | POA: Insufficient documentation

## 2024-09-13 DIAGNOSIS — G8929 Other chronic pain: Secondary | ICD-10-CM | POA: Insufficient documentation

## 2024-09-13 NOTE — Therapy (Signed)
 OUTPATIENT PHYSICAL THERAPY LOWER EXTREMITY EVALUATION   Patient Name: [name] MRN: [id number] DOB:03-22-1961, 63 y.o., female Today's Date: 09/13/2024  END OF SESSION:  PT End of Session - 09/13/24 1024     Visit Number 1    Number of Visits 12    Date for Recertification  10/25/24    PT Start Time 0845    PT Stop Time 0930    PT Time Calculation (min) 45 min    Activity Tolerance Patient tolerated treatment well    Behavior During Therapy WFL for tasks assessed/performed          Past Medical History:  Diagnosis Date   Allergy    Anxiety    Arthritis    Asthma    Cataract    Depression    bipolar disorder   Emphysema, unspecified (HCC)    Esophageal stricture    GERD (gastroesophageal reflux disease)    Heart defect    HLD (hyperlipidemia)    Hypothyroidism    IBS (irritable bowel syndrome)    IFG (impaired fasting glucose)    Migraine headache    Obesity    OSA (obstructive sleep apnea)    Pacemaker    Prediabetes    Reflux    Sleep apnea    SOB (shortness of breath)    Symptomatic bradycardia    Past Surgical History:  Procedure Laterality Date   ABDOMINAL HERNIA REPAIR     BREAST CYST EXCISION     left   CATARACT EXTRACTION Bilateral    CESAREAN SECTION  1983. 1987   Cesarean sections     x2   CHOLECYSTECTOMY     COLONOSCOPY     dental implants     foor surgery Right    screws    FOOT SURGERY     screws in left foot   HERNIA REPAIR  2005   PACEMAKER INSERTION     PPM GENERATOR CHANGEOUT N/A 10/07/2022   Procedure: PPM GENERATOR CHANGEOUT;  Surgeon: Waddell Danelle ORN, MD;  Location: [facility];  Service: Cardiovascular;  Laterality: N/A;   TUBAL LIGATION     VAGINAL HYSTERECTOMY     Patient Active Problem List   Diagnosis Date Noted   Moderate episode of recurrent major depressive disorder (HCC) 07/10/2024   Psychophysiological insomnia 07/10/2024   Astigmatism of both eyes with presbyopia 11/24/2023   Myopia of both eyes  with astigmatism and presbyopia 06/01/2023   Nuclear sclerotic cataract of both eyes 06/01/2023   Atrial fibrillation (HCC) 01/10/2023   Prediabetes 08/29/2022   Migraine headache 08/29/2022   IFG (impaired fasting glucose) 08/29/2022   IBS (irritable bowel syndrome) 08/29/2022   HLD (hyperlipidemia) 08/29/2022   Heart defect 08/29/2022   Emphysema, unspecified (HCC) 08/29/2022   Asthma 08/29/2022   Arthritis 08/29/2022   Allergy 08/29/2022   Meralgia paresthetica 02/10/2021   Vitamin D  deficiency 10/26/2020   Morbid obesity (HCC) 07/01/2017   Anxiety and depression 04/13/2016   Hypothyroidism 07/04/2014   GERD (gastroesophageal reflux disease) 10/26/2011   PPM-St.Jude 02/14/2011   AV BLOCK, COMPLETE 10/28/2010   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 Henderson MD  REFERRING DIAG: Primary OA of left knee s/p total knee replacement  THERAPY DIAG:  Chronic pain of left knee  Stiffness of left knee, not elsewhere classified  Localized edema  Rationale for Evaluation and Treatment:  Rehabilitation  ONSET DATE: 08/13/24 (surgery date).    SUBJECTIVE:   SUBJECTIVE STATEMENT: The patient presents to the clinic s/p left total knee replacement performed on 08/13/24.  She had some home health PT and is compliant to the established HEP.  She is walking safely with a straight cane.  Her pain-level is around a 3-4/10.    PERTINENT HISTORY: Pacemaker.  See above.   PAIN:  Are you having pain? Yes: NPRS scale: 3-4/10. Pain location: Left knee. Pain description: Ache, throb.   Aggravating factors: Lying on leg and putting pressure on it.   Relieving factors: Pain medication.    PRECAUTIONS: ICD/Pacemaker.  No ultrasound, no electrical stimulation.    RED FLAGS: None   WEIGHT BEARING RESTRICTIONS: No  FALLS:  Has patient fallen in last 6 months?  No  LIVING ENVIRONMENT: Lives with: lives with their spouse Lives in: House/apartment Has following equipment at home: Single point cane  PLOF: Independent  PATIENT GOALS: Do more without pain.     OBJECTIVE:   PATIENT SURVEYS:  LEFS:  27/80.  EDEMA:  Circumferential: Left 4 cms > right.  PALPATION: Minimal anterior left knee tenderness.  Incisional site looks to be healing well.   LOWER EXTREMITY ROM:  -7 degrees to 100 degrees.    LOWER EXTREMITY MMT:  Patient easily able to perform an antigravity left SLR and SAQ.   GAIT: Patient was walking safely with a cane with a decrease in step and stride length.                                                                                                                                 TREATMENT DATE: 09/13/24:  Nustep level 1 moving seat forward x 1 to increase knee flexion x 10 minutes f/b  Vasopneumatic on low x 10 minutes.     PATIENT EDUCATION:  Education details:  Person educated:  International aid/development worker:  Education comprehension:   HOME EXERCISE PROGRAM:   ASSESSMENT:  CLINICAL IMPRESSION: The patient presents to [facility] s/p left total knee replacement performed on 08/13/24.  She is pleased with her progress thus far.  She lacks some extension and exhibits active left knee flexion to 100 degrees.  She has moderate left knee edema.  She is easily able to perform an antigravity SLR and SAQ.  She is walking safely with a cane with a decrease in step and stride length.  Her LEFS score is 27/80.  Patient will benefit from skilled physical therapy intervention to address pain and deficits.  OBJECTIVE IMPAIRMENTS: Abnormal gait, decreased activity tolerance, decreased ROM, increased edema, and pain.   ACTIVITY LIMITATIONS: carrying, lifting, bending, and locomotion level  PARTICIPATION LIMITATIONS: meal prep, cleaning, laundry, community activity, and yard work  PERSONAL FACTORS: Time since onset of  injury/illness/exacerbation are also affecting patient's functional outcome.   REHAB POTENTIAL: Excellent  CLINICAL DECISION MAKING: Stable/uncomplicated  EVALUATION COMPLEXITY: Low   GOALS:  SHORT  TERM GOALS: Target date: 09/27/24  Ind with an initial HEP. Goal status: INITIAL   LONG TERM GOALS: Target date: 12/12/24  Ind with an advanced HEP.  Goal status: INITIAL  2.  Active knee flexion to 120 degrees+ so the patient can perform functional tasks and do so with pain not > 2-3/10.  Goal status: INITIAL  3.  Perform a reciprocating stair gait with one railing with pain not > 2-3/10.  Goal status: INITIAL  4.  Full active knee extension in order to normalize gait.  Goal status: INITIAL  5.  Perform ADL's with pain not > 3/10.  Goal status: INITIAL  6.  Improve LEFS score by at least 10 points.    Goal status: INITIAL   PLAN:  PT FREQUENCY: 2x/week  PT DURATION: 6 weeks  PLANNED INTERVENTIONS: 97110-Therapeutic exercises, 97530- Therapeutic activity, [id number]- Neuromuscular re-education, 97535- Self Care, [id number]- Manual therapy, 97016- Vasopneumatic device, Patient/Family education, and Cryotherapy  PLAN FOR NEXT SESSION: Nustep, progress to recumbent bike.  ROM, strengthening.     Shanayah Kaffenberger, [place], PT 09/13/2024, 12:16 PM

## 2024-09-17 ENCOUNTER — Other Ambulatory Visit: Payer: Self-pay | Admitting: Nurse Practitioner

## 2024-09-17 ENCOUNTER — Ambulatory Visit: Admitting: Physical Therapy

## 2024-09-17 DIAGNOSIS — M25662 Stiffness of left knee, not elsewhere classified: Secondary | ICD-10-CM

## 2024-09-17 DIAGNOSIS — M25562 Pain in left knee: Secondary | ICD-10-CM | POA: Diagnosis not present

## 2024-09-17 DIAGNOSIS — G8929 Other chronic pain: Secondary | ICD-10-CM

## 2024-09-17 DIAGNOSIS — R600 Localized edema: Secondary | ICD-10-CM

## 2024-09-17 DIAGNOSIS — F419 Anxiety disorder, unspecified: Secondary | ICD-10-CM

## 2024-09-17 NOTE — Therapy (Signed)
 OUTPATIENT PHYSICAL THERAPY LOWER EXTREMITY TREATMENT  Patient Name: [name] MRN: [id number] DOB:26-Aug-1961, 63 y.o., female Today's Date: 09/17/2024  END OF SESSION:  PT End of Session - 09/17/24 1637     Visit Number 2    Number of Visits 12    Date for Recertification  10/25/24    PT Start Time 0355    PT Stop Time 0441    PT Time Calculation (min) 46 min    Activity Tolerance Patient tolerated treatment well    Behavior During Therapy WFL for tasks assessed/performed          Past Medical History:  Diagnosis Date   Allergy    Anxiety    Arthritis    Asthma    Cataract    Depression    bipolar disorder   Emphysema, unspecified (HCC)    Esophageal stricture    GERD (gastroesophageal reflux disease)    Heart defect    HLD (hyperlipidemia)    Hypothyroidism    IBS (irritable bowel syndrome)    IFG (impaired fasting glucose)    Migraine headache    Obesity    OSA (obstructive sleep apnea)    Pacemaker    Prediabetes    Reflux    Sleep apnea    SOB (shortness of breath)    Symptomatic bradycardia    Past Surgical History:  Procedure Laterality Date   ABDOMINAL HERNIA REPAIR     BREAST CYST EXCISION     left   CATARACT EXTRACTION Bilateral    CESAREAN SECTION  1983. 1987   Cesarean sections     x2   CHOLECYSTECTOMY     COLONOSCOPY     dental implants     foor surgery Right    screws    FOOT SURGERY     screws in left foot   HERNIA REPAIR  2005   PACEMAKER INSERTION     PPM GENERATOR CHANGEOUT N/A 10/07/2022   Procedure: PPM GENERATOR CHANGEOUT;  Surgeon: Waddell Danelle ORN, MD;  Location: [facility];  Service: Cardiovascular;  Laterality: N/A;   TUBAL LIGATION     VAGINAL HYSTERECTOMY     Patient Active Problem List   Diagnosis Date Noted   Moderate episode of recurrent major depressive disorder (HCC) 07/10/2024   Psychophysiological insomnia 07/10/2024   Astigmatism of both eyes with presbyopia 11/24/2023   Myopia of both eyes  with astigmatism and presbyopia 06/01/2023   Nuclear sclerotic cataract of both eyes 06/01/2023   Atrial fibrillation (HCC) 01/10/2023   Prediabetes 08/29/2022   Migraine headache 08/29/2022   IFG (impaired fasting glucose) 08/29/2022   IBS (irritable bowel syndrome) 08/29/2022   HLD (hyperlipidemia) 08/29/2022   Heart defect 08/29/2022   Emphysema, unspecified (HCC) 08/29/2022   Asthma 08/29/2022   Arthritis 08/29/2022   Allergy 08/29/2022   Meralgia paresthetica 02/10/2021   Vitamin D  deficiency 10/26/2020   Morbid obesity (HCC) 07/01/2017   Anxiety and depression 04/13/2016   Hypothyroidism 07/04/2014   GERD (gastroesophageal reflux disease) 10/26/2011   PPM-St.Jude 02/14/2011   AV BLOCK, COMPLETE 10/28/2010   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 Henderson MD  REFERRING DIAG: Primary OA of left knee s/p total knee replacement  THERAPY DIAG:  Chronic pain of left knee  Stiffness of left knee, not elsewhere classified  Localized edema  Rationale for Evaluation and Treatment: Rehabilitation  ONSET DATE: 08/13/24 (surgery date).    SUBJECTIVE:   SUBJECTIVE STATEMENT: Not sleeping well due to knee pain.    PERTINENT HISTORY: Pacemaker.  See above.   PAIN:  Are you having pain? Yes: NPRS scale: 4/10. Pain location: Left knee. Pain description: Ache, throb.   Aggravating factors: Lying on leg and putting pressure on it.   Relieving factors: Pain medication.    PRECAUTIONS: ICD/Pacemaker.  No ultrasound, no electrical stimulation.    RED FLAGS: None   WEIGHT BEARING RESTRICTIONS: No  FALLS:  Has patient fallen in last 6 months? No  LIVING ENVIRONMENT: Lives with: lives with their spouse Lives in: House/apartment Has following equipment at home: Single point cane  PLOF: Independent  PATIENT GOALS: Do more without pain.      OBJECTIVE:   PATIENT SURVEYS:  LEFS:  27/80.  EDEMA:  Circumferential: Left 4 cms > right.  PALPATION: Minimal anterior left knee tenderness.  Incisional site looks to be healing well.   LOWER EXTREMITY ROM:  -7 degrees to 100 degrees.    LOWER EXTREMITY MMT:  Patient easily able to perform an antigravity left SLR and SAQ.   GAIT: Patient was walking safely with a cane with a decrease in step and stride length.                                                                                                                                 TREATMENT DATE:    09/17/24:  Nustep level 1 moving seat forward x 2 to increase flexion x 15 minutes f/b LLLDS x 4 minutes into flexion f/b STW/M x 10 minutes left patient's left quads (mid-portion) and medial knee.  LE elevation and vasopneumatic on low x 10 minutes.    09/13/24:  Nustep level 1 moving seat forward x 1 to increase knee flexion x 10 minutes f/b  Vasopneumatic on low x 10 minutes.     PATIENT EDUCATION:  Education details:  Person educated:  International aid/development worker:  Education comprehension:   HOME EXERCISE PROGRAM:   ASSESSMENT:  CLINICAL IMPRESSION: Patient notably tender over portions of her left quadriceps.  She did well with STW/M.  OBJECTIVE IMPAIRMENTS: Abnormal gait, decreased activity tolerance, decreased ROM, increased edema, and pain.   ACTIVITY LIMITATIONS: carrying, lifting, bending, and locomotion level  PARTICIPATION LIMITATIONS: meal prep, cleaning, laundry, community activity, and yard work  PERSONAL FACTORS: Time since onset of injury/illness/exacerbation are also affecting patient's functional outcome.   REHAB POTENTIAL: Excellent  CLINICAL DECISION MAKING: Stable/uncomplicated  EVALUATION COMPLEXITY: Low   GOALS:  SHORT TERM GOALS: Target date: 09/27/24  Ind with an initial HEP. Goal status: INITIAL   LONG TERM GOALS: Target date: 12/12/24  Ind with an advanced HEP.  Goal status:  INITIAL  2.  Active knee flexion to 120 degrees+ so the patient can perform functional tasks and do so with pain not > 2-3/10.  Goal status: INITIAL  3.  Perform a reciprocating stair gait with one railing with pain not > 2-3/10.  Goal status: INITIAL  4.  Full active knee extension in order to normalize gait.  Goal status: INITIAL  5.  Perform ADL's with pain not > 3/10.  Goal status: INITIAL  6.  Improve LEFS score by at least 10 points.    Goal status: INITIAL   PLAN:  PT FREQUENCY: 2x/week  PT DURATION: 6 weeks  PLANNED INTERVENTIONS: 97110-Therapeutic exercises, 97530- Therapeutic activity, [id number]- Neuromuscular re-education, 97535- Self Care, [id number]- Manual therapy, 97016- Vasopneumatic device, Patient/Family education, and Cryotherapy  PLAN FOR NEXT SESSION: Nustep, progress to recumbent bike.  ROM, strengthening.     Justun Anaya, [place], PT 09/17/2024, 4:45 PM

## 2024-09-19 ENCOUNTER — Ambulatory Visit

## 2024-09-19 DIAGNOSIS — R600 Localized edema: Secondary | ICD-10-CM

## 2024-09-19 DIAGNOSIS — M25662 Stiffness of left knee, not elsewhere classified: Secondary | ICD-10-CM

## 2024-09-19 DIAGNOSIS — M25562 Pain in left knee: Secondary | ICD-10-CM | POA: Diagnosis not present

## 2024-09-19 DIAGNOSIS — G8929 Other chronic pain: Secondary | ICD-10-CM

## 2024-09-19 NOTE — Therapy (Signed)
 OUTPATIENT PHYSICAL THERAPY LOWER EXTREMITY TREATMENT  Patient Name: [name] MRN: [id number] DOB:03/13/1961, 63 y.o., female Today's Date: 09/19/2024  END OF SESSION:  PT End of Session - 09/19/24 1435     Visit Number 3    Number of Visits 12    Date for Recertification  10/25/24    PT Start Time 1430    PT Stop Time 1524    PT Time Calculation (min) 54 min    Activity Tolerance Patient tolerated treatment well    Behavior During Therapy WFL for tasks assessed/performed          Past Medical History:  Diagnosis Date   Allergy    Anxiety    Arthritis    Asthma    Cataract    Depression    bipolar disorder   Emphysema, unspecified (HCC)    Esophageal stricture    GERD (gastroesophageal reflux disease)    Heart defect    HLD (hyperlipidemia)    Hypothyroidism    IBS (irritable bowel syndrome)    IFG (impaired fasting glucose)    Migraine headache    Obesity    OSA (obstructive sleep apnea)    Pacemaker    Prediabetes    Reflux    Sleep apnea    SOB (shortness of breath)    Symptomatic bradycardia    Past Surgical History:  Procedure Laterality Date   ABDOMINAL HERNIA REPAIR     BREAST CYST EXCISION     left   CATARACT EXTRACTION Bilateral    CESAREAN SECTION  1983. 1987   Cesarean sections     x2   CHOLECYSTECTOMY     COLONOSCOPY     dental implants     foor surgery Right    screws    FOOT SURGERY     screws in left foot   HERNIA REPAIR  2005   PACEMAKER INSERTION     PPM GENERATOR CHANGEOUT N/A 10/07/2022   Procedure: PPM GENERATOR CHANGEOUT;  Surgeon: Waddell Danelle ORN, MD;  Location: [facility];  Service: Cardiovascular;  Laterality: N/A;   TUBAL LIGATION     VAGINAL HYSTERECTOMY     Patient Active Problem List   Diagnosis Date Noted   Moderate episode of recurrent major depressive disorder (HCC) 07/10/2024   Psychophysiological insomnia 07/10/2024   Astigmatism of both eyes with presbyopia 11/24/2023   Myopia of both eyes  with astigmatism and presbyopia 06/01/2023   Nuclear sclerotic cataract of both eyes 06/01/2023   Atrial fibrillation (HCC) 01/10/2023   Prediabetes 08/29/2022   Migraine headache 08/29/2022   IFG (impaired fasting glucose) 08/29/2022   IBS (irritable bowel syndrome) 08/29/2022   HLD (hyperlipidemia) 08/29/2022   Heart defect 08/29/2022   Emphysema, unspecified (HCC) 08/29/2022   Asthma 08/29/2022   Arthritis 08/29/2022   Allergy 08/29/2022   Meralgia paresthetica 02/10/2021   Vitamin D  deficiency 10/26/2020   Morbid obesity (HCC) 07/01/2017   Anxiety and depression 04/13/2016   Hypothyroidism 07/04/2014   GERD (gastroesophageal reflux disease) 10/26/2011   PPM-St.Jude 02/14/2011   AV BLOCK, COMPLETE 10/28/2010   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 Henderson MD  REFERRING DIAG: Primary OA of left knee s/p total knee replacement  THERAPY DIAG:  Chronic pain of left knee  Stiffness of left knee, not elsewhere classified  Localized edema  Rationale for Evaluation and Treatment: Rehabilitation  ONSET DATE: 08/13/24 (surgery date).    SUBJECTIVE:   SUBJECTIVE STATEMENT: Pt reports 4/10 left quad pain.   PERTINENT HISTORY: Pacemaker.  See above.   PAIN:  Are you having pain? Yes: NPRS scale: 4/10. Pain location: Left knee. Pain description: Ache, throb.   Aggravating factors: Lying on leg and putting pressure on it.   Relieving factors: Pain medication.    PRECAUTIONS: ICD/Pacemaker.  No ultrasound, no electrical stimulation.    RED FLAGS: None   WEIGHT BEARING RESTRICTIONS: No  FALLS:  Has patient fallen in last 6 months? No  LIVING ENVIRONMENT: Lives with: lives with their spouse Lives in: House/apartment Has following equipment at home: Single point cane  PLOF: Independent  PATIENT GOALS: Do more without pain.      OBJECTIVE:   PATIENT SURVEYS:  LEFS:  27/80.  EDEMA:  Circumferential: Left 4 cms > right.  PALPATION: Minimal anterior left knee tenderness.  Incisional site looks to be healing well.   LOWER EXTREMITY ROM:  -7 degrees to 100 degrees.    LOWER EXTREMITY MMT:  Patient easily able to perform an antigravity left SLR and SAQ.   GAIT: Patient was walking safely with a cane with a decrease in step and stride length.                                                                                                                                 TREATMENT DATE:   09/19/24                                 EXERCISE LOG  Exercise Repetitions and Resistance Comments  Nustep Lvl 2 x 15 mins; seat 8   Rockerboard 5 mins   Lunges 8 box x 3 mins   LAQs 2# x 20 reps    Seated Marches 2# x 20 reps   Seated Hip Abduction Red x 3 mins   Seated Hip Adduction 3 mins   Seated Ham Curls Red x 20 reps   STSs     Blank cell = exercise not performed today   Manual Therapy Soft Tissue Mobilization: Left quad, STW/M to left quad to decrease pain and tone with pt in supine for comfort with BLEs elevated   09/17/24:  Nustep level 1 moving seat forward x 2 to increase flexion x 15 minutes f/b LLLDS x 4 minutes into flexion f/b STW/M x 10 minutes left patient's left quads (mid-portion) and medial knee.  LE elevation and vasopneumatic on low x 10 minutes.    09/13/24:  Nustep level 1 moving seat forward x 1 to increase knee flexion x 10 minutes f/b  Vasopneumatic on low x 10 minutes.     PATIENT EDUCATION:  Education details:  Person educated:  International aid/development worker:  Education comprehension:   HOME EXERCISE PROGRAM:   ASSESSMENT:  CLINICAL IMPRESSION: Pt arrives for today's  treatment session reporting 4/10 left knee pain. Pt introduced to several standing and seated exercises today to increase strength, function, and ROM.  Pt requiring min cues for proper technique, pacing, and posture with  all newly added exercises.  STW/M performed to left distal quad and IT band to decrease pain and tone.  Pt reported decreased pain at completion of today's treatment session.   OBJECTIVE IMPAIRMENTS: Abnormal gait, decreased activity tolerance, decreased ROM, increased edema, and pain.   ACTIVITY LIMITATIONS: carrying, lifting, bending, and locomotion level  PARTICIPATION LIMITATIONS: meal prep, cleaning, laundry, community activity, and yard work  PERSONAL FACTORS: Time since onset of injury/illness/exacerbation are also affecting patient's functional outcome.   REHAB POTENTIAL: Excellent  CLINICAL DECISION MAKING: Stable/uncomplicated  EVALUATION COMPLEXITY: Low   GOALS:  SHORT TERM GOALS: Target date: 09/27/24  Ind with an initial HEP. Goal status: INITIAL   LONG TERM GOALS: Target date: 12/12/24  Ind with an advanced HEP.  Goal status: INITIAL  2.  Active knee flexion to 120 degrees+ so the patient can perform functional tasks and do so with pain not > 2-3/10.  Goal status: INITIAL  3.  Perform a reciprocating stair gait with one railing with pain not > 2-3/10.  Goal status: INITIAL  4.  Full active knee extension in order to normalize gait.  Goal status: INITIAL  5.  Perform ADL's with pain not > 3/10.  Goal status: INITIAL  6.  Improve LEFS score by at least 10 points.    Goal status: INITIAL   PLAN:  PT FREQUENCY: 2x/week  PT DURATION: 6 weeks  PLANNED INTERVENTIONS: 97110-Therapeutic exercises, 97530- Therapeutic activity, [id number]- Neuromuscular re-education, 97535- Self Care, 02859- Manual therapy, [id number]- Vasopneumatic device, Patient/Family education, and Cryotherapy  PLAN FOR NEXT SESSION: Nustep, progress to recumbent bike.  ROM, strengthening.     Delon DELENA Gosling, PTA 09/19/2024, 3:55 PM

## 2024-09-25 ENCOUNTER — Ambulatory Visit

## 2024-09-25 DIAGNOSIS — G8929 Other chronic pain: Secondary | ICD-10-CM

## 2024-09-25 DIAGNOSIS — M25562 Pain in left knee: Secondary | ICD-10-CM | POA: Diagnosis not present

## 2024-09-25 DIAGNOSIS — R600 Localized edema: Secondary | ICD-10-CM

## 2024-09-25 DIAGNOSIS — M25662 Stiffness of left knee, not elsewhere classified: Secondary | ICD-10-CM

## 2024-09-25 NOTE — Therapy (Signed)
 OUTPATIENT PHYSICAL THERAPY LOWER EXTREMITY TREATMENT  Patient Name: [name] MRN: [id number] DOB:11-06-1961, 63 y.o., female Today's Date: 09/25/2024  END OF SESSION:  PT End of Session - 09/25/24 0804     Visit Number 4    Number of Visits 12    Date for Recertification  10/25/24    PT Start Time 0800    PT Stop Time 0845    PT Time Calculation (min) 45 min    Activity Tolerance Patient tolerated treatment well    Behavior During Therapy WFL for tasks assessed/performed          Past Medical History:  Diagnosis Date   Allergy    Anxiety    Arthritis    Asthma    Cataract    Depression    bipolar disorder   Emphysema, unspecified (HCC)    Esophageal stricture    GERD (gastroesophageal reflux disease)    Heart defect    HLD (hyperlipidemia)    Hypothyroidism    IBS (irritable bowel syndrome)    IFG (impaired fasting glucose)    Migraine headache    Obesity    OSA (obstructive sleep apnea)    Pacemaker    Prediabetes    Reflux    Sleep apnea    SOB (shortness of breath)    Symptomatic bradycardia    Past Surgical History:  Procedure Laterality Date   ABDOMINAL HERNIA REPAIR     BREAST CYST EXCISION     left   CATARACT EXTRACTION Bilateral    CESAREAN SECTION  1983. 1987   Cesarean sections     x2   CHOLECYSTECTOMY     COLONOSCOPY     dental implants     foor surgery Right    screws    FOOT SURGERY     screws in left foot   HERNIA REPAIR  2005   PACEMAKER INSERTION     PPM GENERATOR CHANGEOUT N/A 10/07/2022   Procedure: PPM GENERATOR CHANGEOUT;  Surgeon: Waddell Danelle ORN, MD;  Location: [facility];  Service: Cardiovascular;  Laterality: N/A;   TUBAL LIGATION     VAGINAL HYSTERECTOMY     Patient Active Problem List   Diagnosis Date Noted   Moderate episode of recurrent major depressive disorder (HCC) 07/10/2024   Psychophysiological insomnia 07/10/2024   Astigmatism of both eyes with presbyopia 11/24/2023   Myopia of both eyes  with astigmatism and presbyopia 06/01/2023   Nuclear sclerotic cataract of both eyes 06/01/2023   Atrial fibrillation (HCC) 01/10/2023   Prediabetes 08/29/2022   Migraine headache 08/29/2022   IFG (impaired fasting glucose) 08/29/2022   IBS (irritable bowel syndrome) 08/29/2022   HLD (hyperlipidemia) 08/29/2022   Heart defect 08/29/2022   Emphysema, unspecified (HCC) 08/29/2022   Asthma 08/29/2022   Arthritis 08/29/2022   Allergy 08/29/2022   Meralgia paresthetica 02/10/2021   Vitamin D  deficiency 10/26/2020   Morbid obesity (HCC) 07/01/2017   Anxiety and depression 04/13/2016   Hypothyroidism 07/04/2014   GERD (gastroesophageal reflux disease) 10/26/2011   PPM-St.Jude 02/14/2011   AV BLOCK, COMPLETE 10/28/2010   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 Henderson MD  REFERRING DIAG: Primary OA of left knee s/p total knee replacement  THERAPY DIAG:  Chronic pain of left knee  Stiffness of left knee, not elsewhere classified  Localized edema  Rationale for Evaluation and Treatment: Rehabilitation  ONSET DATE: 08/13/24 (surgery date).    SUBJECTIVE:   SUBJECTIVE STATEMENT: Pt reports 3/10 left quad pain.   PERTINENT HISTORY: Pacemaker.  See above.   PAIN:  Are you having pain? Yes: NPRS scale: 3/10. Pain location: Left knee. Pain description: Ache, throb.   Aggravating factors: Lying on leg and putting pressure on it.   Relieving factors: Pain medication.    PRECAUTIONS: ICD/Pacemaker.  No ultrasound, no electrical stimulation.    RED FLAGS: None   WEIGHT BEARING RESTRICTIONS: No  FALLS:  Has patient fallen in last 6 months? No  LIVING ENVIRONMENT: Lives with: lives with their spouse Lives in: House/apartment Has following equipment at home: Single point cane  PLOF: Independent  PATIENT GOALS: Do more without pain.      OBJECTIVE:   PATIENT SURVEYS:  LEFS:  27/80.  EDEMA:  Circumferential: Left 4 cms > right.  PALPATION: Minimal anterior left knee tenderness.  Incisional site looks to be healing well.   LOWER EXTREMITY ROM:  -7 degrees to 100 degrees.    LOWER EXTREMITY MMT:  Patient easily able to perform an antigravity left SLR and SAQ.   GAIT: Patient was walking safely with a cane with a decrease in step and stride length.                                                                                                                                 TREATMENT DATE:   09/25/24                                 EXERCISE LOG  Exercise Repetitions and Resistance Comments  Nustep Lvl 3 x 15 mins; seat 8-7   Rockerboard 5 mins   Lunges 14 box x 3 mins   Forward Step Ups 6 box x 20 reps   LAQs 2# x 25 reps    Seated Marches 2# x 25 reps   Seated Hip Abduction Red x 3.5 mins   Seated Hip Adduction 3.5 mins   Seated Ham Curls Red x 25 reps   STSs     Blank cell = exercise not performed today   09/17/24:  Nustep level 1 moving seat forward x 2 to increase flexion x 15 minutes f/b LLLDS x 4 minutes into flexion f/b STW/M x 10 minutes left patient's left quads (mid-portion) and medial knee.  LE elevation and vasopneumatic on low x 10 minutes.    09/13/24:  Nustep level 1 moving seat forward x 1 to increase knee flexion x 10 minutes f/b  Vasopneumatic on low x 10 minutes.     PATIENT EDUCATION:  Education details:  Person educated:  International aid/development worker:  Education comprehension:   HOME EXERCISE PROGRAM:   ASSESSMENT:  CLINICAL IMPRESSION: Pt arrives for today's treatment session reporting 3/10 left knee pain. Pt able to progress to seat 7 on the Nustep  today with minimal discomfort noted.  Pt able to tolerate increased step height with forward lunges today for increased ROM.  Pt also able to tolerate increased reps or time with all previously performed exercises.  Pt reported  decreased pain at completion of today's treatment session.   OBJECTIVE IMPAIRMENTS: Abnormal gait, decreased activity tolerance, decreased ROM, increased edema, and pain.   ACTIVITY LIMITATIONS: carrying, lifting, bending, and locomotion level  PARTICIPATION LIMITATIONS: meal prep, cleaning, laundry, community activity, and yard work  PERSONAL FACTORS: Time since onset of injury/illness/exacerbation are also affecting patient's functional outcome.   REHAB POTENTIAL: Excellent  CLINICAL DECISION MAKING: Stable/uncomplicated  EVALUATION COMPLEXITY: Low   GOALS:  SHORT TERM GOALS: Target date: 09/27/24  Ind with an initial HEP. Goal status: INITIAL   LONG TERM GOALS: Target date: 12/12/24  Ind with an advanced HEP.  Goal status: INITIAL  2.  Active knee flexion to 120 degrees+ so the patient can perform functional tasks and do so with pain not > 2-3/10.  Goal status: INITIAL  3.  Perform a reciprocating stair gait with one railing with pain not > 2-3/10.  Goal status: INITIAL  4.  Full active knee extension in order to normalize gait.  Goal status: INITIAL  5.  Perform ADL's with pain not > 3/10.  Goal status: INITIAL  6.  Improve LEFS score by at least 10 points.    Goal status: INITIAL   PLAN:  PT FREQUENCY: 2x/week  PT DURATION: 6 weeks  PLANNED INTERVENTIONS: 97110-Therapeutic exercises, 97530- Therapeutic activity, [id number]- Neuromuscular re-education, 97535- Self Care, [id number]- Manual therapy, [id number]- Vasopneumatic device, Patient/Family education, and Cryotherapy  PLAN FOR NEXT SESSION: Nustep, progress to recumbent bike.  ROM, strengthening.     Delon DELENA Gosling, PTA 09/25/2024, 10:12 AM

## 2024-09-27 ENCOUNTER — Ambulatory Visit: Admitting: Physical Therapy

## 2024-09-27 ENCOUNTER — Encounter: Payer: Self-pay | Admitting: Physical Therapy

## 2024-09-27 DIAGNOSIS — M25662 Stiffness of left knee, not elsewhere classified: Secondary | ICD-10-CM

## 2024-09-27 DIAGNOSIS — R600 Localized edema: Secondary | ICD-10-CM

## 2024-09-27 DIAGNOSIS — M25562 Pain in left knee: Secondary | ICD-10-CM | POA: Diagnosis not present

## 2024-09-27 DIAGNOSIS — G8929 Other chronic pain: Secondary | ICD-10-CM

## 2024-09-27 NOTE — Therapy (Signed)
 OUTPATIENT PHYSICAL THERAPY LOWER EXTREMITY TREATMENT  Patient Name: [name] MRN: [id number] DOB:1961-09-21, 63 y.o., female Today's Date: 09/27/2024  END OF SESSION:  PT End of Session - 09/27/24 1107     Visit Number 5    Number of Visits 12    Date for Recertification  10/25/24    PT Start Time 0837    PT Stop Time 0937    PT Time Calculation (min) 60 min    Activity Tolerance Patient tolerated treatment well    Behavior During Therapy WFL for tasks assessed/performed           Past Medical History:  Diagnosis Date   Allergy    Anxiety    Arthritis    Asthma    Cataract    Depression    bipolar disorder   Emphysema, unspecified (HCC)    Esophageal stricture    GERD (gastroesophageal reflux disease)    Heart defect    HLD (hyperlipidemia)    Hypothyroidism    IBS (irritable bowel syndrome)    IFG (impaired fasting glucose)    Migraine headache    Obesity    OSA (obstructive sleep apnea)    Pacemaker    Prediabetes    Reflux    Sleep apnea    SOB (shortness of breath)    Symptomatic bradycardia    Past Surgical History:  Procedure Laterality Date   ABDOMINAL HERNIA REPAIR     BREAST CYST EXCISION     left   CATARACT EXTRACTION Bilateral    CESAREAN SECTION  1983. 1987   Cesarean sections     x2   CHOLECYSTECTOMY     COLONOSCOPY     dental implants     foor surgery Right    screws    FOOT SURGERY     screws in left foot   HERNIA REPAIR  2005   PACEMAKER INSERTION     PPM GENERATOR CHANGEOUT N/A 10/07/2022   Procedure: PPM GENERATOR CHANGEOUT;  Surgeon: Waddell Danelle ORN, MD;  Location: [facility];  Service: Cardiovascular;  Laterality: N/A;   TUBAL LIGATION     VAGINAL HYSTERECTOMY     Patient Active Problem List   Diagnosis Date Noted   Moderate episode of recurrent major depressive disorder (HCC) 07/10/2024   Psychophysiological insomnia 07/10/2024   Astigmatism of both eyes with presbyopia 11/24/2023   Myopia of both eyes  with astigmatism and presbyopia 06/01/2023   Nuclear sclerotic cataract of both eyes 06/01/2023   Atrial fibrillation (HCC) 01/10/2023   Prediabetes 08/29/2022   Migraine headache 08/29/2022   IFG (impaired fasting glucose) 08/29/2022   IBS (irritable bowel syndrome) 08/29/2022   HLD (hyperlipidemia) 08/29/2022   Heart defect 08/29/2022   Emphysema, unspecified (HCC) 08/29/2022   Asthma 08/29/2022   Arthritis 08/29/2022   Allergy 08/29/2022   Meralgia paresthetica 02/10/2021   Vitamin D  deficiency 10/26/2020   Morbid obesity (HCC) 07/01/2017   Anxiety and depression 04/13/2016   Hypothyroidism 07/04/2014   GERD (gastroesophageal reflux disease) 10/26/2011   PPM-St.Jude 02/14/2011   AV BLOCK, COMPLETE 10/28/2010   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 Henderson MD  REFERRING DIAG: Primary OA of left knee s/p total knee replacement  THERAPY DIAG:  Chronic pain of left knee  Stiffness of left knee, not elsewhere classified  Localized edema  Rationale for Evaluation and Treatment:  Rehabilitation  ONSET DATE: 08/13/24 (surgery date).    SUBJECTIVE:   SUBJECTIVE STATEMENT: Pt reports 3/10 left quad pain.   PERTINENT HISTORY: Pacemaker.  See above.   PAIN:  Are you having pain? Yes: NPRS scale: 3/10. Pain location: Left knee. Pain description: Ache, throb.   Aggravating factors: Lying on leg and putting pressure on it.   Relieving factors: Pain medication.    PRECAUTIONS: ICD/Pacemaker.  No ultrasound, no electrical stimulation.    RED FLAGS: None   WEIGHT BEARING RESTRICTIONS: No  FALLS:  Has patient fallen in last 6 months? No  LIVING ENVIRONMENT: Lives with: lives with their spouse Lives in: House/apartment Has following equipment at home: Single point cane  PLOF: Independent  PATIENT GOALS: Do more without pain.      OBJECTIVE:   PATIENT SURVEYS:  LEFS:  27/80.  EDEMA:  Circumferential: Left 4 cms > right.  PALPATION: Minimal anterior left knee tenderness.  Incisional site looks to be healing well.   LOWER EXTREMITY ROM:  -7 degrees to 100 degrees.    LOWER EXTREMITY MMT:  Patient easily able to perform an antigravity left SLR and SAQ.   GAIT: Patient was walking safely with a cane with a decrease in step and stride length.                                                                                                                                 TREATMENT DATE:                                     EXERCISE LOG  Exercise Repetitions and Resistance Comments  Nustep 15 minutes beginning at seat 7 and finishing at seat 5.   Rockerboard 5 minutes   LAQ's 3# x 5 minutes.   SAQ's 5 minutes.       STW/M x 4 minutes along left ITB and PROM x 4 minutes LLLDS f/b LE elevation and vasopneumatic on low x 10 minutes.     09/25/24                                 EXERCISE LOG  Exercise Repetitions and Resistance Comments  Nustep Lvl 3 x 15 mins; seat 8-7   Rockerboard 5 mins   Lunges 14 box x 3 mins   Forward Step Ups 6 box x 20 reps   LAQs 2# x 25 reps    Seated Marches 2# x 25 reps   Seated Hip Abduction Red x 3.5 mins   Seated Hip Adduction 3.5 mins   Seated Ham Curls Red x 25 reps   STSs     Blank cell = exercise not performed today   09/17/24:  Nustep level 1 moving seat forward x 2 to increase flexion x 15 minutes f/b  LLLDS x 4 minutes into flexion f/b STW/M x 10 minutes left patient's left quads (mid-portion) and medial knee.  LE elevation and vasopneumatic on low x 10 minutes.    09/13/24:  Nustep level 1 moving seat forward x 1 to increase knee flexion x 10 minutes f/b  Vasopneumatic on low x 10 minutes.     PATIENT EDUCATION:  Education details:  Person educated:  International aid/development worker:  Education comprehension:   HOME EXERCISE PROGRAM:   ASSESSMENT:  CLINICAL  IMPRESSION: Patient did well today.  CC of pain along her left ITB.    OBJECTIVE IMPAIRMENTS: Abnormal gait, decreased activity tolerance, decreased ROM, increased edema, and pain.   ACTIVITY LIMITATIONS: carrying, lifting, bending, and locomotion level  PARTICIPATION LIMITATIONS: meal prep, cleaning, laundry, community activity, and yard work  PERSONAL FACTORS: Time since onset of injury/illness/exacerbation are also affecting patient's functional outcome.   REHAB POTENTIAL: Excellent  CLINICAL DECISION MAKING: Stable/uncomplicated  EVALUATION COMPLEXITY: Low   GOALS:  SHORT TERM GOALS: Target date: 09/27/24  Ind with an initial HEP. Goal status: INITIAL   LONG TERM GOALS: Target date: 12/12/24  Ind with an advanced HEP.  Goal status: INITIAL  2.  Active knee flexion to 120 degrees+ so the patient can perform functional tasks and do so with pain not > 2-3/10.  Goal status: INITIAL  3.  Perform a reciprocating stair gait with one railing with pain not > 2-3/10.  Goal status: INITIAL  4.  Full active knee extension in order to normalize gait.  Goal status: INITIAL  5.  Perform ADL's with pain not > 3/10.  Goal status: INITIAL  6.  Improve LEFS score by at least 10 points.    Goal status: INITIAL   PLAN:  PT FREQUENCY: 2x/week  PT DURATION: 6 weeks  PLANNED INTERVENTIONS: 97110-Therapeutic exercises, 97530- Therapeutic activity, [id number]- Neuromuscular re-education, 97535- Self Care, [id number]- Manual therapy, 97016- Vasopneumatic device, Patient/Family education, and Cryotherapy  PLAN FOR NEXT SESSION: Nustep, progress to recumbent bike.  ROM, strengthening.     Amillia Biffle, [place], PT 09/27/2024, 11:20 AM

## 2024-10-02 ENCOUNTER — Ambulatory Visit

## 2024-10-02 DIAGNOSIS — R600 Localized edema: Secondary | ICD-10-CM

## 2024-10-02 DIAGNOSIS — M25662 Stiffness of left knee, not elsewhere classified: Secondary | ICD-10-CM

## 2024-10-02 DIAGNOSIS — G8929 Other chronic pain: Secondary | ICD-10-CM

## 2024-10-02 DIAGNOSIS — M25562 Pain in left knee: Secondary | ICD-10-CM | POA: Diagnosis not present

## 2024-10-02 NOTE — Therapy (Signed)
 OUTPATIENT PHYSICAL THERAPY LOWER EXTREMITY TREATMENT  Patient Name: [name] MRN: [id number] DOB:[birth year], 63 y.o., female Today's Date: 10/02/2024  END OF SESSION:  PT End of Session - 10/02/24 0801     Visit Number 6    Number of Visits 12    Date for Recertification  10/25/24    PT Start Time 0800    PT Stop Time 0848    PT Time Calculation (min) 48 min    Activity Tolerance Patient tolerated treatment well    Behavior During Therapy WFL for tasks assessed/performed           Past Medical History:  Diagnosis Date   Allergy    Anxiety    Arthritis    Asthma    Cataract    Depression    bipolar disorder   Emphysema, unspecified (HCC)    Esophageal stricture    GERD (gastroesophageal reflux disease)    Heart defect    HLD (hyperlipidemia)    Hypothyroidism    IBS (irritable bowel syndrome)    IFG (impaired fasting glucose)    Migraine headache    Obesity    OSA (obstructive sleep apnea)    Pacemaker    Prediabetes    Reflux    Sleep apnea    SOB (shortness of breath)    Symptomatic bradycardia    Past Surgical History:  Procedure Laterality Date   ABDOMINAL HERNIA REPAIR     BREAST CYST EXCISION     left   CATARACT EXTRACTION Bilateral    CESAREAN SECTION  1983. 1987   Cesarean sections     x2   CHOLECYSTECTOMY     COLONOSCOPY     dental implants     foor surgery Right    screws    FOOT SURGERY     screws in left foot   HERNIA REPAIR  2005   PACEMAKER INSERTION     PPM GENERATOR CHANGEOUT N/A 10/07/2022   Procedure: PPM GENERATOR CHANGEOUT;  Surgeon: Waddell Danelle ORN, MD;  Location: [facility];  Service: Cardiovascular;  Laterality: N/A;   TUBAL LIGATION     VAGINAL HYSTERECTOMY     Patient Active Problem List   Diagnosis Date Noted   Moderate episode of recurrent major depressive disorder (HCC) 07/10/2024   Psychophysiological insomnia 07/10/2024   Astigmatism of both eyes with presbyopia 11/24/2023   Myopia of both eyes  with astigmatism and presbyopia 06/01/2023   Nuclear sclerotic cataract of both eyes 06/01/2023   Atrial fibrillation (HCC) 01/10/2023   Prediabetes 08/29/2022   Migraine headache 08/29/2022   IFG (impaired fasting glucose) 08/29/2022   IBS (irritable bowel syndrome) 08/29/2022   HLD (hyperlipidemia) 08/29/2022   Heart defect 08/29/2022   Emphysema, unspecified (HCC) 08/29/2022   Asthma 08/29/2022   Arthritis 08/29/2022   Allergy 08/29/2022   Meralgia paresthetica 02/10/2021   Vitamin D  deficiency 10/26/2020   Morbid obesity (HCC) 07/01/2017   Anxiety and depression 04/13/2016   Hypothyroidism 07/04/2014   GERD (gastroesophageal reflux disease) 10/26/2011   PPM-St.Jude 02/14/2011   AV BLOCK, COMPLETE 10/28/2010   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 Henderson MD  REFERRING DIAG: Primary OA of left knee s/p total knee replacement  THERAPY DIAG:  Chronic pain of left knee  Stiffness of left knee, not elsewhere classified  Localized edema  Rationale for Evaluation and Treatment:  Rehabilitation  ONSET DATE: 08/13/24 (surgery date).    SUBJECTIVE:   SUBJECTIVE STATEMENT: Pt reports 1/10 left knee pain.  Pt reports MD changed her meds with good results.  PERTINENT HISTORY: Pacemaker.  See above.   PAIN:  Are you having pain? Yes: NPRS scale: 1/10. Pain location: Left knee. Pain description: Ache, throb.   Aggravating factors: Lying on leg and putting pressure on it.   Relieving factors: Pain medication.    PRECAUTIONS: ICD/Pacemaker.  No ultrasound, no electrical stimulation.    RED FLAGS: None   WEIGHT BEARING RESTRICTIONS: No  FALLS:  Has patient fallen in last 6 months? No  LIVING ENVIRONMENT: Lives with: lives with their spouse Lives in: House/apartment Has following equipment at home: Single point cane  PLOF:  Independent  PATIENT GOALS: Do more without pain.     OBJECTIVE:   PATIENT SURVEYS:  LEFS:  27/80.  EDEMA:  Circumferential: Left 4 cms > right.  PALPATION: Minimal anterior left knee tenderness.  Incisional site looks to be healing well.   LOWER EXTREMITY ROM:  -7 degrees to 100 degrees.    LOWER EXTREMITY MMT:  Patient easily able to perform an antigravity left SLR and SAQ.   GAIT: Patient was walking safely with a cane with a decrease in step and stride length.                                                                                                                                 TREATMENT DATE:  10/02/24  EXERCISE LOG  Exercise Repetitions and Resistance Comments  Nustep Lvl 3 x 15 mins; seat 7-6   Rockerboard 5 mins   Lunges 14 box x 3 mins   Stairs 4 stairs x 5 reps   LAQs 3# x 25 reps    Seated Marches 3# x 25 reps   Seated Hip Abduction    Seated Hip Adduction    Seated Ham Curls Green x 25 reps   STSs 15 reps    Blank cell = exercise not performed today                                     EXERCISE LOG  Exercise Repetitions and Resistance Comments  Nustep 15 minutes beginning at seat 7 and finishing at seat 5.   Rockerboard 5 minutes   LAQ's 3# x 5 minutes.   SAQ's 5 minutes.       STW/M x 4 minutes along left ITB and PROM x 4 minutes LLLDS f/b LE elevation and vasopneumatic on low x 10 minutes.     09/25/24                                 EXERCISE LOG  Exercise Repetitions and Resistance Comments  Nustep Lvl 3  x 15 mins; seat 8-7   Rockerboard 5 mins   Lunges 14 box x 3 mins   Forward Step Ups 6 box x 20 reps   LAQs 2# x 25 reps    Seated Marches 2# x 25 reps   Seated Hip Abduction Red x 3.5 mins   Seated Hip Adduction 3.5 mins   Seated Ham Curls Red x 25 reps   STSs     Blank cell = exercise not performed today   PATIENT EDUCATION:  Education details:  Person educated:  International aid/development worker:  Education comprehension:   HOME  EXERCISE PROGRAM:   ASSESSMENT:  CLINICAL IMPRESSION: Pt arrives for today's treatment session reporting 1/10 left knee pain.  Pt able to increase LEFs to 55/80 today well surpassing her goal.  Pt able to demonstrate full left knee extension and 109 degress of left knee flexion, making good progress towards her flexion goal.  Pt able to navigate four stairs with reciprocal pattern and very little discomfort.  Pt able to tolerate increased resistance with all seated exercises today.  Pt able to perform cybex knee extension without discomfort.  Pt denied any pain at completion of today's treatment session.  OBJECTIVE IMPAIRMENTS: Abnormal gait, decreased activity tolerance, decreased ROM, increased edema, and pain.   ACTIVITY LIMITATIONS: carrying, lifting, bending, and locomotion level  PARTICIPATION LIMITATIONS: meal prep, cleaning, laundry, community activity, and yard work  PERSONAL FACTORS: Time since onset of injury/illness/exacerbation are also affecting patient's functional outcome.   REHAB POTENTIAL: Excellent  CLINICAL DECISION MAKING: Stable/uncomplicated  EVALUATION COMPLEXITY: Low   GOALS:  SHORT TERM GOALS: Target date: 09/27/24  Ind with an initial HEP. Goal status: MET   LONG TERM GOALS: Target date: 12/12/24  Ind with an advanced HEP.  Goal status: IN PROGRESS  2.  Active knee flexion to 120 degrees+ so the patient can perform functional tasks and do so with pain not > 2-3/10. 10/22: 109 degrees   Goal status: IN PROGRESS  3.  Perform a reciprocating stair gait with one railing with pain not > 2-3/10.  Goal status: MET  4.  Full active knee extension in order to normalize gait.  Goal status: MET  5.  Perform ADL's with pain not > 3/10.   10/22: 3-4/10  Goal status: IN PROGRESS  6.  Improve LEFS score by at least 10 points.   10/22: 55/80  Goal status: MET   PLAN:  PT FREQUENCY: 2x/week  PT DURATION: 6 weeks  PLANNED INTERVENTIONS:  97110-Therapeutic exercises, 97530- Therapeutic activity, 97112- Neuromuscular re-education, 97535- Self Care, 02859- Manual therapy, 97016- Vasopneumatic device, Patient/Family education, and Cryotherapy  PLAN FOR NEXT SESSION: Nustep, progress to recumbent bike.  ROM, strengthening.     Delon DELENA Gosling, PTA 10/02/2024, 9:29 AM

## 2024-10-04 ENCOUNTER — Ambulatory Visit

## 2024-10-04 DIAGNOSIS — M25662 Stiffness of left knee, not elsewhere classified: Secondary | ICD-10-CM

## 2024-10-04 DIAGNOSIS — M25562 Pain in left knee: Secondary | ICD-10-CM | POA: Diagnosis not present

## 2024-10-04 DIAGNOSIS — G8929 Other chronic pain: Secondary | ICD-10-CM

## 2024-10-04 DIAGNOSIS — R600 Localized edema: Secondary | ICD-10-CM

## 2024-10-04 NOTE — Therapy (Signed)
 OUTPATIENT PHYSICAL THERAPY LOWER EXTREMITY TREATMENT  Patient Name: [name] MRN: [id number] DOB:1960/12/31, 63 y.o., female Today's Date: 10/04/2024  END OF SESSION:  PT End of Session - 10/04/24 0803     Visit Number 7    Number of Visits 12    Date for Recertification  10/25/24    PT Start Time 0800    PT Stop Time 0842    PT Time Calculation (min) 42 min    Activity Tolerance Patient tolerated treatment well    Behavior During Therapy WFL for tasks assessed/performed           Past Medical History:  Diagnosis Date   Allergy    Anxiety    Arthritis    Asthma    Cataract    Depression    bipolar disorder   Emphysema, unspecified (HCC)    Esophageal stricture    GERD (gastroesophageal reflux disease)    Heart defect    HLD (hyperlipidemia)    Hypothyroidism    IBS (irritable bowel syndrome)    IFG (impaired fasting glucose)    Migraine headache    Obesity    OSA (obstructive sleep apnea)    Pacemaker    Prediabetes    Reflux    Sleep apnea    SOB (shortness of breath)    Symptomatic bradycardia    Past Surgical History:  Procedure Laterality Date   ABDOMINAL HERNIA REPAIR     BREAST CYST EXCISION     left   CATARACT EXTRACTION Bilateral    CESAREAN SECTION  1983. 1987   Cesarean sections     x2   CHOLECYSTECTOMY     COLONOSCOPY     dental implants     foor surgery Right    screws    FOOT SURGERY     screws in left foot   HERNIA REPAIR  2005   PACEMAKER INSERTION     PPM GENERATOR CHANGEOUT N/A 10/07/2022   Procedure: PPM GENERATOR CHANGEOUT;  Surgeon: Waddell Danelle ORN, MD;  Location: [facility];  Service: Cardiovascular;  Laterality: N/A;   TUBAL LIGATION     VAGINAL HYSTERECTOMY     Patient Active Problem List   Diagnosis Date Noted   Moderate episode of recurrent major depressive disorder (HCC) 07/10/2024   Psychophysiological insomnia 07/10/2024   Astigmatism of both eyes with presbyopia 11/24/2023   Myopia of both eyes  with astigmatism and presbyopia 06/01/2023   Nuclear sclerotic cataract of both eyes 06/01/2023   Atrial fibrillation (HCC) 01/10/2023   Prediabetes 08/29/2022   Migraine headache 08/29/2022   IFG (impaired fasting glucose) 08/29/2022   IBS (irritable bowel syndrome) 08/29/2022   HLD (hyperlipidemia) 08/29/2022   Heart defect 08/29/2022   Emphysema, unspecified (HCC) 08/29/2022   Asthma 08/29/2022   Arthritis 08/29/2022   Allergy 08/29/2022   Meralgia paresthetica 02/10/2021   Vitamin D  deficiency 10/26/2020   Morbid obesity (HCC) 07/01/2017   Anxiety and depression 04/13/2016   Hypothyroidism 07/04/2014   GERD (gastroesophageal reflux disease) 10/26/2011   PPM-St.Jude 02/14/2011   AV BLOCK, COMPLETE 10/28/2010   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 Henderson MD  REFERRING DIAG: Primary OA of left knee s/p total knee replacement  THERAPY DIAG:  Chronic pain of left knee  Stiffness of left knee, not elsewhere classified  Localized edema  Rationale for Evaluation and Treatment:  Rehabilitation  ONSET DATE: 08/13/24 (surgery date).    SUBJECTIVE:   SUBJECTIVE STATEMENT: Pt reports 1/10 left knee pain.    PERTINENT HISTORY: Pacemaker.  See above.   PAIN:  Are you having pain? No  PRECAUTIONS: ICD/Pacemaker.  No ultrasound, no electrical stimulation.    RED FLAGS: None   WEIGHT BEARING RESTRICTIONS: No  FALLS:  Has patient fallen in last 6 months? No  LIVING ENVIRONMENT: Lives with: lives with their spouse Lives in: House/apartment Has following equipment at home: Single point cane  PLOF: Independent  PATIENT GOALS: Do more without pain.     OBJECTIVE:   PATIENT SURVEYS:  LEFS:  27/80.  EDEMA:  Circumferential: Left 4 cms > right.  PALPATION: Minimal anterior left knee tenderness.  Incisional site looks to be  healing well.   LOWER EXTREMITY ROM:  -7 degrees to 100 degrees.    LOWER EXTREMITY MMT:  Patient easily able to perform an antigravity left SLR and SAQ.   GAIT: Patient was walking safely with a cane with a decrease in step and stride length.                                                                                                                                 TREATMENT DATE:  10/04/24  EXERCISE LOG  Exercise Repetitions and Resistance Comments  Nustep Lvl 3 x 15 mins; seat 6-5   Cybex Knee Flexion 30# x 3.5 mins   Cybex Knee Extension 10# x 3.5 mins   Leg Press 2 plates; seat 6; x 3.5 mins   Rockerboard 5 mins   Lunges 14 box x 3 mins   Step Downs 6 box x 20 reps   LAQs    Seated Marches    Seated Hip Abduction    Seated Hip Adduction    Seated Ham Curls    STSs 20 reps    Blank cell = exercise not performed today                                     EXERCISE LOG  Exercise Repetitions and Resistance Comments  Nustep 15 minutes beginning at seat 7 and finishing at seat 5.   Rockerboard 5 minutes   LAQ's 3# x 5 minutes.   SAQ's 5 minutes.       STW/M x 4 minutes along left ITB and PROM x 4 minutes LLLDS f/b LE elevation and vasopneumatic on low x 10 minutes.     09/25/24                                 EXERCISE LOG  Exercise Repetitions and Resistance Comments  Nustep Lvl 3 x 15 mins; seat 8-7   Rockerboard 5 mins   Lunges 14 box x 3 mins  Forward Step Ups 6 box x 20 reps   LAQs 2# x 25 reps    Seated Marches 2# x 25 reps   Seated Hip Abduction Red x 3.5 mins   Seated Hip Adduction 3.5 mins   Seated Ham Curls Red x 25 reps   STSs     Blank cell = exercise not performed today   PATIENT EDUCATION:  Education details:  Person educated:  International aid/development worker:  Education comprehension:   HOME EXERCISE PROGRAM:   ASSESSMENT:  CLINICAL IMPRESSION: Pt arrives for today's treatment session denying any pain.  Pt states that medication has really  helped with her pain.  Pt introduced to cybex machinery today with min cues for eccentric control.  Pt also introduced to step down exercises today to strengthen left quad.  Pt denied any pain at completion of today's treatment session.   OBJECTIVE IMPAIRMENTS: Abnormal gait, decreased activity tolerance, decreased ROM, increased edema, and pain.   ACTIVITY LIMITATIONS: carrying, lifting, bending, and locomotion level  PARTICIPATION LIMITATIONS: meal prep, cleaning, laundry, community activity, and yard work  PERSONAL FACTORS: Time since onset of injury/illness/exacerbation are also affecting patient's functional outcome.   REHAB POTENTIAL: Excellent  CLINICAL DECISION MAKING: Stable/uncomplicated  EVALUATION COMPLEXITY: Low   GOALS:  SHORT TERM GOALS: Target date: 09/27/24  Ind with an initial HEP. Goal status: MET   LONG TERM GOALS: Target date: 12/12/24  Ind with an advanced HEP.  Goal status: IN PROGRESS  2.  Active knee flexion to 120 degrees+ so the patient can perform functional tasks and do so with pain not > 2-3/10. 10/22: 109 degrees   Goal status: IN PROGRESS  3.  Perform a reciprocating stair gait with one railing with pain not > 2-3/10.  Goal status: MET  4.  Full active knee extension in order to normalize gait.  Goal status: MET  5.  Perform ADL's with pain not > 3/10.   10/22: 3-4/10  Goal status: IN PROGRESS  6.  Improve LEFS score by at least 10 points.   10/22: 55/80  Goal status: MET   PLAN:  PT FREQUENCY: 2x/week  PT DURATION: 6 weeks  PLANNED INTERVENTIONS: 97110-Therapeutic exercises, 97530- Therapeutic activity, 97112- Neuromuscular re-education, 97535- Self Care, 02859- Manual therapy, 97016- Vasopneumatic device, Patient/Family education, and Cryotherapy  PLAN FOR NEXT SESSION: Nustep, progress to recumbent bike.  ROM, strengthening.     Delon DELENA Gosling, PTA 10/04/2024, 10:12 AM

## 2024-10-07 ENCOUNTER — Ambulatory Visit (INDEPENDENT_AMBULATORY_CARE_PROVIDER_SITE_OTHER): Payer: 59

## 2024-10-07 DIAGNOSIS — I442 Atrioventricular block, complete: Secondary | ICD-10-CM

## 2024-10-07 LAB — CUP PACEART REMOTE DEVICE CHECK
Battery Remaining Longevity: 115 mo
Battery Remaining Percentage: 87 %
Battery Voltage: 3.01 V
Brady Statistic AP VP Percent: 1 %
Brady Statistic AP VS Percent: 3.9 %
Brady Statistic AS VP Percent: 1 %
Brady Statistic AS VS Percent: 96 %
Brady Statistic RA Percent Paced: 3.4 %
Brady Statistic RV Percent Paced: 1 %
Date Time Interrogation Session: 20251027020015
Implantable Lead Connection Status: 753985
Implantable Lead Connection Status: 753985
Implantable Lead Implant Date: 20111130
Implantable Lead Implant Date: 20111130
Implantable Lead Location: 753859
Implantable Lead Location: 753860
Implantable Pulse Generator Implant Date: 20231027
Lead Channel Impedance Value: 360 Ohm
Lead Channel Impedance Value: 400 Ohm
Lead Channel Pacing Threshold Amplitude: 0.75 V
Lead Channel Pacing Threshold Amplitude: 1.625 V
Lead Channel Pacing Threshold Pulse Width: 0.4 ms
Lead Channel Pacing Threshold Pulse Width: 0.4 ms
Lead Channel Sensing Intrinsic Amplitude: 2.9 mV
Lead Channel Sensing Intrinsic Amplitude: 4.2 mV
Lead Channel Setting Pacing Amplitude: 1.875
Lead Channel Setting Pacing Amplitude: 2 V
Lead Channel Setting Pacing Pulse Width: 0.4 ms
Lead Channel Setting Sensing Sensitivity: 2 mV
Pulse Gen Model: 2272
Pulse Gen Serial Number: 8125938

## 2024-10-09 ENCOUNTER — Ambulatory Visit

## 2024-10-09 DIAGNOSIS — M25662 Stiffness of left knee, not elsewhere classified: Secondary | ICD-10-CM

## 2024-10-09 DIAGNOSIS — M25562 Pain in left knee: Secondary | ICD-10-CM | POA: Diagnosis not present

## 2024-10-09 DIAGNOSIS — G8929 Other chronic pain: Secondary | ICD-10-CM

## 2024-10-09 DIAGNOSIS — R600 Localized edema: Secondary | ICD-10-CM

## 2024-10-09 NOTE — Therapy (Signed)
 OUTPATIENT PHYSICAL THERAPY LOWER EXTREMITY TREATMENT  Patient Name: [name] MRN: [id number] DOB:03-23-1961, 63 y.o., female Today's Date: 10/09/2024  END OF SESSION:  PT End of Session - 10/09/24 0804     Visit Number 8    Number of Visits 12    Date for Recertification  10/25/24    PT Start Time 0800    PT Stop Time 0843    PT Time Calculation (min) 43 min    Activity Tolerance Patient tolerated treatment well    Behavior During Therapy WFL for tasks assessed/performed           Past Medical History:  Diagnosis Date   Allergy    Anxiety    Arthritis    Asthma    Cataract    Depression    bipolar disorder   Emphysema, unspecified (HCC)    Esophageal stricture    GERD (gastroesophageal reflux disease)    Heart defect    HLD (hyperlipidemia)    Hypothyroidism    IBS (irritable bowel syndrome)    IFG (impaired fasting glucose)    Migraine headache    Obesity    OSA (obstructive sleep apnea)    Pacemaker    Prediabetes    Reflux    Sleep apnea    SOB (shortness of breath)    Symptomatic bradycardia    Past Surgical History:  Procedure Laterality Date   ABDOMINAL HERNIA REPAIR     BREAST CYST EXCISION     left   CATARACT EXTRACTION Bilateral    CESAREAN SECTION  1983. 1987   Cesarean sections     x2   CHOLECYSTECTOMY     COLONOSCOPY     dental implants     foor surgery Right    screws    FOOT SURGERY     screws in left foot   HERNIA REPAIR  2005   PACEMAKER INSERTION     PPM GENERATOR CHANGEOUT N/A 10/07/2022   Procedure: PPM GENERATOR CHANGEOUT;  Surgeon: Waddell Danelle ORN, MD;  Location: [facility];  Service: Cardiovascular;  Laterality: N/A;   TUBAL LIGATION     VAGINAL HYSTERECTOMY     Patient Active Problem List   Diagnosis Date Noted   Moderate episode of recurrent major depressive disorder (HCC) 07/10/2024   Psychophysiological insomnia 07/10/2024   Astigmatism of both eyes with presbyopia 11/24/2023   Myopia of both eyes  with astigmatism and presbyopia 06/01/2023   Nuclear sclerotic cataract of both eyes 06/01/2023   Atrial fibrillation (HCC) 01/10/2023   Prediabetes 08/29/2022   Migraine headache 08/29/2022   IFG (impaired fasting glucose) 08/29/2022   IBS (irritable bowel syndrome) 08/29/2022   HLD (hyperlipidemia) 08/29/2022   Heart defect 08/29/2022   Emphysema, unspecified (HCC) 08/29/2022   Asthma 08/29/2022   Arthritis 08/29/2022   Allergy 08/29/2022   Meralgia paresthetica 02/10/2021   Vitamin D  deficiency 10/26/2020   Morbid obesity (HCC) 07/01/2017   Anxiety and depression 04/13/2016   Hypothyroidism 07/04/2014   GERD (gastroesophageal reflux disease) 10/26/2011   PPM-[facility] 02/14/2011   AV BLOCK, COMPLETE 10/28/2010   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 Henderson MD  REFERRING DIAG: Primary OA of left knee s/p total knee replacement  THERAPY DIAG:  Chronic pain of left knee  Stiffness of left knee, not elsewhere classified  Localized edema  Rationale for Evaluation and Treatment:  Rehabilitation  ONSET DATE: 08/13/24 (surgery date).    SUBJECTIVE:   SUBJECTIVE STATEMENT: Pt denies any knee pain, but has left hand bandaged due to cut and stitches that occurred earlier in the week.     PERTINENT HISTORY: Pacemaker.  See above.   PAIN:  Are you having pain? No  PRECAUTIONS: ICD/Pacemaker.  No ultrasound, no electrical stimulation.    RED FLAGS: None   WEIGHT BEARING RESTRICTIONS: No  FALLS:  Has patient fallen in last 6 months? No  LIVING ENVIRONMENT: Lives with: lives with their spouse Lives in: House/apartment Has following equipment at home: Single point cane  PLOF: Independent  PATIENT GOALS: Do more without pain.   OBJECTIVE:   PATIENT SURVEYS:  LEFS:  27/80.  EDEMA:  Circumferential: Left 4 cms >  right.  PALPATION: Minimal anterior left knee tenderness.  Incisional site looks to be healing well.   LOWER EXTREMITY ROM:  -7 degrees to 100 degrees.    LOWER EXTREMITY MMT:  Patient easily able to perform an antigravity left SLR and SAQ.   GAIT: Patient was walking safely with a cane with a decrease in step and stride length.                                                                                                                                 TREATMENT DATE:  10/04/24  EXERCISE LOG  Exercise Repetitions and Resistance Comments  Nustep Lvl 3 x 15 mins; seat 6-5   Cybex Knee Flexion 30# x 4 mins   Cybex Knee Extension 10# x 4 mins   Leg Press 2 plates; seat 6; x 4 mins   Rockerboard 5 mins   Lunges 14 box x 4 mins   Step Downs 6 box x 30 reps   LAQs    Seated Marches    Seated Hip Abduction    Seated Hip Adduction    Seated Ham Curls    STSs     Blank cell = exercise not performed today                                     EXERCISE LOG  Exercise Repetitions and Resistance Comments  Nustep 15 minutes beginning at seat 7 and finishing at seat 5.   Rockerboard 5 minutes   LAQ's 3# x 5 minutes.   SAQ's 5 minutes.       STW/M x 4 minutes along left ITB and PROM x 4 minutes LLLDS f/b LE elevation and vasopneumatic on low x 10 minutes.     09/25/24                                 EXERCISE LOG  Exercise Repetitions and Resistance Comments  Nustep Lvl 3 x 15 mins; seat 8-7  Rockerboard 5 mins   Lunges 14 box x 3 mins   Forward Step Ups 6 box x 20 reps   LAQs 2# x 25 reps    Seated Marches 2# x 25 reps   Seated Hip Abduction Red x 3.5 mins   Seated Hip Adduction 3.5 mins   Seated Ham Curls Red x 25 reps   STSs     Blank cell = exercise not performed today   PATIENT EDUCATION:  Education details:  Person educated:  International aid/development worker:  Education comprehension:   HOME EXERCISE PROGRAM:   ASSESSMENT:  CLINICAL IMPRESSION: Pt arrives for  today's treatment session denying any pain.  Pt with right hand bandaged due to cut and stitches received earlier in the week.  Pt able to tolerate increased time with all cybex exercises today with minimal fatigue noted.  Pt able to navigate stairs using reciprocal pattern with ease.  Pt denied any pain at completion of today's treatment session.  OBJECTIVE IMPAIRMENTS: Abnormal gait, decreased activity tolerance, decreased ROM, increased edema, and pain.   ACTIVITY LIMITATIONS: carrying, lifting, bending, and locomotion level  PARTICIPATION LIMITATIONS: meal prep, cleaning, laundry, community activity, and yard work  PERSONAL FACTORS: Time since onset of injury/illness/exacerbation are also affecting patient's functional outcome.   REHAB POTENTIAL: Excellent  CLINICAL DECISION MAKING: Stable/uncomplicated  EVALUATION COMPLEXITY: Low   GOALS:  SHORT TERM GOALS: Target date: 09/27/24  Ind with an initial HEP. Goal status: MET   LONG TERM GOALS: Target date: 12/12/24  Ind with an advanced HEP.  Goal status: IN PROGRESS  2.  Active knee flexion to 120 degrees+ so the patient can perform functional tasks and do so with pain not > 2-3/10. 10/22: 109 degrees   Goal status: IN PROGRESS  3.  Perform a reciprocating stair gait with one railing with pain not > 2-3/10.  Goal status: MET  4.  Full active knee extension in order to normalize gait.  Goal status: MET  5.  Perform ADL's with pain not > 3/10.   10/22: 3-4/10  Goal status: IN PROGRESS  6.  Improve LEFS score by at least 10 points.   10/22: 55/80  Goal status: MET   PLAN:  PT FREQUENCY: 2x/week  PT DURATION: 6 weeks  PLANNED INTERVENTIONS: 97110-Therapeutic exercises, 97530- Therapeutic activity, 97112- Neuromuscular re-education, 97535- Self Care, [id number]- Manual therapy, [id number]- Vasopneumatic device, Patient/Family education, and Cryotherapy  PLAN FOR NEXT SESSION: Nustep, progress to recumbent bike.  ROM,  strengthening.     Delon DELENA Gosling, PTA 10/09/2024, 9:11 AM

## 2024-10-10 ENCOUNTER — Ambulatory Visit: Payer: Self-pay | Admitting: Internal Medicine

## 2024-10-10 NOTE — Progress Notes (Signed)
 Subjective:  Patient ID: [name], female    DOB: 1961/05/02, 63 y.o.   MRN: [id number]  Patient Care Team: Deitra Morton Hummer, Nena, NP as PCP - General (Nurse Practitioner) Waddell Danelle ORN, MD as PCP - Electrophysiology (Cardiology)   Chief Complaint:  Suture / Staple Removal (Left hand suture removal ), Diarrhea (Symptoms started Saturday ), and Nausea   HPI: [name] is a 63 y.o. female presenting on 10/14/2024 for Suture / Staple Removal (Left hand suture removal ), Diarrhea (Symptoms started Saturday ), and Nausea   Discussed the use of AI scribe software for clinical note transcription with the patient, who gave verbal consent to proceed.  History of Present Illness [name] is a 63 year old female who presents for stitches removal and reports symptoms of diarrhea, nausea, headache, and hot and cold flashes.  She presents for the removal of stitches from her left hand and arm, which were placed due to a kitchen knife injury. The lacerations include a 1.5 cm cut on the left arm and a 2.5 cm linear cut on the left hand palmar surface.  Over the weekend, she experienced diarrhea, nausea, headache, and hot and cold flashes. The hot and cold flashes are described as alternating sensations of being hot and then cold. No fever was noted when checked at home, and there is minimal coughing. Pepto-Bismol alleviated the diarrhea, but nausea persists despite taking Tylenol .  She reports a decreased appetite since Saturday, having consumed minimal food, including half of a yeast roll, four bites of a hamburger, and a few fries. She is attempting to increase her water intake.  She smokes approximately ten cigarettes a week and has a history of mild emphysema or COPD. She denies fever and muscle pain, and reports only minimal coughing.     Relevant past medical, surgical, family, and social history reviewed and updated as indicated.  Allergies and medications reviewed  and updated. Data reviewed: Chart in Epic.   Past Medical History:  Diagnosis Date   Allergy    Anxiety    Arthritis    Asthma    Cataract    Depression    bipolar disorder   Emphysema, unspecified (HCC)    Esophageal stricture    GERD (gastroesophageal reflux disease)    Heart defect    HLD (hyperlipidemia)    Hypothyroidism    IBS (irritable bowel syndrome)    IFG (impaired fasting glucose)    Migraine headache    Obesity    OSA (obstructive sleep apnea)    Pacemaker    Prediabetes    Reflux    Sleep apnea    SOB (shortness of breath)    Symptomatic bradycardia     Past Surgical History:  Procedure Laterality Date   ABDOMINAL HERNIA REPAIR     BREAST CYST EXCISION     left   CATARACT EXTRACTION Bilateral    CESAREAN SECTION  1983. 1987   Cesarean sections     x2   CHOLECYSTECTOMY     COLONOSCOPY     dental implants     foor surgery Right    screws    FOOT SURGERY     screws in left foot   HERNIA REPAIR  2005   PACEMAKER INSERTION     PPM GENERATOR CHANGEOUT N/A 10/07/2022   Procedure: PPM GENERATOR CHANGEOUT;  Surgeon: Waddell Danelle ORN, MD;  Location: [facility];  Service: Cardiovascular;  Laterality: N/A;  REPLACEMENT TOTAL KNEE Left    TUBAL LIGATION     VAGINAL HYSTERECTOMY      Social History   Socioeconomic History   Marital status: Married    Spouse name: Lynwood   Number of children: 2   Years of education: Not on file   Highest education level: GED or equivalent  Occupational History   Occupation: retired    Associate Professor: [employer]   Occupation: Nana, Housewife  Tobacco Use   Smoking status: Former    Current packs/day: 0.00    Types: Cigarettes    Quit date: 07/08/2022    Years since quitting: 2.2   Smokeless tobacco: Never  Vaping Use   Vaping status: Former  Substance and Sexual Activity   Alcohol use: No    Alcohol/week: 0.0 standard drinks of alcohol    Comment: Occ.    Drug use: No   Sexual activity: Not on file   Other Topics Concern   Not on file  Social History Narrative   Unemployed. 10 cans of soda per day.    Social Drivers of Corporate Investment Banker Strain: Low Risk  (10/10/2024)   Overall Financial Resource Strain (CARDIA)    Difficulty of Paying Living Expenses: Not very hard  Food Insecurity: No Food Insecurity (10/10/2024)   Hunger Vital Sign    Worried About Running Out of Food in the Last Year: Never true    Ran Out of Food in the Last Year: Never true  Transportation Needs: No Transportation Needs (10/10/2024)   PRAPARE - Administrator, Civil Service (Medical): No    Lack of Transportation (Non-Medical): No  Physical Activity: Insufficiently Active (10/10/2024)   Exercise Vital Sign    Days of Exercise per Week: 1 day    Minutes of Exercise per Session: 10 min  Stress: Stress Concern Present (10/10/2024)   [employer] of Occupational Health - Occupational Stress Questionnaire    Feeling of Stress: Rather much  Social Connections: Moderately Isolated (10/10/2024)   Social Connection and Isolation Panel    Frequency of Communication with Friends and Family: Three times a week    Frequency of Social Gatherings with Friends and Family: Patient declined    Attends Religious Services: Patient declined    Active Member of Clubs or Organizations: No    Attends Engineer, Structural: Not on file    Marital Status: Married  Catering Manager Violence: Not At Risk (08/13/2024)   Received from [facility]   HITS    Over the last 12 months how often did your partner physically hurt you?: Never    Over the last 12 months how often did your partner insult you or talk down to you?: Never    Over the last 12 months how often did your partner threaten you with physical harm?: Never    Over the last 12 months how often did your partner scream or curse at you?: Never    Outpatient Encounter Medications as of 10/14/2024  Medication Sig   albuterol  (VENTOLIN   HFA) 108 (90 Base) MCG/ACT inhaler INHALE 2 PUFFS BY MOUTH EVERY 4 HOURS AS NEEDED FOR WHEEZING OR SHORTNESS OF BREATH   budeson-glycopyrrolate-formoterol (BREZTRI  AEROSPHERE) 160-9-4.8 MCG/ACT AERO inhaler Inhale 2 puffs into the lungs 2 (two) times daily.   escitalopram  (LEXAPRO ) 10 MG tablet Take 1 tablet by mouth once daily   levothyroxine  (SYNTHROID ) 100 MCG tablet Take 100 mcg by mouth daily.   omega-3 acid ethyl esters (LOVAZA)  1 g capsule Take by mouth.   omeprazole  (PRILOSEC) 20 MG capsule Take 1 capsule (20 mg total) by mouth 2 (two) times daily. (Patient taking differently: Take 20 mg by mouth 2 (two) times daily as needed (Heartburn).)   ondansetron  (ZOFRAN -ODT) 4 MG disintegrating tablet Take 1 tablet (4 mg total) by mouth every 8 (eight) hours as needed for nausea or vomiting.   prednisoLONE acetate (PRED FORTE) 1 % ophthalmic suspension Place 1 drop into the right eye 4 (four) times daily.   rosuvastatin  (CRESTOR ) 10 MG tablet TAKE 1 TABLET BY MOUTH ONCE DAILY FOR CHOLESTEROL   traZODone  (DESYREL ) 100 MG tablet Take 1 tablet (100 mg total) by mouth at bedtime.   Vitamin D , Ergocalciferol , (DRISDOL ) 1.25 MG (50000 UNIT) CAPS capsule Take 1 capsule (50,000 Units total) by mouth every 7 (seven) days.   benzonatate  (TESSALON  PERLES) 100 MG capsule Take 1 capsule (100 mg total) by mouth 3 (three) times daily as needed. (Patient not taking: Reported on 10/14/2024)   [EXPIRED] ondansetron  (ZOFRAN -ODT) disintegrating tablet 4 mg    No facility-administered encounter medications on file as of 10/14/2024.    Allergies  Allergen Reactions   Misc. Sulfonamide Containing Compounds Nausea And Vomiting and Other (See Comments)   Codeine Other (See Comments)    Migraines   Sulfonamide Derivatives Nausea And Vomiting   Betadine [Povidone Iodine] Other (See Comments)    Blisters    Paxil  [Paroxetine  Hcl] Other (See Comments)    Extreme sweating   Vioxx [Rofecoxib] Rash    Review of Systems   Constitutional:  Positive for chills. Negative for fever.  HENT:  Negative for congestion and sore throat.   Respiratory:  Negative for cough and shortness of breath.   Cardiovascular:  Negative for chest pain and leg swelling.  Gastrointestinal:  Positive for nausea and vomiting. Negative for blood in stool and diarrhea.       No diarrhea since yesterday post pepto  Musculoskeletal:  Negative for falls.  Skin:  Negative for itching and rash.  Neurological:  Positive for headaches. Negative for dizziness.       Relive with Tylenol          Objective:  BP 133/71   Pulse 64   Temp 97.7 F (36.5 C) (Temporal)   Ht 5' 5 (1.651 m)   Wt 205 lb 9.6 oz (93.3 kg)   SpO2 98%   BMI 34.21 kg/m    Wt Readings from Last 3 Encounters:  10/14/24 205 lb 9.6 oz (93.3 kg)  07/10/24 206 lb (93.4 kg)  04/05/24 202 lb (91.6 kg)    Physical Exam Vitals and nursing note reviewed.  Constitutional:      Appearance: She is obese.  HENT:     Head: Normocephalic and atraumatic.     Nose: Nose normal.  Eyes:     General: No scleral icterus.    Extraocular Movements: Extraocular movements intact.     Conjunctiva/sclera: Conjunctivae normal.  Cardiovascular:     Heart sounds: Normal heart sounds.  Pulmonary:     Effort: Pulmonary effort is normal.     Breath sounds: Normal breath sounds.  Abdominal:     General: Bowel sounds are normal.     Palpations: Abdomen is soft.  Musculoskeletal:        General: Normal range of motion.     Right lower leg: No edema.     Left lower leg: No edema.  Skin:    General: Skin is warm.  Comments: Laceration well approximated, no erythema os signs of infection  Neurological:     Mental Status: She is alert and oriented to person, place, and time. Mental status is at baseline.  Psychiatric:        Mood and Affect: Mood normal.        Behavior: Behavior normal.        Thought Content: Thought content normal.        Judgment: Judgment normal.     Physical Exam      Results for orders placed or performed in visit on 10/07/24  CUP PACEART REMOTE DEVICE CHECK   Collection Time: 10/07/24  2:00 AM  Result Value Ref Range   Date Time Interrogation Session [id number]    Pulse Generator Manufacturer [employer]    Pulse Gen Model 2272 Assurity MRI    Pulse Gen Serial Number [id number]    Clinic Name [facility]    Implantable Pulse Generator Type Implantable Pulse Generator    Implantable Pulse Generator Implant Date 79768972    Implantable Lead Manufacturer [facility] [facility] [employer]    Implantable Lead Model [id number] Tendril STS    Implantable Lead Serial Number [id number]    Implantable Lead Implant Date [id number]    Implantable Lead Location [id number]    Implantable Lead Connection Status [id number]    Implantable Lead Manufacturer [facility]    Implantable Lead Model [phone] Tendril STS    Implantable Lead Serial Number [id number]    Implantable Lead Implant Date [id number]    Implantable Lead Location [id number]    Implantable Lead Connection Status [id number]    Lead Channel Setting Sensing Sensitivity 2.0 mV   Lead Channel Setting Sensing Adaptation Mode Fixed Pacing    Lead Channel Setting Pacing Amplitude 2.0 V   Lead Channel Setting Pacing Pulse Width 0.4 ms   Lead Channel Setting Pacing Amplitude 1.875    Lead Channel Status NULL    Lead Channel Impedance Value 360 ohm   Lead Channel Sensing Intrinsic Amplitude 2.9 mV   Lead Channel Pacing Threshold Amplitude 0.75 V   Lead Channel Pacing Threshold Pulse Width 0.4 ms   Lead Channel Status NULL    Lead Channel Impedance Value 400 ohm   Lead Channel Sensing Intrinsic Amplitude 4.2 mV   Lead Channel Pacing Threshold Amplitude 1.625 V   Lead Channel Pacing Threshold Pulse Width 0.4 ms   Battery Status MOS    Battery Remaining Longevity 115 mo   Battery Remaining Percentage 87.0 %   Battery Voltage 3.01 V   Brady Statistic RA Percent Paced 3.4 %   Brady Statistic RV Percent Paced 1.0 %   Brady Statistic AP VP  Percent 1.0 %   Brady Statistic AS VP Percent 1.0 %   Brady Statistic AP VS Percent 3.9 %   Brady Statistic AS VS Percent 96.0 %      Flu A/B  negative  Pertinent labs & imaging results that were available during my care of the patient were reviewed by me and considered in my medical decision making.  Assessment & Plan:  Annete was seen today for suture / staple removal, diarrhea and nausea.  Diagnoses and all orders for this visit:  Encounter for removal of sutures  Nausea and vomiting, unspecified vomiting type -     ondansetron  (ZOFRAN -ODT) disintegrating tablet 4 mg -     ondansetron  (ZOFRAN -ODT) 4 MG disintegrating tablet; Take 1 tablet (4 mg total) by mouth every 8 (eight) hours as needed for nausea or vomiting.  Flu-like symptoms -     Veritor Flu A/B Waived -     ondansetron  (ZOFRAN -ODT) 4 MG disintegrating tablet; Take 1 tablet (4 mg total) by mouth every 8 (eight) hours as needed for nausea or vomiting.  Screening mammogram for breast cancer -     MM 3D SCREENING MAMMOGRAM BILATERAL BREAST     Assessment and Plan [name] 63 year old Caucasian female seen today for suture removal and URI symptoms Assessment & Plan Lacerations of left arm and left hand Sutures removed from left  hand lacerations. No numbness or tingling. Procedure tolerated well.  Acute gastroenteritis with persistent nausea Diarrhea resolved with Pepto-Bismol. Persistent nausea. Negative flu test. COVID-19 test not performed. - Prescribe Zofran  for nausea every four hours as needed. - Advise at-home COVID-19 test and report results. - If fever develops, take Tylenol  or ibuprofen, ensure hydration, and rest. - 1 dose of Zofran  administered at the clinic  Chronic obstructive pulmonary disease (COPD) Mild emphysema/COPD. Smokes ten cigarettes per week.  Screening mammogram for breast cancer Due for screening mammogram. Previous abnormal resolved, attributed to scar tissue. - Schedule screening  mammogram with mobile unit.    Suture Removal  Date/Time: 10/14/2024 9:59 AM  Performed by: [name], NP Authorized by: [name], NP  Body area: upper extremity Location details: left hand Wound Appearance: clean Sutures Removed: 8 Post-removal: antibiotic ointment applied Sutures placed in this facility: sutires placed during her ED visit. Patient tolerance: patient tolerated the procedure well with no immediate complications      Continue all other maintenance medications.  Follow up plan: Return if symptoms worsen or fail to improve.   Continue healthy lifestyle choices, including diet (rich in fruits, vegetables, and lean proteins, and low in salt and simple carbohydrates) and exercise (at least 30 minutes of moderate physical activity daily).  Educational handout given for    Clinical References  Diarrhea, Adult Diarrhea is when you pass loose and sometimes watery poop (stool) often. Diarrhea can make you feel weak and cause you to lose water in your body (get dehydrated). Losing water in your body can cause you to: Feel tired and thirsty. Have a dry mouth. Go pee (urinate) less often. Diarrhea often lasts 2-3 days. It can last longer if it is a sign of something more serious. Be sure to treat your diarrhea as told by your doctor. Follow these instructions at home: Eating and drinking     Follow these instructions as told by your doctor: Take an ORS (oral rehydration solution). This is a drink that helps you replace fluids and minerals your body lost. It is sold at pharmacies and stores. Drink enough fluid to keep your pee (urine) pale yellow. Drink fluids such as: Water. You can also get fluids by sucking on ice chips. Diluted fruit juice. Low-calorie sports drinks. Milk. Avoid drinking fluids that have a lot of sugar or caffeine in them. These include soda, energy drinks, and regular sports drinks. Avoid alcohol. Eat bland,  easy-to-digest foods in small amounts as you are able. These foods include: Bananas. Applesauce. Rice. Low-fat (lean) meats. Toast. Crackers. Avoid spicy or fatty foods.   Medicines Take over-the-counter and prescription medicines only as told by your doctor. If you were prescribed antibiotics, take them as told by your doctor. Do not stop taking them even if you start to feel better. General instructions  Wash your hands often using soap and water for 20 seconds. If soap and water are not available,  use hand sanitizer. Others in your home should wash their hands as well. Wash your hands: After using the toilet or changing a diaper. Before preparing, cooking, or serving food. While caring for a sick person. While visiting someone in a hospital. Rest at home while you get better. Take a warm bath to help with any burning or pain from having diarrhea. Watch your condition for any changes. Contact a doctor if: You have a fever. Your diarrhea gets worse. You have new symptoms. You vomit every time you eat or drink. You feel light-headed, dizzy, or you have a headache. You have muscle cramps. You have signs of losing too much water in your body, such as: Dark pee, very little pee, or no pee. Cracked lips. Dry mouth. Sunken eyes. Sleepiness. Weakness. You have bloody or black poop or poop that looks like tar. You have very bad pain, cramping, or bloating in your belly (abdomen). Your skin feels cold and clammy. You feel confused. Get help right away if: You have chest pain. Your heart is beating very quickly. You have trouble breathing or you are breathing very quickly. You feel very weak or you faint. These symptoms may be an emergency. Get help right away. Call 911. Do not wait to see if the symptoms will go away. Do not drive yourself to the hospital. This information is not intended to replace advice given to you by your health care provider. Make sure you discuss any  questions you have with your health care provider. Document Revised: 05/17/2022 Document Reviewed: 05/17/2022 Elsevier Patient Education  2024 Elsevier Inc. Food Choices to Help Relieve Diarrhea, Adult Diarrhea can make you feel weak and cause you to become dehydrated. Dehydration is a condition in which there is not enough water or other fluids in the body. It is important to choose the right foods and drinks to: Relieve diarrhea. Replace lost fluids and nutrients. Prevent dehydration. What are tips for following this plan? Relieving diarrhea Avoid foods that make your diarrhea worse. These may include: Foods and drinks that are sweetened with high-fructose corn syrup, honey, or sweeteners such as xylitol, sorbitol, and mannitol. Check food labels for these ingredients. Fried, greasy, or spicy foods. Raw fruits and vegetables. Eat foods that are rich in probiotics. These include foods such as yogurt and fermented milk products. Probiotics can help increase healthy bacteria in your stomach and intestines (gastrointestinal or GI tract). This may help digestion and stop diarrhea. If you have lactose intolerance, avoid dairy products. These may make your diarrhea worse. Take medicine to help stop diarrhea only as told by your health care provider. Replacing nutrients  Eat bland, easy-to-digest foods in small amounts as you are able, until your diarrhea starts to get better. These foods include bananas, applesauce, rice, toast, and crackers. Over time, add nutrient-rich foods as your body tolerates them or as told by your health care provider. These include: Well-cooked protein foods, such as eggs, lean meats like fish or chicken without skin, and tofu. Peeled, seeded, and soft-cooked fruits and vegetables. Low-fat dairy products. Whole grains. Take vitamin and mineral supplements as told by your health care provider. Preventing dehydration  Start by sipping water or a solution to prevent  dehydration (oral rehydration solution, or ORS). This is a drink that helps replace fluids and minerals your body has lost. You can buy an ORS at pharmacies and retail stores. Try to drink at least 8-10 cups (2,000-2,500 mL) of fluid each day to help replace lost fluids.  If your urine is pale yellow, you are getting enough fluids. You may drink other liquids in addition to water, such as fruit juice that you have added water to (diluted fruit juice) or low-calorie sports drinks, as tolerated or as told by your health care provider. Avoid drinks with caffeine, such as coffee, tea, or soft drinks. Avoid alcohol. This information is not intended to replace advice given to you by your health care provider. Make sure you discuss any questions you have with your health care provider. Document Revised: 05/17/2022 Document Reviewed: 05/17/2022 Elsevier Patient Education  2024 Elsevier Inc. Suture Removal, Care After The following information offers guidance on how to care for yourself after your procedure. Your health care provider may also give you more specific instructions. If you have problems or questions, contact your health care provider. What can I expect after the procedure? After your stitches (sutures) are removed, it is common to have: Some discomfort and swelling in the area. Slight redness in the area. Follow these instructions at home: If you have a dressing: Wash your hands with soap and water for at least 20 seconds before and after you change your bandage (dressing). If soap and water are not available, use hand sanitizer. Change your dressing as told by your health care provider. If your dressing becomes wet or dirty, or develops a bad smell, change it as soon as possible. If your dressing sticks to your skin, pour warm, clean water over it until it loosens and can be removed without pulling apart the wound edges. Pat the area dry with a soft, clean towel. Do not rub the wound because  that may cause bleeding. Wound care  Check your wound every day for signs of infection. Check for: More redness, swelling, or pain. Fluid or blood. New warmth, a rash, or hardness at the wound site. Pus or a bad smell. Wash your hands with soap and water for at least 20 seconds before and after touching your wound. If soap and water are not available, use hand sanitizer. Keep the wound area dry and clean. Clean and pat the wound dry as told by your health care provider. Apply cream or ointment only as told by your health care provider. If skin glue or adhesive strips were applied after sutures were removed, leave these closures in place. They may need to stay in place for 2 weeks or longer. If adhesive strip edges start to loosen and curl up, you may trim the loose edges. Do not remove adhesive strips completely unless your health care provider tells you to do that. Continue to protect the wound from injury. Do not pick at your wound. Picking can cause an infection. Bathing Do not take baths, swim, or use a hot tub until your health care provider approves. Ask your health care provider if you may take showers. Follow these steps for showering: If you have a dressing, remove it before getting into the shower. In the shower, allow soapy water to get on the wound. Avoid scrubbing the wound. When you get out of the shower, dry the wound by patting it with a clean towel. Reapply a dressing over the wound, if needed. Scar care When your wound has completely healed, help decrease the size of your scar by: Wearing sunscreen over the scar or covering it with clothing when you are outside. New scars get sunburned easily, which can make scarring worse. Gently massaging the scarred area. This can decrease scar thickness. General instructions Take over-the-counter  and prescription medicines only as told by your health care provider. Keep all follow-up visits. This is important. Contact a health care  provider if: You have more redness, swelling, or pain around your wound. You have fluid or blood coming from your wound. You have new warmth, a rash, or hardness at the wound site. You have pus or a bad smell coming from your wound. Your wound opens up. Get help right away if: You have a fever or chills. You have red streaks coming from your wound. Summary After your sutures are removed, it is common to have some discomfort and swelling in the area. Wash your hands with soap and water before you change your bandage (dressing). Keep the wound area dry and clean. Do not take baths, swim, or use a hot tub until your health care provider approves. This information is not intended to replace advice given to you by your health care provider. Make sure you discuss any questions you have with your health care provider. Document Revised: 03/23/2021 Document Reviewed: 03/23/2021 Elsevier Patient Education  2024 Elsevier Inc. Nausea, Adult Nausea is feeling like you may vomit. Feeling like you may vomit is usually not serious, but it may be an early sign of a more serious medical problem. Vomiting is when stomach contents forcefully come out of your mouth. If you vomit, or if you are not able to drink enough fluids, you may not have enough water in your body (get dehydrated). If you do not have enough water in your body, you may: Feel tired. Feel thirsty. Have a dry mouth. Have cracked lips. Pee (urinate) less often. Older adults and people who have other diseases or a weak body defense system (immune system) have a higher risk of not having enough water in the body. The main goals of treating this condition are: To relieve your nausea. To ensure your nausea occurs less often. To prevent vomiting and losing too much fluid. Follow these instructions at home: Watch your symptoms for any changes. Tell your doctor about them. Eating and drinking     Take an ORS (oral rehydration solution). This  is a drink that is sold at pharmacies and stores. Drink clear fluids in small amounts as you are able. These include: Water. Ice chips. Fruit juice that has water added (diluted fruit juice). Low-calorie sports drinks. Eat bland, easy-to-digest foods in small amounts as you are able, such as: Bananas. Applesauce. Rice. Low-fat (lean) meats. Toast. Crackers. Avoid drinking fluids that have a lot of sugar or caffeine in them. This includes energy drinks, sports drinks, and soda. Avoid alcohol. Avoid spicy or fatty foods. General instructions Take over-the-counter and prescription medicines only as told by your doctor. Rest at home while you get better. Drink enough fluid to keep your pee (urine) pale yellow. Take slow and deep breaths when you feel like you may vomit. Avoid food or things that have strong smells. Wash your hands often with soap and water for at least 20 seconds. If you cannot use soap and water, use hand sanitizer. Make sure that everyone in your home washes their hands well and often. Keep all follow-up visits. Contact a doctor if: You feel worse. You feel like you may vomit and this lasts for more than 2 days. You vomit. You are not able to drink fluids without vomiting. You have new symptoms. You have a fever. You have a headache. You have muscle cramps. You have a rash. You have pain while peeing. You feel  light-headed or dizzy. Get help right away if: You have pain in your chest, neck, arm, or jaw. You feel very weak or you faint. You have vomit that is bright red or looks like coffee grounds. You have bloody or black poop (stools) or poop that looks like tar. You have a very bad headache, a stiff neck, or both. You have very bad pain, cramping, or bloating in your belly (abdomen). You have trouble breathing or you are breathing very quickly. Your heart is beating very quickly. Your skin feels cold and clammy. You feel confused. You have signs of  losing too much water in your body, such as: Dark pee, very little pee, or no pee. Cracked lips. Dry mouth. Sunken eyes. Sleepiness. Weakness. These symptoms may be an emergency. Get help right away. Call 911. Do not wait to see if the symptoms will go away. Do not drive yourself to the hospital. Summary Nausea is feeling like you are about vomit. If you vomit, or if you are not able to drink enough fluids, you may not have enough water in your body (get dehydrated). Eat and drink what your doctor tells you. Take over-the-counter and prescription medicines only as told by your doctor. Contact a doctor right away if your symptoms get worse or you have new symptoms. Keep all follow-up visits. This information is not intended to replace advice given to you by your health care provider. Make sure you discuss any questions you have with your health care provider. Document Revised: 06/04/2021 Document Reviewed: 06/04/2021 Elsevier Patient Education  2024 Elsevier Inc. Nausea and Vomiting, Adult Nausea is feeling that you have an upset stomach and that you are about to vomit. Vomiting is when food in your stomach forcefully comes out of your mouth. Vomiting can make you feel weak. If you vomit, or if you are not able to drink enough fluids, you may not have enough water in your body (get dehydrated). If you do not have enough water in your body, you may: Feel tired. Feel thirsty. Have a dry mouth. Have cracked lips. Pee (urinate) less often. Older adults and people with other diseases or a weak body defense system (immune system) are at higher risk for not having enough water in the body. If you feel like you may vomit or you vomit, it is important to follow instructions from your doctor about how to take care of yourself. Follow these instructions at home: Watch your symptoms for any changes. Tell your doctor about them. Eating and drinking     Take an ORS (oral rehydration solution).  This is a drink that is sold at pharmacies and stores. Drink clear fluids in small amounts as you are able, such as: Water. Ice chips. Fruit juice that has water added (diluted fruit juice). Low-calorie sports drinks. Eat bland, easy-to-digest foods in small amounts as you are able, such as: Bananas. Applesauce. Rice. Low-fat (lean) meats. Toast. Crackers. Avoid drinking fluids that have a lot of sugar or caffeine in them. This includes energy drinks, sports drinks, and soda. Avoid alcohol. Avoid spicy or fatty foods. General instructions Take over-the-counter and prescription medicines only as told by your doctor. Drink enough fluid to keep your pee (urine) pale yellow. Wash your hands often with soap and water for at least 20 seconds. If you cannot use soap and water, use hand sanitizer. Make sure that everyone in your home washes their hands well and often. Rest at home until you feel better. Watch your condition  for any changes. Take slow and deep breaths when you feel like you may vomit. Keep all follow-up visits. Contact a doctor if: Your symptoms get worse. You have new symptoms. You have a fever. You cannot drink fluids without vomiting. You feel like you may vomit for more than 2 days. You feel light-headed or dizzy. You have a headache. You have muscle cramps. You have a rash. You have pain while peeing. Get help right away if: You have pain in your chest, neck, arm, or jaw. You feel very weak or you faint. You vomit again and again. You have vomit that is bright red or looks like black coffee grounds. You have bloody or black poop (stools) or poop that looks like tar. You have a very bad headache, a stiff neck, or both. You have very bad pain, cramping, or bloating in your belly (abdomen). You have trouble breathing. You are breathing very quickly. Your heart is beating very quickly. Your skin feels cold and clammy. You feel confused. You have signs of  losing too much water in your body, such as: Dark pee, very little pee, or no pee. Cracked lips. Dry mouth. Sunken eyes. Sleepiness. Weakness. These symptoms may be an emergency. Get help right away. Call 911. Do not wait to see if the symptoms will go away. Do not drive yourself to the hospital. Summary Nausea is feeling that you have an upset stomach and that you are about to vomit. Vomiting is when food in your stomach comes out of your mouth. Follow instructions from your doctor about eating and drinking. Take over-the-counter and prescription medicines only as told by your doctor. Contact your doctor if your symptoms get worse or you have new symptoms. Keep all follow-up visits. This information is not intended to replace advice given to you by your health care provider. Make sure you discuss any questions you have with your health care provider. Document Revised: 06/04/2021 Document Reviewed: 06/04/2021 Elsevier Patient Education  2024 Elsevier Inc. Cancer Screening: Female A cancer screening is a test or exam that checks for cancer. Work with your health care provider to create a cancer screening schedule that protects your health. Who should have screening? All females should be considered for screening of certain cancers, including breast cancer, cervical cancer, colorectal cancer, endometrial cancer, lung cancer, and skin cancer. Your health care provider may recommend screenings for other types of cancer if: You have had cancer before. You have a family member with cancer. You have genes that could increase the risk of cancer. You have risk factors for certain cancers, such as current or past use of tobacco products or being overweight. What are the benefits of screening? Cancer screening is done to look for cancer in the very early stages, before it spreads and becomes harder to treat and before you would start to notice symptoms. Finding cancer early improves the chances of  successful treatment. It may save your life. When should I be screened for cancer? When you should be screened for cancer depends on: Your age. Your medical history and your family's medical history. Certain lifestyle factors, such as smoking or other use of tobacco products. Environmental exposure, such as to asbestos. How is screening done? Breast cancer Breast cancer screening is done with a test that takes images of breast tissue (mammogram) using an X-ray machine. Here are some screening guidelines for females at average risk: When you are 69-61 years old, you should be given the choice to start having mammograms.  When you are 44-23 years old, you should have a mammogram every year. You may start having mammograms before you are 61 years old if you have risk factors for breast cancer, such as having an immediate family member with breast cancer. At 37 years old or older, you should have a mammogram every 1-2 years for as long as you are in good health and have a life expectancy of 10 years or longer. It is important to know what your breasts look and feel like so you can report any changes to your health care provider.   Cervical cancer Cervical cancer screening is done with an HPV (human papillomavirus) test to identify the virus that causes cervical cancer. To perform the test, a health care provider takes a swab of cells from the lowest part of the uterus (cervix) during a pelvic exam. This test may be performed along with a Pap test. This testchecks for abnormalities in the cervix. All females at average risk should consider being screened for cervical cancer starting no later than 61 years old and continuing until 61 years old. Screening should not begin earlier than 61 years old. You will have tests every 3-5 years, depending on your results and the type of screening test. Talk with your health care provider about which screening test is right for you and how often you should be  screened. If you have had the HPV vaccine, you will still be screened for cervical cancer and follow normal screening recommendations. You do not need to be screened for cervical cancer if any of the following apply to you: You are older than 61 years old and you have had normal screening tests in the past 10 years with no serious cervical precancer or cancer in the last 25 years. Your cervix and uterus have been removed, and you have never had cervical cancer or abnormal cells that could become cancer (precancerous cells). Colorectal cancer Colorectal cancer screening looks for cancer or for growths called polyps that often form before cancer starts. Tests to look for cancer or polyps include: Colonoscopy or flexible sigmoidoscopy. For these procedures, a flexible tube with a small camera is inserted into the rectum. CT colonography. This test uses X-rays and contrast dye to check the colon for polyps. Tests to look for cancer in the stool (feces) include: Guaiac-based fecal occult blood test (FOBT). This test can find blood in stool. It can be done at home with a kit. Fecal immunochemical test (FIT). This test can find blood in stool. For this test, you will need to collect stool samples at home. Stool DNA test. This test looks for blood in stool and any changes in DNA that can lead to colon cancer. For this test, you will need to collect a stool sample at home and send it to a lab. All adults should have screenings starting at 61 years old and continuing through 61 years old. For females 39-92 years old, the decision to be screened should be based on a person's preferences, life expectancy, overall health, and prior screening history. Your health care provider may recommend screening before 61 years old. You will have tests every 1-10 years, depending on your results and the type of screening test. People at increased risk should start screening at an earlier age. Talk with your health care provider  about which screening test is right for you and how often you should be screened. Endometrial cancer There is no standard screening test for endometrial cancer, and females at  average risk do not routinely need to have this screening. Talk with your health care provider about whether screening is right for you. If it is, this screening is performed through: Endometrial tissue biopsy. This tests a sample of tissue taken from the lining of the uterus. Vaginal ultrasound. If you are at increased risk for endometrial cancer, you may need to have these tests more often than normal. You are at increased risk if: You have a family history of ovarian, uterine, or certain types of colon cancer. You are taking tamoxifen, a medicine used to treat breast cancer. If you have reached menopause, it is especially important to talk with your health care provider about any vaginal bleeding or spotting. Screening for endometrial cancer is not recommended for females who do not have symptoms of the cancer, such as vaginal bleeding. Lung cancer Lung cancer screening is done with a CT scan that looks for abnormal changes in the lungs. Discuss lung cancer screening with your health care provider if you are 56-46 years old and if any of the following apply to you: You currently smoke. You used to smoke heavily. You have a smoking history of 1 pack of cigarettes a day for 20 years or 2 packs a day for 10 years. You may need to be screened every year if you smoke heavily or if you used to smoke. Skin cancer Skin cancer screening is done by checking the skin for unusual moles or spots and any changes in existing moles. Your health care provider should check your skin for signs of skin cancer at every physical exam. You should check your skin every month and tell your health care provider right away if anything looks unusual. Females with a higher-than-normal risk for skin cancer may want to see a skin specialist  (dermatologist) for an annual body check. Where to find more information American Cancer Society: cancer.org Centers for Disease Control and Prevention: tonerpromos.no National Cancer Institute: cancer.gov U.S. Department of Health and Human Services: travelless[place].ca Contact a health care provider if: You have concerns about any signs or symptoms of cancer. These may include: Skin problems. You may have: Moles of an unusual shape or color. Changes in existing moles. A sore on your skin that does not heal. Tiredness (fatigue) that does not go away. Losing weight without trying. Blood in your urine or stool. Problems with coughing or breathing. These may include: Coughing or trouble breathing that does not go away. Coughing up blood. Lumps or other changes in your breasts. Vaginal bleeding, spotting, or changes in your period. Frequent pain or cramping in your abdomen. This information is not intended to replace advice given to you by your health care provider. Make sure you discuss any questions you have with your health care provider. Document Revised: 12/06/2022 Document Reviewed: 06/20/2022 Elsevier Patient Education  2024 Elsevier Inc.  The above assessment and management plan was discussed with the patient. The patient verbalized understanding of and has agreed to the management plan. Patient is aware to call the clinic if they develop any new symptoms or if symptoms persist or worsen. Patient is aware when to return to the clinic for a follow-up visit. Patient educated on when it is appropriate to go to the emergency department.   Glee Lashomb [place] Thompson, DNP [facility] [address] [place], [place] [address] [phone]

## 2024-10-10 NOTE — Patient Instructions (Signed)
 Our records indicate that you are due for your annual mammogram/breast imaging. While there is no way to prevent breast cancer, early detection provides the best opportunity for curing it. For women over the age of 39, the American Cancer Society recommends a yearly clinical breast exam and a yearly mammogram. These practices have saved thousands of lives. We need your help to ensure that you are receiving optimal medical care. Please call the imaging location that has done you previous mammograms. Please remember to list [place] as your primary care. This helps make sure we receive a report and can update your chart.  Below is the contact information for several local breast imaging centers. You may call the location that works best for you, and they will be happy to assistance in making you an appointment. You do not need an order for a regular screening mammogram. However, if you are having any problems or concerns with you breast area, please let your primary care provider know, and appropriate orders will be placed. Please let our office know if you have any questions or concerns. Or if you need information for another imaging center not on this list or outside of the area. We are commented to working with you on your health care journey.   The mobile unit/bus ([facility]) - they come twice a month to our location.  These appointments can be made through our office or by call [facility]  [facility]  [address] [place], [place] [address] Phone [phone]  [facility]  [address] [place], [place] [address] [phone]  [facility] (part of [facility])  [phone] [address], [place] [address] [phone]  [facility]  [address] [place] [place] [address] [phone]  [facility] - [facility]  [address] [place] [address] [phone]  [facility] Mammography in [place]  [address] [place], [place] [address] [phone]  [facility] [address] [place], [place] [address] [phone]  [facility] [address] [place], [place] [address] [phone]  [facility] [place], [place] [address] [phone]

## 2024-10-10 NOTE — Progress Notes (Signed)
 Remote PPM Transmission

## 2024-10-11 ENCOUNTER — Ambulatory Visit: Admitting: Physical Therapy

## 2024-10-14 ENCOUNTER — Ambulatory Visit (INDEPENDENT_AMBULATORY_CARE_PROVIDER_SITE_OTHER): Admitting: Nurse Practitioner

## 2024-10-14 ENCOUNTER — Ambulatory Visit: Payer: Self-pay | Admitting: Nurse Practitioner

## 2024-10-14 ENCOUNTER — Encounter: Payer: Self-pay | Admitting: Nurse Practitioner

## 2024-10-14 VITALS — BP 133/71 | HR 64 | Temp 97.7°F | Ht 65.0 in | Wt 205.6 lb

## 2024-10-14 DIAGNOSIS — E669 Obesity, unspecified: Secondary | ICD-10-CM | POA: Insufficient documentation

## 2024-10-14 DIAGNOSIS — Z4802 Encounter for removal of sutures: Secondary | ICD-10-CM | POA: Insufficient documentation

## 2024-10-14 DIAGNOSIS — R6889 Other general symptoms and signs: Secondary | ICD-10-CM | POA: Diagnosis not present

## 2024-10-14 DIAGNOSIS — K529 Noninfective gastroenteritis and colitis, unspecified: Secondary | ICD-10-CM | POA: Insufficient documentation

## 2024-10-14 DIAGNOSIS — R112 Nausea with vomiting, unspecified: Secondary | ICD-10-CM | POA: Insufficient documentation

## 2024-10-14 DIAGNOSIS — R7689 Other specified abnormal immunological findings in serum: Secondary | ICD-10-CM | POA: Insufficient documentation

## 2024-10-14 DIAGNOSIS — Z1231 Encounter for screening mammogram for malignant neoplasm of breast: Secondary | ICD-10-CM | POA: Insufficient documentation

## 2024-10-14 LAB — VERITOR FLU A/B WAIVED
Influenza A: NEGATIVE
Influenza B: NEGATIVE

## 2024-10-14 MED ORDER — ONDANSETRON 4 MG PO TBDP
4.0000 mg | ORAL_TABLET | Freq: Once | ORAL | Status: AC
  Administered 2024-10-14: 4 mg via ORAL

## 2024-10-14 MED ORDER — ONDANSETRON 4 MG PO TBDP: 4.0000 mg | ORAL_TABLET | Freq: Three times a day (TID) | ORAL | 0 refills | Status: DC | PRN

## 2024-10-16 ENCOUNTER — Ambulatory Visit: Attending: Orthopedic Surgery

## 2024-10-16 DIAGNOSIS — R600 Localized edema: Secondary | ICD-10-CM | POA: Insufficient documentation

## 2024-10-16 DIAGNOSIS — G8929 Other chronic pain: Secondary | ICD-10-CM | POA: Insufficient documentation

## 2024-10-16 DIAGNOSIS — M25662 Stiffness of left knee, not elsewhere classified: Secondary | ICD-10-CM | POA: Diagnosis present

## 2024-10-16 DIAGNOSIS — M25562 Pain in left knee: Secondary | ICD-10-CM | POA: Insufficient documentation

## 2024-10-16 NOTE — Therapy (Signed)
 OUTPATIENT PHYSICAL THERAPY LOWER EXTREMITY TREATMENT  Patient Name: [name] MRN: [id number] DOB:1961/07/23, 63 y.o., female Today's Date: 10/16/2024  END OF SESSION:  PT End of Session - 10/16/24 0805     Visit Number 9    Number of Visits 12    Date for Recertification  10/25/24    PT Start Time 0800    Activity Tolerance Patient tolerated treatment well    Behavior During Therapy [facility] for tasks assessed/performed           Past Medical History:  Diagnosis Date   Allergy    Anxiety    Arthritis    Asthma    Cataract    Depression    bipolar disorder   Emphysema, unspecified (HCC)    Esophageal stricture    GERD (gastroesophageal reflux disease)    Heart defect    HLD (hyperlipidemia)    Hypothyroidism    IBS (irritable bowel syndrome)    IFG (impaired fasting glucose)    Migraine headache    Obesity    OSA (obstructive sleep apnea)    Pacemaker    Prediabetes    Reflux    Sleep apnea    SOB (shortness of breath)    Symptomatic bradycardia    Past Surgical History:  Procedure Laterality Date   ABDOMINAL HERNIA REPAIR     BREAST CYST EXCISION     left   CATARACT EXTRACTION Bilateral    CESAREAN SECTION  1983. 1987   Cesarean sections     x2   CHOLECYSTECTOMY     COLONOSCOPY     dental implants     foor surgery Right    screws    FOOT SURGERY     screws in left foot   HERNIA REPAIR  2005   PACEMAKER INSERTION     PPM GENERATOR CHANGEOUT N/A 10/07/2022   Procedure: PPM GENERATOR CHANGEOUT;  Surgeon: Waddell Danelle ORN, MD;  Location: [facility] IN[facility];  Service: Cardiovascular;  Laterality: N/A;   REPLACEMENT TOTAL KNEE Left    TUBAL LIGATION     VAGINAL HYSTERECTOMY     Patient Active Problem List   Diagnosis Date Noted   Nausea and vomiting 10/14/2024   Encounter for removal of sutures 10/14/2024   Screening mammogram for breast cancer 10/14/2024   Flu-like symptoms 10/14/2024   Elevated antinuclear antibody (ANA) level 10/14/2024    Obesity (BMI 30-39.9) 10/14/2024   Acute gastroenteritis 10/14/2024   Primary osteoarthritis of left knee 07/16/2024   Moderate episode of recurrent major depressive disorder (HCC) 07/10/2024   Psychophysiological insomnia 07/10/2024   Astigmatism of both eyes with presbyopia 11/24/2023   Myopia of both eyes with astigmatism and presbyopia 06/01/2023   Nuclear sclerotic cataract of both eyes 06/01/2023   Atrial fibrillation (HCC) 01/10/2023   Prediabetes 08/29/2022   Migraine headache 08/29/2022   IFG (impaired fasting glucose) 08/29/2022   IBS (irritable bowel syndrome) 08/29/2022   HLD (hyperlipidemia) 08/29/2022   Heart defect 08/29/2022   Emphysema, unspecified (HCC) 08/29/2022   Asthma 08/29/2022   Arthritis 08/29/2022   Allergy 08/29/2022   Meralgia paresthetica 02/10/2021   Vitamin D  deficiency 10/26/2020   Morbid obesity (HCC) 07/01/2017   Anxiety and depression 04/13/2016   Hypothyroidism 07/04/2014   GERD (gastroesophageal reflux disease) 10/26/2011   PPM-[facility] 02/14/2011   AV BLOCK, COMPLETE 10/28/2010   Right bundle branch block with left anterior fascicular block 10/28/2010   TOBACCO ABUSE 09/14/2010   OBSTRUCTIVE SLEEP APNEA 09/14/2010  REFERRING PROVIDER: Lamar BIRCH. Henderson MD  REFERRING DIAG: Primary OA of left knee s/p total knee replacement  THERAPY DIAG:  Chronic pain of left knee  Stiffness of left knee, not elsewhere classified  Localized edema  Rationale for Evaluation and Treatment: Rehabilitation  ONSET DATE: 08/13/24 (surgery date).    SUBJECTIVE:   SUBJECTIVE STATEMENT: Pt denies any knee pain, reports having nausea and diarrhea for the past 5 days.  No fever.  All tests negative.  PERTINENT HISTORY: Pacemaker.  See above.   PAIN:  Are you having pain? No  PRECAUTIONS: ICD/Pacemaker.  No ultrasound, no electrical stimulation.    RED FLAGS: None   WEIGHT BEARING RESTRICTIONS: No  FALLS:  Has patient fallen in last 6 months?  No  LIVING ENVIRONMENT: Lives with: lives with their spouse Lives in: House/apartment Has following equipment at home: Single point cane  PLOF: Independent  PATIENT GOALS: Do more without pain.   OBJECTIVE:   PATIENT SURVEYS:  LEFS:  27/80.  EDEMA:  Circumferential: Left 4 cms > right.  PALPATION: Minimal anterior left knee tenderness.  Incisional site looks to be healing well.   LOWER EXTREMITY ROM:  -7 degrees to 100 degrees.    LOWER EXTREMITY MMT:  Patient easily able to perform an antigravity left SLR and SAQ.   GAIT: Patient was walking safely with a cane with a decrease in step and stride length.                                                                                                                                 TREATMENT DATE:  10/16/24  EXERCISE LOG  Exercise Repetitions and Resistance Comments  Nustep Lvl 3 x 15 mins; seat 6-5   Cybex Knee Flexion 40# x 3 mins   Cybex Knee Extension 10# x 4 mins   Leg Press    Rockerboard 4 mins   Lunges 14 box x 4 mins   Step Downs    LAQs    Seated Marches    Seated Hip Abduction    Seated Hip Adduction    Seated Ham Curls    STSs     Blank cell = exercise not performed today                                     EXERCISE LOG  Exercise Repetitions and Resistance Comments  Nustep 15 minutes beginning at seat 7 and finishing at seat 5.   Rockerboard 5 minutes   LAQ's 3# x 5 minutes.   SAQ's 5 minutes.       STW/M x 4 minutes along left ITB and PROM x 4 minutes LLLDS f/b LE elevation and vasopneumatic on low x 10 minutes.     09/25/24  EXERCISE LOG  Exercise Repetitions and Resistance Comments  Nustep Lvl 3 x 15 mins; seat 8-7   Rockerboard 5 mins   Lunges 14 box x 3 mins   Forward Step Ups 6 box x 20 reps   LAQs 2# x 25 reps    Seated Marches 2# x 25 reps   Seated Hip Abduction Red x 3.5 mins   Seated Hip Adduction 3.5 mins   Seated Ham Curls Red x 25 reps    STSs     Blank cell = exercise not performed today   PATIENT EDUCATION:  Education details:  Person educated:  International aid/development worker:  Education comprehension:   HOME EXERCISE PROGRAM:   ASSESSMENT:  CLINICAL IMPRESSION: Pt arrives for today's treatment session denying any pain.  Pt reports that she has had nausea and diarrhea for 5 days.  Pt went to MD this morning and is negative for flu and covid, no fever.  Pt able to tolerate increased weight with knee extensions today, but fatigue is noted due to pt's recent illness.  Pt requested session end early today due to fatigue and note feeling well.  Pt will need progress note at next visit.  Pt denied any pain at completion of today's treatment session.   OBJECTIVE IMPAIRMENTS: Abnormal gait, decreased activity tolerance, decreased ROM, increased edema, and pain.   ACTIVITY LIMITATIONS: carrying, lifting, bending, and locomotion level  PARTICIPATION LIMITATIONS: meal prep, cleaning, laundry, community activity, and yard work  PERSONAL FACTORS: Time since onset of injury/illness/exacerbation are also affecting patient's functional outcome.   REHAB POTENTIAL: Excellent  CLINICAL DECISION MAKING: Stable/uncomplicated  EVALUATION COMPLEXITY: Low   GOALS:  SHORT TERM GOALS: Target date: 09/27/24  Ind with an initial HEP. Goal status: MET   LONG TERM GOALS: Target date: 12/12/24  Ind with an advanced HEP.  Goal status: IN PROGRESS  2.  Active knee flexion to 120 degrees+ so the patient can perform functional tasks and do so with pain not > 2-3/10. 10/22: 109 degrees   Goal status: IN PROGRESS  3.  Perform a reciprocating stair gait with one railing with pain not > 2-3/10.  Goal status: MET  4.  Full active knee extension in order to normalize gait.  Goal status: MET  5.  Perform ADL's with pain not > 3/10.   10/22: 3-4/10  Goal status: IN PROGRESS  6.  Improve LEFS score by at least 10 points.   10/22: 55/80  Goal  status: MET   PLAN:  PT FREQUENCY: 2x/week  PT DURATION: 6 weeks  PLANNED INTERVENTIONS: 97110-Therapeutic exercises, 97530- Therapeutic activity, 97112- Neuromuscular re-education, 97535- Self Care, [id number]- Manual therapy, [id number]- Vasopneumatic device, Patient/Family education, and Cryotherapy  PLAN FOR NEXT SESSION: Nustep, progress to recumbent bike.  ROM, strengthening.     Delon DELENA Gosling, PTA 10/16/2024, 8:33 AM

## 2024-10-17 ENCOUNTER — Ambulatory Visit: Payer: Self-pay

## 2024-10-17 ENCOUNTER — Ambulatory Visit (INDEPENDENT_AMBULATORY_CARE_PROVIDER_SITE_OTHER): Admitting: Nurse Practitioner

## 2024-10-17 ENCOUNTER — Encounter: Payer: Self-pay | Admitting: Nurse Practitioner

## 2024-10-17 VITALS — BP 117/73 | HR 67 | Temp 98.9°F | Ht 65.0 in | Wt 203.4 lb

## 2024-10-17 DIAGNOSIS — R112 Nausea with vomiting, unspecified: Secondary | ICD-10-CM

## 2024-10-17 DIAGNOSIS — N951 Menopausal and female climacteric states: Secondary | ICD-10-CM

## 2024-10-17 DIAGNOSIS — K2900 Acute gastritis without bleeding: Secondary | ICD-10-CM | POA: Diagnosis not present

## 2024-10-17 DIAGNOSIS — Z1211 Encounter for screening for malignant neoplasm of colon: Secondary | ICD-10-CM

## 2024-10-17 MED ORDER — DOXEPIN HCL 10 MG PO CAPS: 10.0000 mg | ORAL_CAPSULE | Freq: Every evening | ORAL | 0 refills | Status: DC | PRN

## 2024-10-17 MED ORDER — DICYCLOMINE HCL 10 MG PO CAPS: 10.0000 mg | ORAL_CAPSULE | Freq: Three times a day (TID) | ORAL | 0 refills | Status: DC

## 2024-10-17 NOTE — Progress Notes (Signed)
 Subjective:  Patient ID: [name], female    DOB: March 28, 1961, 63 y.o.   MRN: [id number]  Patient Care Team: Deitra Morton Hummer, Nena, NP as PCP - General (Nurse Practitioner) Waddell Danelle ORN, MD as PCP - Electrophysiology (Cardiology)   Chief Complaint:  No chief complaint on file.   HPI: [name] is a 63 y.o. female presenting on 10/17/2024 for No chief complaint on file.   Discussed the use of AI scribe software for clinical note transcription with the patient, who gave verbal consent to proceed.  History of Present Illness [name] is a 63 year old female who presents with nausea and sleep disturbances.  She experiences persistent nausea despite taking Zofran , which has not provided relief. She has also been using Pepto and Tylenol . Her appetite is reduced due to the nausea.  Her diarrhea has resolved, but she now experiences constipation, having not had a bowel movement since the previous day. She consumes mostly water and some [employer].  She experiences significant sleep disturbances, struggling to fall asleep and stay asleep, often lying awake for hours at night. She attributes her sleeplessness to feeling unwell and experiencing sweating. She has tried trazodone  and melatonin in the past without success.  She experiences hot flashes and sweating, which contribute to her discomfort and sleep issues.  She recalls having a colonoscopy due in June, which she has not yet completed. She has a history of polyps and expresses concern about potential issues with her colon.  In her social history, she is retired and actively involved in caring for her grandchildren, including a nearly two-year-old and a nine-year-old, which she finds demanding.      Relevant past medical, surgical, family, and social history reviewed and updated as indicated.  Allergies and medications reviewed and updated. Data reviewed: Chart in Epic.   Past Medical History:  Diagnosis  Date   Allergy    Anxiety    Arthritis    Asthma    Cataract    Depression    bipolar disorder   Emphysema, unspecified (HCC)    Esophageal stricture    GERD (gastroesophageal reflux disease)    Heart defect    HLD (hyperlipidemia)    Hypothyroidism    IBS (irritable bowel syndrome)    IFG (impaired fasting glucose)    Migraine headache    Obesity    OSA (obstructive sleep apnea)    Pacemaker    Prediabetes    Reflux    Sleep apnea    SOB (shortness of breath)    Symptomatic bradycardia     Past Surgical History:  Procedure Laterality Date   ABDOMINAL HERNIA REPAIR     BREAST CYST EXCISION     left   CATARACT EXTRACTION Bilateral    CESAREAN SECTION  1983. 1987   Cesarean sections     x2   CHOLECYSTECTOMY     COLONOSCOPY     dental implants     foor surgery Right    screws    FOOT SURGERY     screws in left foot   HERNIA REPAIR  2005   PACEMAKER INSERTION     PPM GENERATOR CHANGEOUT N/A 10/07/2022   Procedure: PPM GENERATOR CHANGEOUT;  Surgeon: Waddell Danelle ORN, MD;  Location: [facility];  Service: Cardiovascular;  Laterality: N/A;   REPLACEMENT TOTAL KNEE Left    TUBAL LIGATION     VAGINAL HYSTERECTOMY      Social  History   Socioeconomic History   Marital status: Married    Spouse name: Lynwood   Number of children: 2   Years of education: Not on file   Highest education level: GED or equivalent  Occupational History   Occupation: retired    Associate Professor: [employer]   Occupation: Nana, Housewife  Tobacco Use   Smoking status: Former    Current packs/day: 0.00    Types: Cigarettes    Quit date: 07/08/2022    Years since quitting: 2.2   Smokeless tobacco: Never  Vaping Use   Vaping status: Former  Substance and Sexual Activity   Alcohol use: No    Alcohol/week: 0.0 standard drinks of alcohol    Comment: Occ.    Drug use: No   Sexual activity: Not on file  Other Topics Concern   Not on file  Social History Narrative   Unemployed. 10 cans  of soda per day.    Social Drivers of Corporate Investment Banker Strain: Low Risk  (10/10/2024)   Overall Financial Resource Strain (CARDIA)    Difficulty of Paying Living Expenses: Not very hard  Food Insecurity: No Food Insecurity (10/10/2024)   Hunger Vital Sign    Worried About Running Out of Food in the Last Year: Never true    Ran Out of Food in the Last Year: Never true  Transportation Needs: No Transportation Needs (10/10/2024)   PRAPARE - Administrator, Civil Service (Medical): No    Lack of Transportation (Non-Medical): No  Physical Activity: Insufficiently Active (10/10/2024)   Exercise Vital Sign    Days of Exercise per Week: 1 day    Minutes of Exercise per Session: 10 min  Stress: Stress Concern Present (10/10/2024)   [employer] of Occupational Health - Occupational Stress Questionnaire    Feeling of Stress: Rather much  Social Connections: Moderately Isolated (10/10/2024)   Social Connection and Isolation Panel    Frequency of Communication with Friends and Family: Three times a week    Frequency of Social Gatherings with Friends and Family: Patient declined    Attends Religious Services: Patient declined    Active Member of Clubs or Organizations: No    Attends Engineer, Structural: Not on file    Marital Status: Married  Catering Manager Violence: Not At Risk (08/13/2024)   Received from [facility]   HITS    Over the last 12 months how often did your partner physically hurt you?: Never    Over the last 12 months how often did your partner insult you or talk down to you?: Never    Over the last 12 months how often did your partner threaten you with physical harm?: Never    Over the last 12 months how often did your partner scream or curse at you?: Never    Outpatient Encounter Medications as of 10/17/2024  Medication Sig   albuterol  (VENTOLIN  HFA) 108 (90 Base) MCG/ACT inhaler INHALE 2 PUFFS BY MOUTH EVERY 4 HOURS AS NEEDED FOR  WHEEZING OR SHORTNESS OF BREATH   benzonatate  (TESSALON  PERLES) 100 MG capsule Take 1 capsule (100 mg total) by mouth 3 (three) times daily as needed. (Patient not taking: Reported on 10/14/2024)   budeson-glycopyrrolate-formoterol (BREZTRI  AEROSPHERE) 160-9-4.8 MCG/ACT AERO inhaler Inhale 2 puffs into the lungs 2 (two) times daily.   escitalopram  (LEXAPRO ) 10 MG tablet Take 1 tablet by mouth once daily   levothyroxine  (SYNTHROID ) 100 MCG tablet Take 100 mcg by mouth daily.  omega-3 acid ethyl esters (LOVAZA) 1 g capsule Take by mouth.   omeprazole  (PRILOSEC) 20 MG capsule Take 1 capsule (20 mg total) by mouth 2 (two) times daily. (Patient taking differently: Take 20 mg by mouth 2 (two) times daily as needed (Heartburn).)   ondansetron  (ZOFRAN -ODT) 4 MG disintegrating tablet Take 1 tablet (4 mg total) by mouth every 8 (eight) hours as needed for nausea or vomiting.   prednisoLONE acetate (PRED FORTE) 1 % ophthalmic suspension Place 1 drop into the right eye 4 (four) times daily.   rosuvastatin  (CRESTOR ) 10 MG tablet TAKE 1 TABLET BY MOUTH ONCE DAILY FOR CHOLESTEROL   traZODone  (DESYREL ) 100 MG tablet Take 1 tablet (100 mg total) by mouth at bedtime.   Vitamin D , Ergocalciferol , (DRISDOL ) 1.25 MG (50000 UNIT) CAPS capsule Take 1 capsule (50,000 Units total) by mouth every 7 (seven) days.   No facility-administered encounter medications on file as of 10/17/2024.    Allergies  Allergen Reactions   Misc. Sulfonamide Containing Compounds Nausea And Vomiting and Other (See Comments)   Codeine Other (See Comments)    Migraines   Sulfonamide Derivatives Nausea And Vomiting   Betadine [Povidone Iodine] Other (See Comments)    Blisters    Paxil  [Paroxetine  Hcl] Other (See Comments)    Extreme sweating   Vioxx [Rofecoxib] Rash    Review of Systems  Constitutional:  Negative for chills and fever.  HENT:  Negative for congestion and sore throat.   Gastrointestinal:  Positive for nausea. Negative  for blood in stool, constipation and diarrhea.       Resolve with pepto  Skin:  Negative for itching and rash.  Neurological:  Negative for dizziness and headaches.  Endo/Heme/Allergies:        Heat intolerance  Psychiatric/Behavioral:  The patient has insomnia.        3-4 hrs on trazodone          Objective:  There were no vitals taken for this visit.   Wt Readings from Last 3 Encounters:  10/14/24 205 lb 9.6 oz (93.3 kg)  07/10/24 206 lb (93.4 kg)  04/05/24 202 lb (91.6 kg)    Physical Exam Vitals reviewed.  Constitutional:      Appearance: She is obese.  HENT:     Head: Normocephalic and atraumatic.     Nose: Nose normal.  Eyes:     General: No scleral icterus.    Extraocular Movements: Extraocular movements intact.     Conjunctiva/sclera: Conjunctivae normal.     Pupils: Pupils are equal, round, and reactive to light.  Cardiovascular:     Heart sounds: Normal heart sounds.  Abdominal:     General: Bowel sounds are normal.     Palpations: Abdomen is soft.     Tenderness: There is no abdominal tenderness.  Musculoskeletal:        General: Normal range of motion.     Right lower leg: No edema.     Left lower leg: No edema.  Skin:    General: Skin is warm and dry.     Findings: No rash.  Neurological:     General: No focal deficit present.     Mental Status: She is oriented to person, place, and time.  Psychiatric:        Mood and Affect: Mood normal.        Behavior: Behavior normal.        Thought Content: Thought content normal.        Judgment: Judgment  normal.    Physical Exam      Results for orders placed or performed in visit on 10/14/24  Veritor Flu A/B Waived   Collection Time: 10/14/24  9:47 AM  Result Value Ref Range   Influenza A Negative Negative   Influenza B Negative Negative       Pertinent labs & imaging results that were available during my care of the patient were reviewed by me and considered in my medical decision  making.  Assessment & Plan:  There are no diagnoses linked to this encounter.   Assessment and Plan [name] is a 63 year old Caucasian female seen today for chronic disease management, no acute distress Assessment & Plan Nausea and Constipation Persistent nausea with recent constipation. Zofran  ineffective. - Recommended ginger ale for nausea. - Advised against sugary drinks. - Prescribed dicyclomine.  Insomnia Difficulty sleeping, possibly due to nausea and menopause. Trazodone  and melatonin ineffective. - Prescribed doxepin for sleep. D/c trazodone  - Advised sleep hygiene: avoid TV before bed, drink warm milk.  Menopausal symptoms (hot flashes, sweats) Experiencing hot flashes and sweats, likely menopausal. - Recommended OTC Evening Primrose oil to help aid for symptoms.  Personal history of colonic polyps and colon cancer screening Due for colonoscopy due to previous polyps. - Referred to GI for colonoscopy.  Suspected thyroid disorder Cold intolerance and sweating suggest thyroid dysfunction. - Ordered thyroid function tests.      Continue all other maintenance medications.  Follow up plan: No follow-ups on file.   Continue healthy lifestyle choices, including diet (rich in fruits, vegetables, and lean proteins, and low in salt and simple carbohydrates) and exercise (at least 30 minutes of moderate physical activity daily).  Educational handout given for    Clinical References  Inflammation of the Stomach in Adults (Gastritis): What to Know Gastritis is when the lining of your stomach gets red and swollen (inflammation). There're two kinds of gastritis. There's gastritis that happens quickly. This is called acute gastritis. There's gastritis that happens over a long time. This is called chronic gastritis. Gastritis must be treated. If not, you can have sores or bleeding in your stomach. What are the causes? Germs that get to your stomach and cause an  infection. Drinking too much alcohol. Taking some medicines. Too much acid in the stomach. Disease of the stomach. Allergies. Cancer treatments (radiation). Smoking cigarettes or using products that contain nicotine or tobacco. What increases the risk? Having a disease of the intestines. Having an autoimmune disease, such as Crohn's disease. This is when your immune system attacks other organs in the body. Using aspirin, ibuprofen, and other NSAIDs to treat other conditions. Stress. What are the signs or symptoms? Pain or burning in your belly. Feeling like you may throw up. Throwing up. Feeling too full after you eat. Losing weight. Bad breath. Blood in your throw-up or poop. In some cases, there are no symptoms. How is this treated? Gastritis is treated with medicines. The medicines that are used depend on what caused the condition. If the condition was caused by germs (bacteria), you'll be given antibiotics. If the condition was caused by too much acid in the stomach, you'll be given antacids, H2 blockers, or proton pump inhibitors. You may also be told to stop taking certain medicines, such as aspirin, ibuprofen, and other NSAIDs. Follow these instructions at home: Medicines Take your medicines only as told. Take your antibiotics as told. Do not stop taking them even if you start to feel better. Alcohol use Do not  drink alcohol if: Your doctor tells you not to drink. You are pregnant, may be pregnant, or are planning to become pregnant. If you drink alcohol: Limit how much you have to: 0-1 drink a day if you're female. 0-2 drinks a day if you're female. Know how much alcohol is in your drink. In the [place] one drink is one 12 oz bottle of beer (355 mL), one 5 oz glass of wine (148 mL), or one 1 oz glass of hard liquor (44 mL). General instructions Eat small meals often, instead of large meals. Avoid foods and drinks that make your symptoms worse. Drink more fluid as  told. Do not smoke, vape, or use nicotine or tobacco. Talk with your doctor about ways to manage stress. You may be told to: Get regular exercise. Do deep breathing. Do meditation or yoga. Contact a doctor if: Your symptoms get worse. The pain in your belly gets worse. Your symptoms go away and then come back. You have a fever. Get help right away if: You throw up blood or a substance that looks like coffee grounds. You have black or dark red poop. You throw up every time you drink something. These symptoms may be an emergency. Call 911 right away. Do not wait to see if the symptoms will go away. Do not drive yourself to the hospital. This information is not intended to replace advice given to you by your health care provider. Make sure you discuss any questions you have with your health care provider. Document Revised: 01/30/2024 Document Reviewed: 01/30/2024 Elsevier Patient Education  2025 [employer]. Menopause: What to Know Menopause is the time in your life when your menstrual periods stop. It marks the end of your ability to get pregnant. It can be defined as not having a period for 12 months without another medical cause. The time when you start to move into menopause is called perimenopause. It often happens between ages 43-55. It can last for many years. During perimenopause, hormone levels change in your body. This can cause symptoms and affect your health. Menopause may make you more likely to have: Bones that are weak and break more easily. Depression. This is when you feel sad or hopeless. Arteries that harden and get narrow. These can cause heart attacks and strokes. What are the causes? In most cases, menopause is a natural change to your body and hormone levels that happens as you get older. But in some cases, it may be caused by changes that aren't natural. These include: Surgery to take out both ovaries. Side effects from some medicines. What increases the  risk? You're more likely to go through menopause early if: You have an abnormal growth (tumor) of the pituitary gland in your brain. You have a disease that affects your ovaries. You've had certain treatments for cancer. These include: Chemotherapy. Hormone therapy. Radiation therapy on the area between your hips (pelvis). You smoke a lot or drink a lot of alcohol. Other people in your family have gone through menopause early. You're very thin. What are the signs or symptoms? You may have: Hot flashes. Irregular periods. Night sweats. Changes in how you feel about sex. You may: Have less of a sex drive. Feel more discomfort around your sexuality. Vaginal dryness and thinning of the vaginal walls. This may make it hurt to have sex. Skin changes, such as: Dry skin. New wrinkles. Headaches. Other symptoms may include: Trouble sleeping. Mood swings. Memory problems. Weight gain. Hair growth on your face and  chest. Bladder infections or trouble peeing. How is this diagnosed? You may be diagnosed based on: Your medical history. An exam. Your age. Your history of menstrual periods. Your symptoms. Hormone tests. How is this treated? In some cases, no treatment is needed. Talk with your health care provider about if you should get treated. Treatments may include: Menopausal hormone therapy (MHT). Medicines to treat certain symptoms. Acupuncture. Vitamin or herbal supplements. Before you start treatment, let your provider know if you or anyone in your family has or has had: Heart disease. Breast cancer. Blood clots. Diabetes. Osteoporosis. Follow these instructions at home: Eating and drinking  Eat a balanced diet. It should include: Fresh fruits and vegetables. Whole grains. Lean protein. Low-fat dairy. Eat lots of foods that have calcium  and vitamin D  in them. These can help keep your bones healthy. Foods and drinks that are rich in calcium  include: Yogurt and  low-fat milk. Beans. Almonds. Sardines. Broccoli and kale. To help prevent hot flashes, stay away from: Alcohol. Drinks with caffeine in them. Spicy foods. Lifestyle Do not smoke, vape, or use nicotine or tobacco. Get 7-8 hours of sleep each night. If you have hot flashes, you may want to: Dress in layers. Avoid things that may trigger hot flashes, like warm places or stress. Take slow, deep breaths when a hot flash starts. Keep a fan in your home and office. Find ways to manage stress. You may want to try: Deep breathing. Meditation. Writing in a journal. Ask your provider about going to group therapy. Therapy can help you get support from others who are going through menopause. General instructions  Talk with your provider before you take any herbal supplements. Keep track of your symptoms. Track: When they start. How often you have them. How long they last. Use vaginal lubricants or moisturizers. These can help with: Vaginal dryness. Comfort during sex. Contact a health care provider if: You're older than 55 and still get periods. You have pain during sex. You haven't had a period for 12 months and then start to bleed from your vagina. It hurts to pee. You get very bad headaches. Get help right away if: You're very depressed. You have a lot of bleeding from your vagina. Your heart is beating too fast. You have very bad belly pain or indigestion that doesn't go away with medicines. This information is not intended to replace advice given to you by your health care provider. Make sure you discuss any questions you have with your health care provider. Document Revised: 08/03/2023 Document Reviewed: 08/03/2023 Elsevier Patient Education  2024 Else[employer] Inc. Nausea, Adult Nausea is feeling like you may vomit. Feeling like you may vomit is usually not serious, but it may be an early sign of a more serious medical problem. Vomiting is when stomach contents forcefully come  out of your mouth. If you vomit, or if you are not able to drink enough fluids, you may not have enough water in your body (get dehydrated). If you do not have enough water in your body, you may: Feel tired. Feel thirsty. Have a dry mouth. Have cracked lips. Pee (urinate) less often. Older adults and people who have other diseases or a weak body defense system (immune system) have a higher risk of not having enough water in the body. The main goals of treating this condition are: To relieve your nausea. To ensure your nausea occurs less often. To prevent vomiting and losing too much fluid. Follow these instructions at home: Watch your  symptoms for any changes. Tell your doctor about them. Eating and drinking     Take an ORS (oral rehydration solution). This is a drink that is sold at pharmacies and stores. Drink clear fluids in small amounts as you are able. These include: Water. Ice chips. Fruit juice that has water added (diluted fruit juice). Low-calorie sports drinks. Eat bland, easy-to-digest foods in small amounts as you are able, such as: Bananas. Applesauce. Rice. Low-fat (lean) meats. Toast. Crackers. Avoid drinking fluids that have a lot of sugar or caffeine in them. This includes energy drinks, sports drinks, and soda. Avoid alcohol. Avoid spicy or fatty foods. General instructions Take over-the-counter and prescription medicines only as told by your doctor. Rest at home while you get better. Drink enough fluid to keep your pee (urine) pale yellow. Take slow and deep breaths when you feel like you may vomit. Avoid food or things that have strong smells. Wash your hands often with soap and water for at least 20 seconds. If you cannot use soap and water, use hand sanitizer. Make sure that everyone in your home washes their hands well and often. Keep all follow-up visits. Contact a doctor if: You feel worse. You feel like you may vomit and this lasts for more than  2 days. You vomit. You are not able to drink fluids without vomiting. You have new symptoms. You have a fever. You have a headache. You have muscle cramps. You have a rash. You have pain while peeing. You feel light-headed or dizzy. Get help right away if: You have pain in your chest, neck, arm, or jaw. You feel very weak or you faint. You have vomit that is bright red or looks like coffee grounds. You have bloody or black poop (stools) or poop that looks like tar. You have a very bad headache, a stiff neck, or both. You have very bad pain, cramping, or bloating in your belly (abdomen). You have trouble breathing or you are breathing very quickly. Your heart is beating very quickly. Your skin feels cold and clammy. You feel confused. You have signs of losing too much water in your body, such as: Dark pee, very little pee, or no pee. Cracked lips. Dry mouth. Sunken eyes. Sleepiness. Weakness. These symptoms may be an emergency. Get help right away. Call 911. Do not wait to see if the symptoms will go away. Do not drive yourself to the hospital. Summary Nausea is feeling like you are about vomit. If you vomit, or if you are not able to drink enough fluids, you may not have enough water in your body (get dehydrated). Eat and drink what your doctor tells you. Take over-the-counter and prescription medicines only as told by your doctor. Contact a doctor right away if your symptoms get worse or you have new symptoms. Keep all follow-up visits. This information is not intended to replace advice given to you by your health care provider. Make sure you discuss any questions you have with your health care provider. Document Revised: 06/04/2021 Document Reviewed: 06/04/2021 Elsevier Patient Education  2024 Elsevier Inc. Colonoscopy, Adult A colonoscopy is an exam to look at the large intestine. It is done using a long, thin, flexible tube that has a camera on the end. This exam is  done to check for problems, such as: An abnormal growth of cells or tissue (tumor). Abnormal growths within the lining of your intestine (polyps). Irritation and swelling (inflammation). Bleeding. Tell your doctor about: Any allergies you have. All  medicines you are taking. Tell him or her about vitamins, herbs, eye drops, creams, and over-the-counter medicines. Any problems you or family members have had with anesthetic medicines. Any bleeding problems you have. Any surgeries you have had. Any medical conditions you have. Any problems you have had with pooping (having bowel movements). Whether you are pregnant or may be pregnant. What are the risks? Generally, this is a safe procedure. However, problems may occur, such as: Bleeding. Damage to your intestine. Allergic reactions to medicines given during the procedure. Infection. This is rare. What happens before the procedure? Eating and drinking Follow instructions from your doctor about eating and drinking. These may include: A few days before the procedure: Follow a low-fiber diet. Avoid these foods: Nuts. Seeds. Dried fruit. Raw fruits. Vegetables. 1-3 days before the procedure: Eat only gelatin dessert or ice pops. Drink only clear liquids, such as: Water. Clear broth or bouillon. Black coffee or tea. Clear juice. Clear soft drinks or sports drinks. Avoid liquids that have red or purple dye. On the day of the procedure: Do not eat solid foods. You may continue to drink clear liquids until up to 2 hours before the procedure. Do not eat or drink anything starting 2 hours before the procedure or as told by your doctor. Bowel prep If you were prescribed a bowel prep to take by mouth (orally) to clean out your colon: Take it as told by your doctor. Starting the day before your procedure, you will need to drink a lot of liquid medicine. The liquid will cause you to poop until your poop is almost clear or light green. If  your skin or butt gets irritated from diarrhea, you may: Wipe the area with wipes that have medicine in them, such as adult wet wipes with aloe and vitamin E. Put something on your skin that soothes the area, such as petroleum jelly. If you vomit while drinking the bowel prep: Take a break for up to 60 minutes. Begin the bowel prep again. Call your doctor if you keep vomiting and you cannot take the bowel prep without vomiting. To clean out your colon, you may also be given: Laxative medicines. These help you poop. Instructions for using a liquid medicine (enema) injected into your butt. Medicines Ask your doctor about changing or stopping: Your normal medicines. Vitamins, herbs, and supplements. Over-the-counter medicines. Do not take aspirin or ibuprofen unless you are told to. General instructions Ask your doctor what steps will be taken to prevent the spread of germs. These may include washing skin with a soap that kills germs. If you will be going home right after the procedure, plan to have a responsible adult: Take you home from the hospital or clinic. You will not be allowed to drive. Care for you for the time you are told. What happens during the procedure?  An IV tube will be put into one of your veins. You will be given a medicine to make you fall asleep (general anesthetic). You will lie on your side with your knees bent. Oil or gel will be put on the tube. Then the tube will be: Put into the opening of the butt (anus). Gently put into your large intestine. Air will be sent into your colon to keep it open. You may feel some pressure or cramping. The camera will be used to take photos that will appear on a screen. A small tissue sample may be removed for testing (biopsy). If small growths are found, your doctor  may remove them and have them checked for cancer. The tube will be slowly removed. The procedure may vary among doctors and hospitals. What happens after the  procedure? You will be monitored until you leave the hospital or clinic. This includes checking your blood pressure, heart rate, breathing rate, and blood oxygen level. You may have a small amount of blood in your poop. You may pass gas. You may have mild cramps or bloating in your belly (abdomen). If you were given a sedative during your procedure, do not drive or use machines until your doctor says that it is safe. It is up to you to get the results of your procedure. Ask how to get your results when they are ready. Summary A colonoscopy is an exam to look at the large intestine. Follow instructions from your doctor about eating and drinking before the procedure. You may be prescribed an oral bowel prep to clean out your colon. Take it as told by your doctor. A flexible tube with a camera on its end will be put into the opening of the butt. It will be passed into the large intestine. This information is not intended to replace advice given to you by your health care provider. Make sure you discuss any questions you have with your health care provider. Document Revised: 11/22/2021 Document Reviewed: 07/21/2021 Elsevier Patient Education  2024 [employer]  The above assessment and management plan was discussed with the patient. The patient verbalized understanding of and has agreed to the management plan. Patient is aware to call the clinic if they develop any new symptoms or if symptoms persist or worsen. Patient is aware when to return to the clinic for a follow-up visit. Patient educated on when it is appropriate to go to the emergency department.   Jacqueline Agan [place] Thompson, DNP [facility] [address] [place], [place] [address] [phone]

## 2024-10-17 NOTE — Telephone Encounter (Signed)
 FYI Only or Action Required?: FYI only for provider: appointment scheduled on 10/17/2024 at 3:50 PM.  Patient was last seen in primary care on 10/14/2024 by [name], NP.  Called Nurse Triage reporting Diarrhea.  Symptoms began several days ago.  Interventions attempted: Rest, hydration, or home remedies.  Symptoms are: unchanged.  Triage Disposition: See HCP Within 4 Hours (Or PCP Triage)  Patient/caregiver understands and will follow disposition?:    Copied from CRM [id number]. Topic: Clinical - Red Word Triage >> Oct 17, 2024 11:47 AM Terri MATSU wrote: Red Word that prompted transfer to Nurse Triage: Nauseous and diarrhea and not sleeping good. Wakes up every 2 hrs. Reason for Disposition  [1] SEVERE diarrhea (e.g., 7 or more times / day more than normal) AND [2] age > 60 years  Answer Assessment - Initial Assessment Questions 1. DIARRHEA SEVERITY: How bad is the diarrhea? How many more stools have you had in the past 24 hours than normal?      Last had diarrhea yesterday-has been having multiple episodes of diarrhea since Saturday 2. ONSET: When did the diarrhea begin?      Started on Saturday 3. STOOL DESCRIPTION:  How loose or watery is the diarrhea? What is the stool color? Is there any blood or mucous in the stool?     Loose and watery 4. VOMITING: Are you also vomiting? If Yes, ask: How many times in the past 24 hours?      no 5. ABDOMEN PAIN: Are you having any abdomen pain? If Yes, ask: What does it feel like? (e.g., crampy, dull, intermittent, constant)      no 6. ABDOMEN PAIN SEVERITY: If present, ask: How bad is the pain?  (e.g., Scale 1-10; mild, moderate, or severe)     N/A 7. ORAL INTAKE: If vomiting, Have you been able to drink liquids? How much liquids have you had in the past 24 hours?     yes 8. HYDRATION: Any signs of dehydration? (e.g., dry mouth [not just dry lips], too weak to stand, dizziness, new weight loss)  When did you last urinate?     no 9. EXPOSURE: Have you traveled to a foreign country recently? Have you been exposed to anyone with diarrhea? Could you have eaten any food that was spoiled?     no 10. ANTIBIOTIC USE: Are you taking antibiotics now or have you taken antibiotics in the past 2 months?       no 11. OTHER SYMPTOMS: Do you have any other symptoms? (e.g., fever, blood in stool)       Nausea, difficulty sleeping  Protocols used: Diarrhea-A-AH

## 2024-10-18 ENCOUNTER — Ambulatory Visit

## 2024-10-18 LAB — THYROID PANEL WITH TSH
Free Thyroxine Index: 2.5 (ref 1.2–4.9)
T3 Uptake Ratio: 28 % (ref 24–39)
T4, Total: 9.1 ug/dL (ref 4.5–12.0)
TSH: 2.54 u[IU]/mL (ref 0.450–4.500)

## 2024-10-21 ENCOUNTER — Ambulatory Visit: Payer: Self-pay | Admitting: Nurse Practitioner

## 2024-10-23 ENCOUNTER — Ambulatory Visit

## 2024-10-23 DIAGNOSIS — R600 Localized edema: Secondary | ICD-10-CM

## 2024-10-23 DIAGNOSIS — G8929 Other chronic pain: Secondary | ICD-10-CM

## 2024-10-23 DIAGNOSIS — M25562 Pain in left knee: Secondary | ICD-10-CM | POA: Diagnosis not present

## 2024-10-23 DIAGNOSIS — M25662 Stiffness of left knee, not elsewhere classified: Secondary | ICD-10-CM

## 2024-10-23 NOTE — Therapy (Addendum)
 OUTPATIENT PHYSICAL THERAPY LOWER EXTREMITY TREATMENT  Patient Name: [name] MRN: [id number] DOB:1961/02/06, 63 y.o., female Today's Date: 10/23/2024  END OF SESSION:  PT End of Session - 10/23/24 0803     Visit Number 10    Number of Visits 12    Date for Recertification  10/25/24    PT Start Time 0800    PT Stop Time 0833    PT Time Calculation (min) 33 min    Activity Tolerance Patient tolerated treatment well    Behavior During Therapy WFL for tasks assessed/performed           Past Medical History:  Diagnosis Date   Allergy    Anxiety    Arthritis    Asthma    Cataract    Depression    bipolar disorder   Emphysema, unspecified (HCC)    Esophageal stricture    GERD (gastroesophageal reflux disease)    Heart defect    HLD (hyperlipidemia)    Hypothyroidism    IBS (irritable bowel syndrome)    IFG (impaired fasting glucose)    Migraine headache    Obesity    OSA (obstructive sleep apnea)    Pacemaker    Prediabetes    Reflux    Sleep apnea    SOB (shortness of breath)    Symptomatic bradycardia    Past Surgical History:  Procedure Laterality Date   ABDOMINAL HERNIA REPAIR     BREAST CYST EXCISION     left   CATARACT EXTRACTION Bilateral    CESAREAN SECTION  1983. 1987   Cesarean sections     x2   CHOLECYSTECTOMY     COLONOSCOPY     dental implants     foor surgery Right    screws    FOOT SURGERY     screws in left foot   HERNIA REPAIR  2005   PACEMAKER INSERTION     PPM GENERATOR CHANGEOUT N/A 10/07/2022   Procedure: PPM GENERATOR CHANGEOUT;  Surgeon: Waddell Danelle ORN, MD;  Location: [facility];  Service: Cardiovascular;  Laterality: N/A;   REPLACEMENT TOTAL KNEE Left    TUBAL LIGATION     VAGINAL HYSTERECTOMY     Patient Active Problem List   Diagnosis Date Noted   Menopausal syndrome (hot flashes) 10/17/2024   Screening for colon cancer 10/17/2024   Insomnia associated with menopause 10/17/2024   Acute gastritis without  hemorrhage 10/17/2024   Nausea and vomiting 10/14/2024   Encounter for removal of sutures 10/14/2024   Screening mammogram for breast cancer 10/14/2024   Flu-like symptoms 10/14/2024   Elevated antinuclear antibody (ANA) level 10/14/2024   Obesity (BMI 30-39.9) 10/14/2024   Acute gastroenteritis 10/14/2024   Primary osteoarthritis of left knee 07/16/2024   Moderate episode of recurrent major depressive disorder (HCC) 07/10/2024   Psychophysiological insomnia 07/10/2024   Astigmatism of both eyes with presbyopia 11/24/2023   Myopia of both eyes with astigmatism and presbyopia 06/01/2023   Nuclear sclerotic cataract of both eyes 06/01/2023   Atrial fibrillation (HCC) 01/10/2023   Prediabetes 08/29/2022   Migraine headache 08/29/2022   IFG (impaired fasting glucose) 08/29/2022   IBS (irritable bowel syndrome) 08/29/2022   HLD (hyperlipidemia) 08/29/2022   Heart defect 08/29/2022   Emphysema, unspecified (HCC) 08/29/2022   Asthma 08/29/2022   Arthritis 08/29/2022   Allergy 08/29/2022   Meralgia paresthetica 02/10/2021   Vitamin D  deficiency 10/26/2020   Morbid obesity (HCC) 07/01/2017   Anxiety and depression 04/13/2016  Hypothyroidism 07/04/2014   GERD (gastroesophageal reflux disease) 10/26/2011   PPM-St.Jude 02/14/2011   AV BLOCK, COMPLETE 10/28/2010   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 Henderson MD  REFERRING DIAG: Primary OA of left knee s/p total knee replacement  THERAPY DIAG:  Chronic pain of left knee  Stiffness of left knee, not elsewhere classified  Localized edema  Rationale for Evaluation and Treatment: Rehabilitation  ONSET DATE: 08/13/24 (surgery date).    SUBJECTIVE:   SUBJECTIVE STATEMENT: Pt denies any knee pain, reports having nausea and diarrhea for the past 5 days.  No fever.  All tests negative.  PERTINENT  HISTORY: Pacemaker.  See above.   PAIN:  Are you having pain? No  PRECAUTIONS: ICD/Pacemaker.  No ultrasound, no electrical stimulation.    RED FLAGS: None   WEIGHT BEARING RESTRICTIONS: No  FALLS:  Has patient fallen in last 6 months? No  LIVING ENVIRONMENT: Lives with: lives with their spouse Lives in: House/apartment Has following equipment at home: Single point cane  PLOF: Independent  PATIENT GOALS: Do more without pain.   OBJECTIVE:   PATIENT SURVEYS:  LEFS:  27/80.  EDEMA:  Circumferential: Left 4 cms > right.  PALPATION: Minimal anterior left knee tenderness.  Incisional site looks to be healing well.   LOWER EXTREMITY ROM:  -7 degrees to 100 degrees.    LOWER EXTREMITY MMT:  Patient easily able to perform an antigravity left SLR and SAQ.   GAIT: Patient was walking safely with a cane with a decrease in step and stride length.                                                                                                                                 TREATMENT DATE:  10/16/24  EXERCISE LOG  Exercise Repetitions and Resistance Comments  Nustep Lvl 3 x 15 mins; seat 6-5   Cybex Knee Flexion 40# x 4 mins   Cybex Knee Extension 10# x 4 mins   Leg Press    Rockerboard    Lunges 14 box x 4 mins   Step Downs    LAQs    Seated Marches    Seated Hip Abduction    Seated Hip Adduction    Seated Ham Curls    STSs    Goal Assessment See Below    Blank cell = exercise not performed today                                     EXERCISE LOG  Exercise Repetitions and Resistance Comments  Nustep 15 minutes beginning at seat 7 and finishing at seat 5.   Rockerboard 5 minutes   LAQ's 3# x 5 minutes.   SAQ's 5 minutes.  STW/M x 4 minutes along left ITB and PROM x 4 minutes LLLDS f/b LE elevation and vasopneumatic on low x 10 minutes.     09/25/24                                 EXERCISE LOG  Exercise Repetitions and Resistance Comments  Nustep  Lvl 3 x 15 mins; seat 8-7   Rockerboard 5 mins   Lunges 14 box x 3 mins   Forward Step Ups 6 box x 20 reps   LAQs 2# x 25 reps    Seated Marches 2# x 25 reps   Seated Hip Abduction Red x 3.5 mins   Seated Hip Adduction 3.5 mins   Seated Ham Curls Red x 25 reps   STSs     Blank cell = exercise not performed today   PATIENT EDUCATION:  Education details:  Person educated:  International aid/development worker:  Education comprehension:   HOME EXERCISE PROGRAM:   ASSESSMENT:  CLINICAL IMPRESSION: Pt arrives for today's treatment session denying any pain.  Pt reports that she is feeling much better.  Pt able to demonstrate 121 degrees of left knee flexion today without pain, meeting her ROM goal.  Pt states that she is able to perform all of her ADLs without her pain getting above a 3/10.  Pt encouraged to call the facility with any questions or concerns.  Pt has met all of the goals set forth by physical therapy at this time.  Pt denies any pain at completion of today's treatment session.  Pt ready for discharge at this time.   OBJECTIVE IMPAIRMENTS: Abnormal gait, decreased activity tolerance, decreased ROM, increased edema, and pain.   ACTIVITY LIMITATIONS: carrying, lifting, bending, and locomotion level  PARTICIPATION LIMITATIONS: meal prep, cleaning, laundry, community activity, and yard work  PERSONAL FACTORS: Time since onset of injury/illness/exacerbation are also affecting patient's functional outcome.   REHAB POTENTIAL: Excellent  CLINICAL DECISION MAKING: Stable/uncomplicated  EVALUATION COMPLEXITY: Low   GOALS:  SHORT TERM GOALS: Target date: 09/27/24  Ind with an initial HEP. Goal status: MET   LONG TERM GOALS: Target date: 12/12/24  Ind with an advanced HEP.  Goal status: MET  2.  Active knee flexion to 120 degrees+ so the patient can perform functional tasks and do so with pain not > 2-3/10. 10/22: 109 degrees   Goal status: IN PROGRESS  3.  Perform a reciprocating  stair gait with one railing with pain not > 2-3/10.  Goal status: MET  4.  Full active knee extension in order to normalize gait.  Goal status: MET  5.  Perform ADL's with pain not > 3/10.   10/22: 3-4/10; 11/12: <3/10 Goal status: IMET  6.  Improve LEFS score by at least 10 points.   10/22: 55/80  Goal status: MET   PLAN:  PT FREQUENCY: 2x/week  PT DURATION: 6 weeks  PLANNED INTERVENTIONS: 97110-Therapeutic exercises, 97530- Therapeutic activity, 97112- Neuromuscular re-education, 97535- Self Care, 02859- Manual therapy, 97016- Vasopneumatic device, Patient/Family education, and Cryotherapy  PLAN FOR NEXT SESSION: Nustep, progress to recumbent bike.  ROM, strengthening.     Delon DELENA Gosling, PTA 10/23/2024, 8:35 AM   PHYSICAL THERAPY DISCHARGE SUMMARY  Visits from Start of Care: 10.  Current functional level related to goals / functional outcomes: See above.   Remaining deficits: All goals met except LTG #2.     Education / Equipment: HEP.  Patient agrees to discharge. Patient goals were partially met. Patient is being discharged due to being pleased with the current functional level.    Chad Applegate MPT

## 2024-10-25 ENCOUNTER — Telehealth: Payer: Self-pay | Admitting: Gastroenterology

## 2024-10-25 NOTE — Telephone Encounter (Signed)
 Good afternoon Dr. Leigh,   Patient called requesting to schedule recall colonoscopy. She stated about 2 weeks ago she was having nausea and spouts of diarrhea. States she is no longer having symptoms. Patient states she has met her deductible for this year. Patient is requesting to know if she is able to be scheduled with another provider before the end of the year. Please review and advise on scheduling further.   Thank you

## 2024-10-26 NOTE — Telephone Encounter (Signed)
 This is fine with me, whatever her preference. If she meet criteria for the [facility] can book there for colonoscopy. I don't think I have any openings in my schedule for now unless cancellations arise.

## 2024-11-04 NOTE — Telephone Encounter (Signed)
 That is okay with me.  Scott - due to time constraints for this patient, if you have openings, would you be able to help with this patient's surveillance colonoscopy in December if you have openings?  Thanks for consideration.

## 2024-11-04 NOTE — Telephone Encounter (Signed)
 Good morning Dr. Leigh,   I contacted patient to advise that you currently do not have opening in your schedule for December. Patient is requesting to know if she is able to have procedure with Dr. Stacia this time around due to meeting her insurance deductible for the year. Please review and advise on scheduling further.   Thank you

## 2024-11-05 NOTE — Telephone Encounter (Signed)
 Yes I think so. Thanks for your help!  [name] can book directly with Dr. Stacia in the [facility] if she is otherwise meets criteria to have her case done in the [facility]. Thanks

## 2024-11-15 ENCOUNTER — Encounter: Payer: Self-pay | Admitting: Gastroenterology

## 2024-11-15 ENCOUNTER — Ambulatory Visit

## 2024-11-15 VITALS — Ht 65.0 in | Wt 205.0 lb

## 2024-11-15 DIAGNOSIS — Z860100 Personal history of colon polyps, unspecified: Secondary | ICD-10-CM

## 2024-11-15 MED ORDER — NA SULFATE-K SULFATE-MG SULF 17.5-3.13-1.6 GM/177ML PO SOLN: 1.0000 | Freq: Once | ORAL | 0 refills | Status: AC

## 2024-11-15 NOTE — Progress Notes (Unsigned)
 Pt's name and DOB verified at the beginning of the pre-visit with 2 identifiers  Pt denies any difficulty with ambulating,sitting, laying down or rolling side to side  Pt has no issues moving head neck or swallowing  No egg or soy allergy known to patient   No issues known to pt with past sedation  No FH of Malignant Hyperthermia  Pt is not on home 02   Pt is not on blood thinners   Pt denies issues with constipation    Pt is not on dialysis  Pt has pacemaker and has hx of bradycardia  Pt denies any upcoming cardiac testing  Chart not reviewed by CRNA prior to PV  Visit by phone  Pt states weight is 205 lb   Pt given  both [facility] main # and MD on call # prior to instructions.  Informed pt to come in at the time discussed and is shown on PV instructions.  Pt instructed to use Single[employer] or GoodRx for a price reduction on prep  Instructed pt where to find PV instructions in My Chart.  Instructed pt on all aspects of written instructions including med holds clothing to wear and foods to eat and not eat as well as after procedure legal restrictions and to call MD on call if needed.. Pt states understanding. Instructed pt to review instructions again prior to procedure and call main # given if has any questions or any issues. Pt states they will.

## 2024-11-18 ENCOUNTER — Inpatient Hospital Stay
Admission: RE | Admit: 2024-11-18 | Discharge: 2024-11-18 | Attending: Nurse Practitioner | Admitting: Nurse Practitioner

## 2024-11-19 ENCOUNTER — Other Ambulatory Visit: Payer: Self-pay | Admitting: *Deleted

## 2024-11-19 MED ORDER — LEVOTHYROXINE SODIUM 100 MCG PO TABS: 100.0000 ug | ORAL_TABLET | Freq: Every day | ORAL | 0 refills | Status: AC

## 2024-11-20 ENCOUNTER — Encounter (INDEPENDENT_AMBULATORY_CARE_PROVIDER_SITE_OTHER): Payer: Self-pay | Admitting: *Deleted

## 2024-11-20 ENCOUNTER — Encounter: Payer: Self-pay | Admitting: Nurse Practitioner

## 2024-11-20 ENCOUNTER — Telehealth: Admitting: Nurse Practitioner

## 2024-11-20 DIAGNOSIS — J0100 Acute maxillary sinusitis, unspecified: Secondary | ICD-10-CM

## 2024-11-20 DIAGNOSIS — J069 Acute upper respiratory infection, unspecified: Secondary | ICD-10-CM

## 2024-11-20 MED ORDER — AZITHROMYCIN 250 MG PO TABS: ORAL_TABLET | ORAL | 0 refills | Status: AC

## 2024-11-20 MED ORDER — AZELASTINE HCL 0.1 % NA SOLN: 1.0000 | Freq: Two times a day (BID) | NASAL | 5 refills | Status: DC

## 2024-11-20 NOTE — Progress Notes (Signed)
 Virtual Visit via video Note Due to COVID-19 pandemic this visit was conducted virtually. This visit type was conducted due to national recommendations for restrictions regarding the COVID-19 Pandemic (e.g. social distancing, sheltering in place) in an effort to limit this patient's exposure and mitigate transmission in our community. All issues noted in this document were discussed and addressed.  A physical exam was not performed with this format.   I connected with [name] on 11/20/2024 at 1140 by Name and DOB and verified that I am speaking with the correct person using two identifiers. [name] is currently located at Home and  during visit. The provider, Nena Deitra Morton Sebastian, DNP is located in their office at time of visit.  I discussed the limitations, risks, security and privacy concerns of performing an evaluation and management service by virtual visit and the availability of in person appointments. I also discussed with the patient that there may be a patient responsible charge related to this service. The patient expressed understanding and agreed to proceed.  Subjective:  Patient ID: [name], female    DOB: March 27, 1961, 63 y.o.   MRN: [id number]  Chief Complaint:  URI and Sinusitis (Symptoms started since last Friday, not relieve with Mucinex DM)   HPI: [name] is a 63 y.o. female presenting on 11/20/2024 for URI and Sinusitis (Symptoms started since last Friday, not relieve with Mucinex DM)   URI  This is a new problem. The current episode started in the past 7 days. The problem has been unchanged. There has been no fever. Associated symptoms include congestion and coughing. Pertinent negatives include no chest pain, headaches, nausea, rash, sore throat or vomiting. She has tried decongestant (Mucinex) for the symptoms. The treatment provided no relief.  Sinusitis This is a new problem. The current episode started in the past 7 days. The problem is  unchanged. There has been no fever. Associated symptoms include congestion, coughing and sinus pressure. Pertinent negatives include no headaches or sore throat. Past treatments include oral decongestants. The treatment provided no relief.     Relevant past medical, surgical, family, and social history reviewed and updated as indicated.  Allergies and medications reviewed and updated.   Past Medical History:  Diagnosis Date   Allergy    Anxiety    Arthritis    Asthma    Cataract    Depression    bipolar disorder   Emphysema, unspecified (HCC)    Esophageal stricture    GERD (gastroesophageal reflux disease)    Heart defect    HLD (hyperlipidemia)    Hypothyroidism    IBS (irritable bowel syndrome)    IFG (impaired fasting glucose)    Migraine headache    Obesity    OSA (obstructive sleep apnea)    Pacemaker    Prediabetes    Reflux    Sleep apnea    SOB (shortness of breath)    Symptomatic bradycardia     Past Surgical History:  Procedure Laterality Date   ABDOMINAL HERNIA REPAIR     BREAST CYST EXCISION     left   CATARACT EXTRACTION Bilateral    CESAREAN SECTION  1983. 1987   Cesarean sections     x2   CHOLECYSTECTOMY     COLONOSCOPY     dental implants     foor surgery Right    screws    FOOT SURGERY     screws in left foot   HERNIA REPAIR  2005   PACEMAKER INSERTION     PPM GENERATOR CHANGEOUT N/A 10/07/2022   Procedure: PPM GENERATOR CHANGEOUT;  Surgeon: Waddell Danelle ORN, MD;  Location: [facility] INVASIVE CV LAB;  Service: Cardiovascular;  Laterality: N/A;   REPLACEMENT TOTAL KNEE Left    TUBAL LIGATION     VAGINAL HYSTERECTOMY      Social History   Socioeconomic History   Marital status: Married    Spouse name: Lynwood   Number of children: 2   Years of education: Not on file   Highest education level: GED or equivalent  Occupational History   Occupation: retired    Associate Professor: [employer]   Occupation: Nana, Housewife  Tobacco Use   Smoking status:  Former    Current packs/day: 0.00    Types: Cigarettes    Quit date: 07/08/2022    Years since quitting: 2.3   Smokeless tobacco: Never  Vaping Use   Vaping status: Former  Substance and Sexual Activity   Alcohol use: No    Alcohol/week: 0.0 standard drinks of alcohol    Comment: Occ.    Drug use: No   Sexual activity: Not on file  Other Topics Concern   Not on file  Social History Narrative   Unemployed. 10 cans of soda per day.    Social Drivers of Corporate Investment Banker Strain: Low Risk  (10/10/2024)   Overall Financial Resource Strain (CARDIA)    Difficulty of Paying Living Expenses: Not very hard  Food Insecurity: No Food Insecurity (10/10/2024)   Hunger Vital Sign    Worried About Running Out of Food in the Last Year: Never true    Ran Out of Food in the Last Year: Never true  Transportation Needs: No Transportation Needs (10/10/2024)   PRAPARE - Administrator, Civil Service (Medical): No    Lack of Transportation (Non-Medical): No  Physical Activity: Insufficiently Active (10/10/2024)   Exercise Vital Sign    Days of Exercise per Week: 1 day    Minutes of Exercise per Session: 10 min  Stress: Stress Concern Present (10/10/2024)   [employer] of Occupational Health - Occupational Stress Questionnaire    Feeling of Stress: Rather much  Social Connections: Moderately Isolated (10/10/2024)   Social Connection and Isolation Panel    Frequency of Communication with Friends and Family: Three times a week    Frequency of Social Gatherings with Friends and Family: Patient declined    Attends Religious Services: Patient declined    Active Member of Clubs or Organizations: No    Attends Engineer, Structural: Not on file    Marital Status: Married  Catering Manager Violence: Not At Risk (08/13/2024)   Received from [facility]   HITS    Over the last 12 months how often did your partner physically hurt you?: Never    Over the last 12 months  how often did your partner insult you or talk down to you?: Never    Over the last 12 months how often did your partner threaten you with physical harm?: Never    Over the last 12 months how often did your partner scream or curse at you?: Never    Outpatient Encounter Medications as of 11/20/2024  Medication Sig   azelastine (ASTELIN) 0.1 % nasal spray Place 1 spray into both nostrils 2 (two) times daily. Use in each nostril as directed   azithromycin (ZITHROMAX) 250 MG tablet Take 2 tablets on day 1, then 1  tablet daily on days 2 through 5   albuterol  (VENTOLIN  HFA) 108 (90 Base) MCG/ACT inhaler INHALE 2 PUFFS BY MOUTH EVERY 4 HOURS AS NEEDED FOR WHEEZING OR SHORTNESS OF BREATH (Patient taking differently: as needed. INHALE 2 PUFFS BY MOUTH EVERY 4 HOURS AS NEEDED FOR WHEEZING OR SHORTNESS OF BREATH)   benzonatate  (TESSALON  PERLES) 100 MG capsule Take 1 capsule (100 mg total) by mouth 3 (three) times daily as needed.   budeson-glycopyrrolate-formoterol (BREZTRI  AEROSPHERE) 160-9-4.8 MCG/ACT AERO inhaler Inhale 2 puffs into the lungs 2 (two) times daily. (Patient taking differently: Inhale 2 puffs into the lungs as needed.)   doxepin  (SINEQUAN ) 10 MG capsule Take 1 capsule (10 mg total) by mouth at bedtime as needed. (Patient not taking: Reported on 11/15/2024)   escitalopram  (LEXAPRO ) 10 MG tablet Take 1 tablet by mouth once daily   levothyroxine  (SYNTHROID ) 100 MCG tablet Take 1 tablet (100 mcg total) by mouth daily.   omega-3 acid ethyl esters (LOVAZA) 1 g capsule Take by mouth.   omeprazole  (PRILOSEC) 20 MG capsule Take 1 capsule (20 mg total) by mouth 2 (two) times daily.   ondansetron  (ZOFRAN -ODT) 4 MG disintegrating tablet Take 1 tablet (4 mg total) by mouth every 8 (eight) hours as needed for nausea or vomiting.   prednisoLONE acetate (PRED FORTE) 1 % ophthalmic suspension Place 1 drop into the right eye 4 (four) times daily.   rosuvastatin  (CRESTOR ) 10 MG tablet TAKE 1 TABLET BY MOUTH ONCE  DAILY FOR CHOLESTEROL   Vitamin D , Ergocalciferol , (DRISDOL ) 1.25 MG (50000 UNIT) CAPS capsule Take 1 capsule (50,000 Units total) by mouth every 7 (seven) days.   No facility-administered encounter medications on file as of 11/20/2024.    Allergies  Allergen Reactions   Misc. Sulfonamide Containing Compounds Nausea And Vomiting and Other (See Comments)   Bentyl  [Dicyclomine ] Other (See Comments)    Felt extra heavy hard to move   Codeine Other (See Comments)    Migraines   Sulfonamide Derivatives Nausea And Vomiting   Betadine [Povidone Iodine] Other (See Comments)    Blisters    Paxil  [Paroxetine  Hcl] Other (See Comments)    Extreme sweating   Vioxx [Rofecoxib] Rash    Review of Systems  Constitutional:  Negative for fatigue and fever.  HENT:  Positive for congestion, postnasal drip and sinus pressure. Negative for sore throat.        Green drainage  Respiratory:  Positive for cough.   Cardiovascular:  Negative for chest pain and palpitations.  Gastrointestinal:  Negative for nausea and vomiting.  Musculoskeletal:  Negative for myalgias.  Skin:  Negative for rash.  Neurological:  Negative for dizziness and headaches.         Observations/Objective: No vital signs or physical exam, this was a virtual health encounter.  Pt alert and oriented, answers all questions appropriately, and able to speak in full sentences.   Physical Exam Constitutional:      General: She is not in acute distress. HENT:     Head: Normocephalic and atraumatic.     Nose: Congestion present.  Eyes:     Extraocular Movements: Extraocular movements intact.     Conjunctiva/sclera: Conjunctivae normal.     Pupils: Pupils are equal, round, and reactive to light.  Pulmonary:     Effort: Pulmonary effort is normal.  Neurological:     Mental Status: She is alert and oriented to person, place, and time.     Assessment and Plan: Cheila was seen today for  uri and sinusitis.  Diagnoses and all  orders for this visit:  Acute non-recurrent maxillary sinusitis -     azithromycin (ZITHROMAX) 250 MG tablet; Take 2 tablets on day 1, then 1 tablet daily on days 2 through 5 -     azelastine (ASTELIN) 0.1 % nasal spray; Place 1 spray into both nostrils 2 (two) times daily. Use in each nostril as directed  URI with cough and congestion -     azithromycin (ZITHROMAX) 250 MG tablet; Take 2 tablets on day 1, then 1 tablet daily on days 2 through 74   Is a 63 year old Caucasian female seen today via telehealth for sinusitis, no acute distress Z-Pak No. 6 dispense instructed to take 2 tablets today and continue with 1 tablet daily until gone Astelin twice daily, continue over-the-counter Mucinex  Follow Up Instructions: No follow-ups on file.    I discussed the assessment and treatment plan with the patient. The patient was provided an opportunity to ask questions and all were answered. The patient agreed with the plan and demonstrated an understanding of the instructions.   The patient was advised to call back or seek an in-person evaluation if the symptoms worsen or if the condition fails to improve as anticipated.  The above assessment and management plan was discussed with the patient. The patient verbalized understanding of and has agreed to the management plan. Patient is aware to call the clinic if they develop any new symptoms or if symptoms persist or worsen. Patient is aware when to return to the clinic for a follow-up visit. Patient educated on when it is appropriate to go to the emergency department.     Clinical References  How to Perform a Sinus Rinse A sinus rinse is a home treatment. It rinses your sinuses with a mixture of salt and water (saline solution). Sinuses are air-filled spaces in your skull behind the bones of your face and forehead. They open into your nasal cavity. A sinus rinse can help to clear your nasal cavity. It can clear mucus, dirt, dust, or pollen. You may  do a sinus rinse when you have: A cold. A virus. Allergies. A sinus infection. A stuffy nose. What are the risks? A sinus rinse is normally very safe and helpful. However, there are a few risks. These include: A burning feeling in the sinuses. This may happen if you do not make the saline solution as told. Follow all directions. Nasal irritation. Infection from unclean water. This is rare, but it can happen. Do not do a sinus rinse if you have had: Ear or nasal surgery. An ear infection. Plugged ears. Supplies needed: Saline solution or powder. Distilled or germ-free (sterile) water may be needed to mix with saline powder. You may use boiled and cooled tap water. Boil tap water for 5 minutes. Cool the water until it is lukewarm. Use within 24 hours. Do not use regular tap water to mix with the saline solution. Neti pot or nasal rinse bottle. These release the saline solution into your nose and through your sinuses. You can buy neti pots and rinse bottles: At your local pharmacy. At a health food store. Online. How to do a sinus rinse  Wash your hands with soap and water for at least 20 seconds. If you cannot use soap and water, use hand sanitizer. Wash your device using the directions that came with it. Dry your device. Use the solution that comes with your device or one that is sold separately in  stores. Follow the mixing directions on the package if you need to mix with germ-free or distilled water. Fill your device with the amount of saline solution stated in the device instructions. Stand by a sink and tilt your head sideways over the sink. Place the spout of the device in your upper nostril (the one closer to the ceiling). Gently pour or squeeze the saline solution into your nasal cavity. The liquid should drain to your lower nostril if you are not too stuffed up (congested). While rinsing, breathe through your open mouth. Gently blow your nose to clear any mucus and rinse  solution. Blowing too hard may cause ear pain. Turn your head in the other direction and repeat in your other nostril. Clean and rinse your device with clean water. Air-dry your device. Talk with your doctor or pharmacist if you have questions about how to do a sinus rinse. Summary A sinus rinse is a home treatment. It rinses your sinuses with a mixture of salt and water (saline solution). A sinus rinse can clear mucus, dirt, dust, or pollen. A sinus rinse is normally very safe and helpful. Follow all instructions carefully. This information is not intended to replace advice given to you by your health care provider. Make sure you discuss any questions you have with your health care provider. Document Revised: 05/17/2021 Document Reviewed: 05/17/2021 Elsevier Patient Education  2024 Else[employer] Inc. Sinus Infection, Adult A sinus infection, also called sinusitis, is inflammation of your sinuses. Sinuses are hollow spaces in the bones around your face. Your sinuses are located: Around your eyes. In the middle of your forehead. Behind your nose. In your cheekbones. Mucus normally drains out of your sinuses. When your nasal tissues become inflamed or swollen, mucus can become trapped or blocked. This allows bacteria, viruses, and fungi to grow, which leads to infection. Most infections of the sinuses are caused by a virus. A sinus infection can develop quickly. It can last for up to 4 weeks (acute) or for more than 12 weeks (chronic). A sinus infection often develops after a cold. What are the causes? This condition is caused by anything that creates swelling in the sinuses or stops mucus from draining. This includes: Allergies. Asthma. Infection from bacteria or viruses. Deformities or blockages in your nose or sinuses. Abnormal growths in the nose (nasal polyps). Pollutants, such as chemicals or irritants in the air. Infection from fungi. This is rare. What increases the risk? You are more  likely to develop this condition if you: Have a weak body defense system (immune system). Do a lot of swimming or diving. Overuse nasal sprays. Smoke. What are the signs or symptoms? The main symptoms of this condition are pain and a feeling of pressure around the affected sinuses. Other symptoms include: Stuffy nose or congestion that makes it difficult to breathe through your nose. Thick yellow or greenish drainage from your nose. Tenderness, swelling, and warmth over the affected sinuses. A cough that may get worse at night. Decreased sense of smell and taste. Extra mucus that collects in the throat or the back of the nose (postnasal drip) causing a sore throat or bad breath. Tiredness (fatigue). Fever. How is this diagnosed? This condition is diagnosed based on: Your symptoms. Your medical history. A physical exam. Tests to find out if your condition is acute or chronic. This may include: Checking your nose for nasal polyps. Viewing your sinuses using a device that has a light (endoscope). Testing for allergies or bacteria. Imaging tests,  such as an MRI or CT scan. In rare cases, a bone biopsy may be done to rule out more serious types of fungal sinus disease. How is this treated? Treatment for a sinus infection depends on the cause and whether your condition is chronic or acute. If caused by a virus, your symptoms should go away on their own within 10 days. You may be given medicines to relieve symptoms. They include: Medicines that shrink swollen nasal passages (decongestants). A spray that eases inflammation of the nostrils (topical intranasal corticosteroids). Rinses that help get rid of thick mucus in your nose (nasal saline washes). Medicines that treat allergies (antihistamines). Over-the-counter pain relievers. If caused by bacteria, your health care provider may recommend waiting to see if your symptoms improve. Most bacterial infections will get better without  antibiotic medicine. You may be given antibiotics if you have: A severe infection. A weak immune system. If caused by narrow nasal passages or nasal polyps, surgery may be needed. Follow these instructions at home: Medicines Take, use, or apply over-the-counter and prescription medicines only as told by your health care provider. These may include nasal sprays. If you were prescribed an antibiotic medicine, take it as told by your health care provider. Do not stop taking the antibiotic even if you start to feel better. Hydrate and humidify  Drink enough fluid to keep your urine pale yellow. Staying hydrated will help to thin your mucus. Use a cool mist humidifier to keep the humidity level in your home above 50%. Inhale steam for 10-15 minutes, 3-4 times a day, or as told by your health care provider. You can do this in the bathroom while a hot shower is running. Limit your exposure to cool or dry air. Rest Rest as much as possible. Sleep with your head raised (elevated). Make sure you get enough sleep each night. General instructions  Apply a warm, moist washcloth to your face 3-4 times a day or as told by your health care provider. This will help with discomfort. Use nasal saline washes as often as told by your health care provider. Wash your hands often with soap and water to reduce your exposure to germs. If soap and water are not available, use hand sanitizer. Do not smoke. Avoid being around people who are smoking (secondhand smoke). Keep all follow-up visits. This is important. Contact a health care provider if: You have a fever. Your symptoms get worse. Your symptoms do not improve within 10 days. Get help right away if: You have a severe headache. You have persistent vomiting. You have severe pain or swelling around your face or eyes. You have vision problems. You develop confusion. Your neck is stiff. You have trouble breathing. These symptoms may be an emergency. Get  help right away. Call 911. Do not wait to see if the symptoms will go away. Do not drive yourself to the hospital. Summary A sinus infection is soreness and inflammation of your sinuses. Sinuses are hollow spaces in the bones around your face. This condition is caused by nasal tissues that become inflamed or swollen. The swelling traps or blocks the flow of mucus. This allows bacteria, viruses, and fungi to grow, which leads to infection. If you were prescribed an antibiotic medicine, take it as told by your health care provider. Do not stop taking the antibiotic even if you start to feel better. Keep all follow-up visits. This is important. This information is not intended to replace advice given to you by your health care  provider. Make sure you discuss any questions you have with your health care provider. Document Revised: 11/02/2021 Document Reviewed: 11/02/2021 Elsevier Patient Education  2024 Elsevier Inc. Upper Respiratory Infection, Adult An upper respiratory infection (URI) affects the nose, throat, and upper airways that lead to the lungs. The most common type of URI is often called the common cold. URIs usually get better on their own, without medical treatment. What are the causes? A URI is caused by a germ (virus). You may catch these germs by: Breathing in droplets from an infected person's cough or sneeze. Touching something that has the germ on it (is contaminated) and then touching your mouth, nose, or eyes. What increases the risk? You are more likely to get a URI if: You are very young or very old. You have close contact with others, such as at work, school, or a health care facility. You smoke. You have long-term (chronic) heart or lung disease. You have a weakened disease-fighting system (immune system). You have nasal allergies or asthma. You have a lot of stress. You have poor nutrition. What are the signs or symptoms? Runny or stuffy (congested)  nose. Cough. Sneezing. Sore throat. Headache. Feeling tired (fatigue). Fever. Not wanting to eat as much as usual. Pain in your forehead, behind your eyes, and over your cheekbones (sinus pain). Muscle aches. Redness or irritation of the eyes. Pressure in the ears or face. How is this treated? URIs usually get better on their own within 7-10 days. Medicines cannot cure URIs, but your doctor may recommend certain medicines to help relieve symptoms, such as: Over-the-counter cold medicines. Medicines to reduce coughing (cough suppressants). Coughing is a type of defense against infection that helps to clear the nose, throat, windpipe, and lungs (respiratory system). Take these medicines only as told by your doctor. Medicines to lower your fever. Follow these instructions at home: Activity Rest as needed. If you have a fever, stay home from work or school until your fever is gone, or until your doctor says you may return to work or school. You should stay home until you cannot spread the infection anymore (you are not contagious). Your doctor may have you wear a face mask so you have less risk of spreading the infection. Relieving symptoms Rinse your mouth often with salt water. To make salt water, dissolve -1 tsp (3-6 g) of salt in 1 cup (237 mL) of warm water. Use a cool-mist humidifier to add moisture to the air. This can help you breathe more easily. Eating and drinking  Drink enough fluid to keep your pee (urine) pale yellow. Eat soups and other clear broths. General instructions  Take over-the-counter and prescription medicines only as told by your doctor. Do not smoke or use any products that contain nicotine or tobacco. If you need help quitting, ask your doctor. Avoid being where people are smoking (avoid secondhand smoke). Stay up to date on all your shots (immunizations), and get the flu shot every year. Keep all follow-up visits. How to prevent the spread of infection  to others  Wash your hands with soap and water for at least 20 seconds. If you cannot use soap and water, use hand sanitizer. Avoid touching your mouth, face, eyes, or nose. Cough or sneeze into a tissue or your sleeve or elbow. Do not cough or sneeze into your hand or into the air. Contact a doctor if: You are getting worse, not better. You have any of these: A fever or chills. Brown or red  mucus in your nose. Yellow or brown fluid (discharge)coming from your nose. Pain in your face, especially when you bend forward. Swollen neck glands. Pain when you swallow. White areas in the back of your throat. Get help right away if: You have shortness of breath that gets worse. You have very bad or constant: Headache. Ear pain. Pain in your forehead, behind your eyes, and over your cheekbones (sinus pain). Chest pain. You have long-lasting (chronic) lung disease along with any of these: Making high-pitched whistling sounds when you breathe, most often when you breathe out (wheezing). Long-lasting cough (more than 14 days). Coughing up blood. A change in your usual mucus. You have a stiff neck. You have changes in your: Vision. Hearing. Thinking. Mood. These symptoms may be an emergency. Get help right away. Call 911. Do not wait to see if the symptoms will go away. Do not drive yourself to the hospital. Summary An upper respiratory infection (URI) is caused by a germ (virus). The most common type of URI is often called the common cold. URIs usually get better within 7-10 days. Take over-the-counter and prescription medicines only as told by your doctor. This information is not intended to replace advice given to you by your health care provider. Make sure you discuss any questions you have with your health care provider. Document Revised: 06/30/2021 Document Reviewed: 06/30/2021 Elsevier Patient Education  2024 Elsevier Inc.  I provided 15 minutes of time during this video  encounter.   Neely Kammerer [place] Thompson, DNP [facility] [address] [place], [place] [address] [phone] 11/20/2024

## 2024-11-21 ENCOUNTER — Telehealth: Payer: Self-pay | Admitting: Gastroenterology

## 2024-11-21 NOTE — Telephone Encounter (Signed)
 Spoke with pt. States she has been to her doctor and was started on a Z-pack for a sinus infection. Denies fever or SOB. She feels okay to proceed with colonoscopy tomorrow. Instructed her to call back if she does develop a fever or difficulty breathing. Pt verbalized understanding.

## 2024-11-21 NOTE — Telephone Encounter (Signed)
 Spoke w pt about colonoscopy tomorrow. Pt states the she is having cold symptoms and wasn't sure if she should come tomorrow. Pt requesting advice for care. Please advise. Thank you!

## 2024-11-22 ENCOUNTER — Ambulatory Visit: Admitting: Gastroenterology

## 2024-11-22 ENCOUNTER — Encounter: Payer: Self-pay | Admitting: Gastroenterology

## 2024-11-22 VITALS — BP 135/102 | HR 55 | Temp 97.9°F | Resp 15 | Ht 66.0 in | Wt 206.0 lb

## 2024-11-22 DIAGNOSIS — D123 Benign neoplasm of transverse colon: Secondary | ICD-10-CM

## 2024-11-22 DIAGNOSIS — D124 Benign neoplasm of descending colon: Secondary | ICD-10-CM

## 2024-11-22 DIAGNOSIS — Z860101 Personal history of adenomatous and serrated colon polyps: Secondary | ICD-10-CM

## 2024-11-22 DIAGNOSIS — Z1211 Encounter for screening for malignant neoplasm of colon: Secondary | ICD-10-CM

## 2024-11-22 DIAGNOSIS — Z860100 Personal history of colon polyps, unspecified: Secondary | ICD-10-CM

## 2024-11-22 MED ORDER — SODIUM CHLORIDE 0.9 % IV SOLN: 500.0000 mL | Freq: Once | INTRAVENOUS | Status: DC

## 2024-11-22 NOTE — Op Note (Signed)
 [facility] Patient Name: [name] Procedure Date: 11/22/2024 11:38 AM MRN: [id number] Endoscopist: Glendia E. Stacia , MD, [id number] Age: 63 Referring MD:  Date of Birth: 1961-12-07 Gender: Female Account #: [id number] Procedure:                Colonoscopy Indications:              High risk colon cancer surveillance: Personal                            history of multiple (3 or more) adenomas, Last                            colonoscopy: June 2020 Medicines:                Monitored Anesthesia Care Procedure:                Pre-Anesthesia Assessment:                           - Prior to the procedure, a History and Physical                            was performed, and patient medications and                            allergies were reviewed. The patient's tolerance of                            previous anesthesia was also reviewed. The risks                            and benefits of the procedure and the sedation                            options and risks were discussed with the patient.                            All questions were answered, and informed consent                            was obtained. Prior Anticoagulants: The patient has                            taken no anticoagulant or antiplatelet agents. ASA                            Grade Assessment: III - A patient with severe                            systemic disease. After reviewing the risks and                            benefits, the patient was deemed in satisfactory  condition to undergo the procedure.                           After obtaining informed consent, the colonoscope                            was passed under direct vision. Throughout the                            procedure, the patient's blood pressure, pulse, and                            oxygen saturations were monitored continuously. The                            CF [id number] #[id number] was  introduced through the anus                            and advanced to the the terminal ileum, with                            identification of the appendiceal orifice and IC                            valve. The colonoscopy was performed without                            difficulty. The patient tolerated the procedure                            well. The quality of the bowel preparation was                            good. The terminal ileum, the appendiceal orifice                            and the rectum were photographed. The bowel                            preparation used was SUPREP via split dose                            instruction. Scope In: 11:48:33 AM Scope Out: 12:01:49 PM Scope Withdrawal Time: 0 hours 10 minutes 28 seconds  Total Procedure Duration: 0 hours 13 minutes 16 seconds  Findings:                 The perianal and digital rectal examinations were                            normal. Pertinent negatives include normal                            sphincter tone and no palpable rectal lesions.  A 4 mm polyp was found in the transverse colon. The                            polyp was sessile. The polyp was removed with a                            cold snare. Resection and retrieval were complete.                            Estimated blood loss was minimal.                           A 5 mm polyp was found in the descending colon. The                            polyp was sessile. The polyp was removed with a                            cold snare. Resection and retrieval were complete.                            Estimated blood loss was minimal.                           The exam was otherwise normal throughout the                            examined colon.                           The terminal ileum appeared normal.                           The retroflexed view of the distal rectum and anal                            verge was normal and  showed no anal or rectal                            abnormalities. Complications:            No immediate complications. Estimated Blood Loss:     Estimated blood loss was minimal. Impression:               - One 4 mm polyp in the transverse colon, removed                            with a cold snare. Resected and retrieved.                           - One 5 mm polyp in the descending colon, removed                            with a cold snare. Resected and  retrieved.                           - The examined portion of the ileum was normal.                           - The distal rectum and anal verge are normal on                            retroflexion view. Recommendation:           - Patient has a contact number available for                            emergencies. The signs and symptoms of potential                            delayed complications were discussed with the                            patient. Return to normal activities tomorrow.                            Written discharge instructions were provided to the                            patient.                           - Resume previous diet.                           - Continue present medications.                           - Await pathology results.                           - Repeat colonoscopy in 5 years for surveillance. Clayborne Divis E. Stacia, MD 11/22/2024 12:10:17 PM This report has been signed electronically.

## 2024-11-22 NOTE — Progress Notes (Signed)
 Pt's states no medical or surgical changes since previsit or office visit.

## 2024-11-22 NOTE — Progress Notes (Signed)
 [facility] History and Physical   Primary Care Physician:  [name], NP   Reason for Procedure:   Colon cancer screening/history of polyps  Plan:    Surveillance colonoscopy   HPI: [name] is a 63 y.o. female undergoing surveillance colonoscopy.  She has no family history of colon cancer and no chronic GI symptoms.   Her last colonoscopy was in June 2020 by Dr. Leigh and 3 small polyps were removed, 2 of which were adenomatous.  In 2017 6 small polyps were removed, a mixture of adenomas and HPs  The patient was provided an opportunity to ask questions and all were answered. The patient agreed with the plan   Past Medical History:  Diagnosis Date   Allergy    Anxiety    Arthritis    Asthma    Cataract    Depression    bipolar disorder   Emphysema, unspecified (HCC)    Esophageal stricture    GERD (gastroesophageal reflux disease)    Heart defect    HLD (hyperlipidemia)    Hypothyroidism    IBS (irritable bowel syndrome)    IFG (impaired fasting glucose)    Migraine headache    Obesity    OSA (obstructive sleep apnea)    Pacemaker    Prediabetes    Reflux    Sleep apnea    SOB (shortness of breath)    Symptomatic bradycardia     Past Surgical History:  Procedure Laterality Date   ABDOMINAL HERNIA REPAIR     BREAST CYST EXCISION     left   CATARACT EXTRACTION Bilateral    CESAREAN SECTION  1983. 1987   Cesarean sections     x2   CHOLECYSTECTOMY     COLONOSCOPY     dental implants     foor surgery Right    screws    FOOT SURGERY     screws in left foot   HERNIA REPAIR  2005   PACEMAKER INSERTION     PPM GENERATOR CHANGEOUT N/A 10/07/2022   Procedure: PPM GENERATOR CHANGEOUT;  Surgeon: Waddell Danelle ORN, MD;  Location: [facility] INVASIVE CV [facility];  Service: Cardiovascular;  Laterality: N/A;   REPLACEMENT TOTAL KNEE Left    08-13-24   TUBAL LIGATION     VAGINAL HYSTERECTOMY      Prior to Admission medications   Medication Sig Start Date End Date Taking? Authorizing Provider  azelastine (ASTELIN) 0.1 % nasal spray Place 1 spray into both nostrils 2 (two) times daily. Use in each nostril as directed 11/20/24  Yes St Morton Sebastian, Nena, NP  azithromycin (ZITHROMAX) 250 MG tablet Take 2 tablets on day 1, then 1 tablet daily on days 2 through 5 11/20/24 11/25/24 Yes St Morton Sebastian, Nena, NP  escitalopram  (LEXAPRO ) 10 MG tablet Take 1 tablet by mouth once daily 09/17/24  Yes St Morton Sebastian, Nena, NP  levothyroxine  (SYNTHROID ) 100 MCG tablet Take 1 tablet (100 mcg total) by mouth daily. 11/19/24  Yes St Morton Sebastian, Nena, NP  omega-3 acid ethyl esters (LOVAZA) 1 g capsule Take by mouth.   Yes [provider]  omeprazole  (PRILOSEC) 20 MG capsule Take 1 capsule (20 mg total) by mouth 2 (two) times daily. 10/11/19  Yes Alphonsa Elsie RAMAN, MD  rosuvastatin  (CRESTOR ) 10 MG tablet TAKE 1 TABLET BY MOUTH ONCE DAILY FOR CHOLESTEROL 09/09/24  Yes St Morton Sebastian, [place], NP  Vitamin D , Ergocalciferol , (DRISDOL ) 1.25 MG (50000 UNIT) CAPS capsule Take 1  capsule (50,000 Units total) by mouth every 7 (seven) days. 11/10/21  Yes Opalski, Barnie, DO  albuterol  (VENTOLIN  HFA) 108 (90 Base) MCG/ACT inhaler INHALE 2 PUFFS BY MOUTH EVERY 4 HOURS AS NEEDED FOR WHEEZING OR SHORTNESS OF BREATH Patient taking differently: as needed. INHALE 2 PUFFS BY MOUTH EVERY 4 HOURS AS NEEDED FOR WHEEZING OR SHORTNESS OF BREATH 06/02/23   Meade Verdon RAMAN, MD  benzonatate  (TESSALON  PERLES) 100 MG capsule Take 1 capsule (100 mg total) by mouth 3 (three) times daily as needed. Patient not taking: Reported on 11/22/2024 04/12/24   Cathlene Marry Lenis, FNP  budeson-glycopyrrolate-formoterol (BREZTRI  AEROSPHERE) 160-9-4.8 MCG/ACT AERO inhaler Inhale 2 puffs into the lungs 2 (two) times daily. Patient taking differently: Inhale 2 puffs into the lungs as needed. 04/05/24   Milian, Marry Lenis, FNP  doxepin  (SINEQUAN ) 10 MG  capsule Take 1 capsule (10 mg total) by mouth at bedtime as needed. Patient not taking: Reported on 11/15/2024 10/17/24   [name], NP  ondansetron  (ZOFRAN -ODT) 4 MG disintegrating tablet Take 1 tablet (4 mg total) by mouth every 8 (eight) hours as needed for nausea or vomiting. Patient not taking: Reported on 11/22/2024 10/14/24   [name], NP    Current Outpatient Medications  Medication Sig Dispense Refill   azelastine (ASTELIN) 0.1 % nasal spray Place 1 spray into both nostrils 2 (two) times daily. Use in each nostril as directed 30 mL 5   azithromycin (ZITHROMAX) 250 MG tablet Take 2 tablets on day 1, then 1 tablet daily on days 2 through 5 6 tablet 0   escitalopram  (LEXAPRO ) 10 MG tablet Take 1 tablet by mouth once daily 90 tablet 0   levothyroxine  (SYNTHROID ) 100 MCG tablet Take 1 tablet (100 mcg total) by mouth daily. 90 tablet 0   omega-3 acid ethyl esters (LOVAZA) 1 g capsule Take by mouth.     omeprazole  (PRILOSEC) 20 MG capsule Take 1 capsule (20 mg total) by mouth 2 (two) times daily. 60 capsule 5   rosuvastatin  (CRESTOR ) 10 MG tablet TAKE 1 TABLET BY MOUTH ONCE DAILY FOR CHOLESTEROL 90 tablet 0   Vitamin D , Ergocalciferol , (DRISDOL ) 1.25 MG (50000 UNIT) CAPS capsule Take 1 capsule (50,000 Units total) by mouth every 7 (seven) days. 4 capsule 0   albuterol  (VENTOLIN  HFA) 108 (90 Base) MCG/ACT inhaler INHALE 2 PUFFS BY MOUTH EVERY 4 HOURS AS NEEDED FOR WHEEZING OR SHORTNESS OF BREATH (Patient taking differently: as needed. INHALE 2 PUFFS BY MOUTH EVERY 4 HOURS AS NEEDED FOR WHEEZING OR SHORTNESS OF BREATH) 18 g 5   benzonatate  (TESSALON  PERLES) 100 MG capsule Take 1 capsule (100 mg total) by mouth 3 (three) times daily as needed. (Patient not taking: Reported on 11/22/2024) 30 capsule 0   budeson-glycopyrrolate-formoterol (BREZTRI  AEROSPHERE) 160-9-4.8 MCG/ACT AERO inhaler Inhale 2 puffs into the lungs 2 (two) times daily. (Patient taking differently:  Inhale 2 puffs into the lungs as needed.) 10.7 g 11   doxepin  (SINEQUAN ) 10 MG capsule Take 1 capsule (10 mg total) by mouth at bedtime as needed. (Patient not taking: Reported on 11/15/2024) 30 capsule 0   ondansetron  (ZOFRAN -ODT) 4 MG disintegrating tablet Take 1 tablet (4 mg total) by mouth every 8 (eight) hours as needed for nausea or vomiting. (Patient not taking: Reported on 11/22/2024) 20 tablet 0   Current Facility-Administered Medications  Medication Dose Route Frequency Provider Last Rate Last Admin   0.9 %  sodium chloride  infusion  500 mL Intravenous Once [place],  Glendia BRAVO, MD        Allergies as of 11/22/2024 - Review Complete 11/22/2024  Allergen Reaction Noted   Bentyl  [dicyclomine ] Other (See Comments) 11/15/2024   Codeine Other (See Comments)    Misc. sulfonamide containing compounds Nausea And Vomiting and Other (See Comments) 06/18/2017   Betadine [povidone iodine] Other (See Comments) 05/22/2013   Paxil  [paroxetine  hcl] Other (See Comments) 12/16/2016   Sulfonamide derivatives Nausea And Vomiting    Vioxx [rofecoxib] Rash 05/22/2013    Family History  Problem Relation Age of Onset   Arthritis Mother    Heart disease Mother        coronary; female < 24   Asthma Mother    High Cholesterol Mother    Thyroid  disease Mother    Depression Mother    Bipolar disorder Mother    Throat cancer Father        squamous cell carcinoma   Tongue cancer Father    Esophageal cancer Father    Depression Father    Cancer Father    Cancer Brother    Depression Brother    Colon cancer Neg Hx    Rectal cancer Neg Hx    Stomach cancer Neg Hx    Colon polyps Neg Hx     Social History   Socioeconomic History   Marital status: Married    Spouse name: Lynwood   Number of children: 2   Years of education: Not on file   Highest education level: GED or equivalent  Occupational History   Occupation: retired    Associate Professor: [employer]   Occupation: Nana, Housewife  Tobacco Use    Smoking status: Former    Current packs/day: 0.00    Types: Cigarettes    Quit date: 07/08/2022    Years since quitting: 2.3   Smokeless tobacco: Never  Vaping Use   Vaping status: Former  Substance and Sexual Activity   Alcohol use: No    Alcohol/week: 0.0 standard drinks of alcohol    Comment: Occ.    Drug use: No   Sexual activity: Not on file  Other Topics Concern   Not on file  Social History Narrative   Unemployed. 10 cans of soda per day.    Social Drivers of Health   Tobacco Use: Medium Risk (11/22/2024)   Patient History    Smoking Tobacco Use: Former    Smokeless Tobacco Use: Never    Passive Exposure: Not on file  Financial Resource Strain: Low Risk (10/10/2024)   Overall Financial Resource Strain (CARDIA)    Difficulty of Paying Living Expenses: Not very hard  Food Insecurity: No Food Insecurity (10/10/2024)   Epic    Worried About Programme Researcher, Broadcasting/film/video in the Last Year: Never true    Ran Out of Food in the Last Year: Never true  Transportation Needs: No Transportation Needs (10/10/2024)   Epic    Lack of Transportation (Medical): No    Lack of Transportation (Non-Medical): No  Physical Activity: Insufficiently Active (10/10/2024)   Exercise Vital Sign    Days of Exercise per Week: 1 day    Minutes of Exercise per Session: 10 min  Stress: Stress Concern Present (10/10/2024)   [employer] of Occupational Health - Occupational Stress Questionnaire    Feeling of Stress: Rather much  Social Connections: Moderately Isolated (10/10/2024)   Social Connection and Isolation Panel    Frequency of Communication with Friends and Family: Three times a week    Frequency of  Social Gatherings with Friends and Family: Patient declined    Attends Religious Services: Patient declined    Database Administrator or Organizations: No    Attends Engineer, Structural: Not on file    Marital Status: Married  Catering Manager Violence: Not At Risk (08/13/2024)    Received from [facility]   HITS    Over the last 12 months how often did your partner physically hurt you?: Never    Over the last 12 months how often did your partner insult you or talk down to you?: Never    Over the last 12 months how often did your partner threaten you with physical harm?: Never    Over the last 12 months how often did your partner scream or curse at you?: Never  Depression (PHQ2-9): Medium Risk (10/17/2024)   Depression (PHQ2-9)    PHQ-2 Score: 6  Alcohol Screen: Low Risk (10/10/2024)   Alcohol Screen    Last Alcohol Screening Score (AUDIT): 1  Housing: Low Risk (10/10/2024)   Epic    Unable to Pay for Housing in the Last Year: No    Number of Times Moved in the Last Year: 0    Homeless in the Last Year: No  Utilities: Not At Risk (08/13/2024)   Received from [facility]    In the past 12 months has the electric, gas, oil, or water company threatened to shut off services in your home?: No  Health Literacy: Not on file    Review of Systems:  All other review of systems negative except as mentioned in the HPI.  Physical Exam: Vital signs BP (!) 170/86   Pulse (!) 58   Temp 97.9 F (36.6 C) (Skin)   Ht 5' 6 (1.676 m)   Wt 206 lb (93.4 kg)   SpO2 96%   BMI 33.25 kg/m   General:   Alert,  Well-developed, well-nourished, pleasant and cooperative in NAD Airway:  Mallampati 3 Lungs:  Clear throughout to auscultation.   Heart:  Regular rate and rhythm; no murmurs, clicks, rubs,  or gallops. Abdomen:  Soft, nontender and nondistended. Normal bowel sounds.   Neuro/Psych:  Normal mood and affect. A and O x 3   Darah Simkin E. Stacia, MD [facility]

## 2024-11-22 NOTE — Progress Notes (Signed)
 Vss nad trans to pacu

## 2024-11-22 NOTE — Patient Instructions (Signed)
 Continue present medications. Await pathology results. Repeat colonoscopy in 5 years for surveillance.  Please read over handouts provided   YOU HAD AN ENDOSCOPIC PROCEDURE TODAY AT THE [facility]:   Refer to the procedure report that was given to you for any specific questions about what was found during the examination.  If the procedure report does not answer your questions, please call your gastroenterologist to clarify.  If you requested that your care partner not be given the details of your procedure findings, then the procedure report has been included in a sealed envelope for you to review at your convenience later.  YOU SHOULD EXPECT: Some feelings of bloating in the abdomen. Passage of more gas than usual.  Walking can help get rid of the air that was put into your GI tract during the procedure and reduce the bloating. If you had a lower endoscopy (such as a colonoscopy or flexible sigmoidoscopy) you may notice spotting of blood in your stool or on the toilet paper. If you underwent a bowel prep for your procedure, you may not have a normal bowel movement for a few days.  Please Note:  You might notice some irritation and congestion in your nose or some drainage.  This is from the oxygen used during your procedure.  There is no need for concern and it should clear up in a day or so.  SYMPTOMS TO REPORT IMMEDIATELY:  Following lower endoscopy (colonoscopy or flexible sigmoidoscopy):  Excessive amounts of blood in the stool  Significant tenderness or worsening of abdominal pains  Swelling of the abdomen that is new, acute  Fever of 100F or higher  For urgent or emergent issues, a gastroenterologist can be reached at any hour by calling [phone]. Do not use MyChart messaging for urgent concerns.    DIET:  We do recommend a small meal at first, but then you may proceed to your regular diet.  Drink plenty of fluids but you should avoid alcoholic beverages for 24  hours.  ACTIVITY:  You should plan to take it easy for the rest of today and you should NOT DRIVE or use heavy machinery until tomorrow (because of the sedation medicines used during the test).    FOLLOW UP: Our staff will call the number listed on your records the next business day following your procedure.  We will call around 7:15- 8:00 am to check on you and address any questions or concerns that you may have regarding the information given to you following your procedure. If we do not reach you, we will leave a message.     If any biopsies were taken you will be contacted by phone or by letter within the next 1-3 weeks.  Please call us at [phone] if you have not heard about the biopsies in 3 weeks.    SIGNATURES/CONFIDENTIALITY: You and/or your care partner have signed paperwork which will be entered into your electronic medical record.  These signatures attest to the fact that that the information above on your After Visit Summary has been reviewed and is understood.  Full responsibility of the confidentiality of this discharge information lies with you and/or your care-partner.

## 2024-11-22 NOTE — Progress Notes (Signed)
 Called to room to assist during endoscopic procedure.  Patient ID and intended procedure confirmed with present staff. Received instructions for my participation in the procedure from the performing physician.

## 2024-11-25 ENCOUNTER — Telehealth: Payer: Self-pay

## 2024-11-25 NOTE — Telephone Encounter (Signed)
°  Follow up Call-     11/22/2024   10:38 AM  Call back number  Post procedure Call Back phone  # [phone]  Permission to leave phone message Yes     Patient questions:  Do you have a fever, pain , or abdominal swelling? No. Pain Score  0 *  Have you tolerated food without any problems? Yes.    Have you been able to return to your normal activities? Yes.    Do you have any questions about your discharge instructions: Diet   No. Medications  No. Follow up visit  No.  Do you have questions or concerns about your Care? No.  Actions: * If pain score is 4 or above: No action needed, pain <4.

## 2024-11-26 LAB — SURGICAL PATHOLOGY

## 2024-11-29 ENCOUNTER — Ambulatory Visit: Payer: Self-pay | Admitting: Gastroenterology

## 2024-11-29 NOTE — Progress Notes (Signed)
 [name],  The two polyps which I removed during your recent procedure were proven to be completely benign but are considered pre-cancerous polyps that MAY have grown into cancer if they had not been removed.  Studies shows that at least 20% of women over age 61 and 30% of men over age 42 have pre-cancerous polyps.  Based on your history of multiple polyps on your previous colonoscopy, I recommend that you have a repeat colonoscopy in 5 years.   If you develop any new rectal bleeding, abdominal pain or significant bowel habit changes, please contact me before then.

## 2024-12-02 ENCOUNTER — Ambulatory Visit: Admitting: Internal Medicine

## 2024-12-02 ENCOUNTER — Encounter: Payer: Self-pay | Admitting: Internal Medicine

## 2024-12-02 VITALS — BP 132/85 | HR 73 | Temp 97.7°F | Ht 65.0 in | Wt 209.0 lb

## 2024-12-02 DIAGNOSIS — F17210 Nicotine dependence, cigarettes, uncomplicated: Secondary | ICD-10-CM | POA: Diagnosis not present

## 2024-12-02 DIAGNOSIS — G4733 Obstructive sleep apnea (adult) (pediatric): Secondary | ICD-10-CM | POA: Diagnosis not present

## 2024-12-02 DIAGNOSIS — Z122 Encounter for screening for malignant neoplasm of respiratory organs: Secondary | ICD-10-CM

## 2024-12-02 DIAGNOSIS — R0602 Shortness of breath: Secondary | ICD-10-CM

## 2024-12-02 DIAGNOSIS — J449 Chronic obstructive pulmonary disease, unspecified: Secondary | ICD-10-CM

## 2024-12-02 DIAGNOSIS — F17200 Nicotine dependence, unspecified, uncomplicated: Secondary | ICD-10-CM

## 2024-12-02 NOTE — Patient Instructions (Addendum)
 It was a pleasure to see you today!  Continue the albuterol  inhaler as needed.   Please get your lung cancer screening scan done asap with your primary care. Your last scan was September 2024 so you are overdue.   I  know you can quit smoking! Good luck :)   What are the benefits of quitting smoking? Quitting smoking can lower your chances of getting or dying from heart disease, lung disease, kidney failure, infection, or cancer. It can also lower your chances of getting osteoporosis, a condition that makes your bones weak. Plus, quitting smoking can help your skin look younger and reduce the chances that you will have problems with sex.  Quitting smoking will improve your health no matter how old you are, and no matter how long or how much you have smoked.  What should I do if I want to quit smoking? The letters in the word START can help you remember the steps to take: S = Set a quit date. T = Tell family, friends, and the people around you that you plan to quit. A = Anticipate or plan ahead for the tough times you'll face while quitting. R = Remove cigarettes and other tobacco products from your home, car, and work. T = Talk to your doctor about getting help to quit.  How can my doctor or nurse help? Your doctor or nurse can give you advice on the best way to quit. He or she can also put you in touch with counselors or other people you can call for support. Plus, your doctor or nurse can give you medicines to: ?Reduce your craving for cigarettes ?Reduce the unpleasant symptoms that happen when you stop smoking (called withdrawal symptoms). You can also get help from a free phone line (1-800-QUIT-NOW) or go online to mechanicalarm.dk.  What are the symptoms of withdrawal? The symptoms include: ?Trouble sleeping ?Being irritable, anxious or restless ?Getting frustrated or angry ?Having trouble thinking clearly  Some people who stop smoking become temporarily depressed. Some  people need treatment for depression, such as counseling or antidepressant medicines. Depressed people might: ?No longer enjoy or care about doing the things they used to like to do ?Feel sad, down, hopeless, nervous, or cranky most of the day, almost every day ?Lose or gain weight ?Sleep too much or too little ?Feel tired or like they have no energy ?Feel guilty or like they are worth nothing ?Forget things or feel confused ?Move and speak more slowly than usual ?Act restless or have trouble staying still ?Think about death or suicide  If you think you might be depressed, see your doctor or nurse. Only someone trained in mental health can tell for sure if you are depressed. If you ever feel like you might hurt yourself, go straight to the nearest emergency department. Or you can call for an ambulance (in the [place]  and [place], dial 9-1-1) or call your doctor or nurse right away and tell them it is an emergency. You can also reach the [place]  National Suicide Prevention Lifeline at (517)397-4596 or http://hill.com/.  How do medicines help you stop smoking? Different medicines work in different ways: ?Nicotine replacement therapy eases withdrawal and reduces your body's craving for nicotine, the main drug found in cigarettes. There are different forms of nicotine replacement, including skin patches, lozenges, gum, nasal sprays, and puffers or inhalers. Many can be bought without a prescription, while others might require one. ?Bupropion is a prescription medicine that reduces your desire to smoke. This  medicine is sold under the brand names Zyban and Wellbutrin. It is also available in a generic version, which is cheaper than brand name medicines. ?Varenicline (brand names: Chantix, Champix) is a prescription medicine that reduces withdrawal symptoms and cigarette cravings. If you think you'd like to take varenicline and you have a history of depression, anxiety, or heart disease,  discuss this with your doctor or nurse before taking the medicine. Varenicline can also increase the effects of alcohol in some people. It's a good idea to limit drinking while you're taking it, at least until you know how it affects you.  How does counseling work? Counseling can happen during formal office visits or just over the phone. A counselor can help you: ?Figure out what triggers your smoking and what to do instead ?Overcome cravings ?Figure out what went wrong when you tried to quit before  What works best? Studies show that people have the best luck at quitting if they take medicines to help them quit and work with a veterinary surgeon. It might also be helpful to combine nicotine replacement with one of the prescription medicines that help people quit. In some cases, it might even make sense to take bupropion and varenicline together.  What about e-cigarettes? Sometimes people wonder if using electronic cigarettes, or e-cigarettes, might help them quit smoking. Using e-cigarettes is also called vaping. Doctors do not recommend e-cigarettes in place of medicines and counseling. That's because e-cigarettes still contain nicotine as well as other substances that might be harmful. It's not clear how they can affect a person's health in the long term.  Will I gain weight if I quit? Yes, you might gain a few pounds. But quitting smoking will have a much more positive effect on your health than weighing a few pounds more. Plus, you can help prevent some weight gain by being more active and eating less. Taking the medicine bupropion might help control weight gain.   What else can I do to improve my chances of quitting? You can: ?Start exercising. ?Stay away from smokers and places that you associate with smoking. If people close to you smoke, ask them to quit with you. ?Keep gum, hard candy, or something to put in your mouth handy. If you get a craving for a cigarette, try one of these  instead. ?Don't give up, even if you start smoking again. It takes most people a few tries before they succeed.  What if I am pregnant and I smoke? If you are pregnant, it's really important for the health of your baby that you quit. Ask your doctor what options you have, and what is safest for your baby

## 2024-12-02 NOTE — Progress Notes (Signed)
 "              [name]    [id number]    1961/05/25  Primary Care Physician:[facility] Pool, NP Date of Appointment: 12/02/2024 Established Patient Visit  Chief complaint:   Chief Complaint  Patient presents with   COPD    Patient states her breathing is good.     HPI: [name] is a 63 y.o. woman with history of tobacco use disorder quit July 2023  and OSA not on cpap.   Interval Updates: Here for annual follow up.   No issues with breathing had left TKA in September 2025.  Minimal albuterol  use. Can do most of her daily activities without dyspnea limitations.  Spends a lot of time with her grandchildren - 3 and they are at home with her.   No interval unscheduled hospital visits.   She started smoking again around the time of her knee replacement because she was anxious about surgery. Currently 3-4 cigarettes a day at most. In 2023 she had quit cold turkey. She thinks she can do it this way again.   She is overdue for lung cancer screening.   I have reviewed the patient's family social and past medical history and updated as appropriate.   Past Medical History:  Diagnosis Date   Allergy    Anxiety    Arthritis    Asthma    Cataract    Depression    bipolar disorder   Emphysema, unspecified (HCC)    Esophageal stricture    GERD (gastroesophageal reflux disease)    Heart defect    HLD (hyperlipidemia)    Hypothyroidism    IBS (irritable bowel syndrome)    IFG (impaired fasting glucose)    Migraine headache    Obesity    OSA (obstructive sleep apnea)    Pacemaker    Prediabetes    Reflux    Sleep apnea    SOB (shortness of breath)    Symptomatic bradycardia     Past Surgical History:  Procedure Laterality Date   ABDOMINAL HERNIA REPAIR     BREAST CYST EXCISION     left   CATARACT EXTRACTION Bilateral    CESAREAN SECTION  1983. 1987   Cesarean sections     x2   CHOLECYSTECTOMY     COLONOSCOPY     dental implants     foor  surgery Right    screws    FOOT SURGERY     screws in left foot   HERNIA REPAIR  2005   PACEMAKER INSERTION     PPM GENERATOR CHANGEOUT N/A 10/07/2022   Procedure: PPM GENERATOR CHANGEOUT;  Surgeon: Waddell Danelle ORN, MD;  Location: [facility];  Service: Cardiovascular;  Laterality: N/A;   REPLACEMENT TOTAL KNEE Left    08-13-24   TUBAL LIGATION     VAGINAL HYSTERECTOMY      Family History  Problem Relation Age of Onset   Arthritis Mother    Heart disease Mother        coronary; female < 101   Asthma Mother    High Cholesterol Mother    Thyroid  disease Mother    Depression Mother    Bipolar disorder Mother    Throat cancer Father        squamous cell carcinoma   Tongue cancer Father    Esophageal cancer Father    Depression Father    Cancer Father    Cancer Brother  Depression Brother    Colon cancer Neg Hx    Rectal cancer Neg Hx    Stomach cancer Neg Hx    Colon polyps Neg Hx     Social History   Occupational History   Occupation: retired    Associate Professor: [employer]   Occupation: Nana, Housewife  Tobacco Use   Smoking status: Former    Current packs/day: 0.00    Types: Cigarettes    Quit date: 07/08/2022    Years since quitting: 2.4   Smokeless tobacco: Never  Vaping Use   Vaping status: Former  Substance and Sexual Activity   Alcohol use: No    Alcohol/week: 0.0 standard drinks of alcohol    Comment: Occ.    Drug use: No   Sexual activity: Not on file     Physical Exam: Blood pressure 132/85, pulse 73, temperature 97.7 F (36.5 C), temperature source Oral, height 5' 5 (1.651 m), weight 209 lb (94.8 kg), SpO2 97%.  Gen:      No distress, well appearing Lungs:  ctab no wheezes or crackles CV:      RRR no mrg   Data Reviewed: Imaging: CT Chest Sept 2024 no lung nodules.   PFTs:     Latest Ref Rng & Units 08/17/2022    2:41 PM  PFT Results  FVC-Pre L 3.01   FVC-Predicted Pre % 87   FVC-Post L 3.16   FVC-Predicted Post % 91   Pre FEV1/FVC  % % 82   Post FEV1/FCV % % 79   FEV1-Pre L 2.46   FEV1-Predicted Pre % 91   FEV1-Post L 2.48   DLCO uncorrected ml/min/mmHg 25.19   DLCO UNC% % 119   DLCO corrected ml/min/mmHg 25.19   DLCO COR %Predicted % 119   DLVA Predicted % 116   TLC L 5.56   TLC % Predicted % 106   RV % Predicted % 121    I have personally reviewed the patient's PFTs and normal pft  Labs: Lab Results  Component Value Date   WBC 7.0 04/05/2024   HGB 14.7 04/05/2024   HCT 44.6 04/05/2024   MCV 90 04/05/2024   PLT 343 04/05/2024   Lab Results  Component Value Date   NA 142 04/05/2024   K 3.9 04/05/2024   CO2 24 04/05/2024   GLUCOSE 92 04/05/2024   BUN 8 04/05/2024   CREATININE 0.91 04/05/2024   CALCIUM  9.5 04/05/2024   EGFR 72 04/05/2024   GFRNONAA >60 10/07/2022    Immunization status: Immunization History  Administered Date(s) Administered   Hepatitis B, ADULT 12/27/2005, 01/30/2006   Influenza Inj Mdck Quad Pf 08/18/2021, 09/07/2022   Influenza Split 09/04/2019   Influenza, Quadrivalent, Recombinant, Inj, Pf 08/25/2019   Influenza, Seasonal, Injecte, Preservative Fre 11/03/2023   Influenza,inj,Quad PF,6+ Mos 12/04/2015, 10/17/2017, 09/26/2018   Influenza-Unspecified 09/12/2011, 09/11/2019, 11/20/2020   Moderna Sars-Covid-2 Vaccination 03/16/2020, 04/13/2020, 11/13/2020   PNEUMOCOCCAL CONJUGATE-20 09/07/2022   Tdap 11/03/2023   Zoster Recombinant(Shingrix) 08/25/2019, 09/11/2019, 12/10/2019    External Records Personally Reviewed: lung cancer screening  Assessment:  COPD, Gold stage 0, controlled on prn albuterol  Tobacco use disorder, resumed with everyday use.  OSA, not on cpap Need for lung cancer screening.   Plan/Recommendations: Continue the albuterol  inhaler as needed.   Please get your lung cancer screening scan done asap with your primary care. Your last scan was September 2024 so you are overdue.   I  know you can quit smoking! Good luck :)  Smoking Cessation  Counseling:  1. The patient is an everyday smoker and symptomatic due to the following condition COPD 2. The patient is currently contemplative in quitting smoking. 3. I advised patient to quit smoking. 4. We identified patient specific barriers to change.  5. I personally spent 3 minutes counseling the patient regarding tobacco use disorder. 6. We discussed management of stress and anxiety to help with smoking cessation, when applicable. 7. We discussed nicotine replacement therapy, Wellbutrin, Chantix as possible options. 8. I advised setting a quit date. 9. Follow?up arranged with our office to continue ongoing discussions. 10.Resources given to patient including quit hotline.   Return to Care: Return if symptoms worsen or fail to improve.   Verdon Gore, MD Pulmonary and Critical Care Medicine [facility] [phone]     "

## 2024-12-04 ENCOUNTER — Other Ambulatory Visit: Admitting: Gastroenterology

## 2024-12-06 ENCOUNTER — Other Ambulatory Visit: Payer: Self-pay | Admitting: Nurse Practitioner

## 2024-12-15 ENCOUNTER — Other Ambulatory Visit: Payer: Self-pay | Admitting: Nurse Practitioner

## 2024-12-15 DIAGNOSIS — F419 Anxiety disorder, unspecified: Secondary | ICD-10-CM

## 2024-12-16 ENCOUNTER — Ambulatory Visit

## 2024-12-16 ENCOUNTER — Telehealth: Payer: Self-pay

## 2024-12-16 NOTE — Telephone Encounter (Signed)
 Called to inform patient that provider is out sick today and we need to reschedule appointment, no answer and voicemail is full.

## 2024-12-17 ENCOUNTER — Encounter: Payer: Self-pay | Admitting: Nurse Practitioner

## 2024-12-17 ENCOUNTER — Other Ambulatory Visit

## 2024-12-17 VITALS — BP 151/83 | HR 66 | Temp 98.2°F | Ht 65.0 in | Wt 207.0 lb

## 2024-12-17 DIAGNOSIS — E559 Vitamin D deficiency, unspecified: Secondary | ICD-10-CM

## 2024-12-17 DIAGNOSIS — F331 Major depressive disorder, recurrent, moderate: Secondary | ICD-10-CM

## 2024-12-17 DIAGNOSIS — E6609 Other obesity due to excess calories: Secondary | ICD-10-CM | POA: Diagnosis not present

## 2024-12-17 DIAGNOSIS — E782 Mixed hyperlipidemia: Secondary | ICD-10-CM

## 2024-12-17 DIAGNOSIS — E66811 Obesity, class 1: Secondary | ICD-10-CM | POA: Diagnosis not present

## 2024-12-17 DIAGNOSIS — J069 Acute upper respiratory infection, unspecified: Secondary | ICD-10-CM | POA: Diagnosis not present

## 2024-12-17 DIAGNOSIS — Z6834 Body mass index (BMI) 34.0-34.9, adult: Secondary | ICD-10-CM

## 2024-12-17 MED ORDER — GUAIFENESIN 400 MG PO TABS: 400.0000 mg | ORAL_TABLET | Freq: Four times a day (QID) | ORAL | 0 refills | Status: AC | PRN

## 2024-12-17 MED ORDER — AMOXICILLIN 875 MG PO TABS: 875.0000 mg | ORAL_TABLET | Freq: Two times a day (BID) | ORAL | 0 refills | Status: AC

## 2024-12-17 MED ORDER — DOXEPIN HCL 25 MG PO CAPS: 25.0000 mg | ORAL_CAPSULE | Freq: Every evening | ORAL | 0 refills | Status: AC | PRN

## 2024-12-17 NOTE — Progress Notes (Signed)
 "    Subjective:  Patient ID: [name], female    DOB: [birth year], 64 y.o.   MRN: [id number]  Patient Care Team: Deitra Morton Hummer, Nena, NP as PCP - General (Nurse Practitioner) Waddell Danelle ORN, MD as PCP - Electrophysiology (Cardiology)   Chief Complaint:  Sinusitis (Has not cleared up since 11/20/24. Still having runny nose, coughing, sneezing, and chest congestion.  ) and Insomnia (Two trazadone and still waking up every two hours. Needs something else. )   HPI: [name] is a 64 y.o. female presenting on 12/17/2024 for Sinusitis (Has not cleared up since 11/20/24. Still having runny nose, coughing, sneezing, and chest congestion.  ) and Insomnia (Two trazadone and still waking up every two hours. Needs something else. )   Discussed the use of AI scribe software for clinical note transcription with the patient, who gave verbal consent to proceed.  History of Present Illness [name] is a 64 year old female who presents with persistent cough and sinusitis.  She has been experiencing a persistent cough and sinusitis since her last virtual visit on November 20, 2024. Initial treatment with a Z-Pak was ineffective, and she discontinued Astelin  due to discomfort. She plans to use over-the-counter Flonase . Symptoms include congestion, postnasal drip, and headache. The mucus was initially green but is now mostly clear. No fever reported.  She continues to take arthritis strength Tylenol  to manage pain from a knee replacement surgery in September. She is not using her Breo Ellipta inhaler, as she only experiences shortness of breath during physical exertion, such as walking across fields during her grandson's baseball games.  She has a history of insomnia and is currently taking doxepin  10 mg at night, which is ineffective. Previous trials with trazodone  were also unsuccessful. She experiences frequent awakenings throughout the night, leading to poor sleep quality.    She is  also taking omeprazole  (Prilosec) regularly.  Hx of hyperlipidemia currently on Crestor , denies and muscle pain and changes in urine color.  She has hx of MDD current on Lexapro  10 mg dally and denies any side effects from the medications. Reports more good days than bad.       12/17/2024    8:33 AM 10/17/2024    3:59 PM 07/10/2024    8:57 AM  PHQ9 SCORE ONLY  PHQ-9 Total Score 9 6 9       Data saved with a previous flowsheet row definition       12/17/2024    8:34 AM 10/17/2024    4:00 PM 07/10/2024    8:57 AM 04/05/2024    9:55 AM  GAD 7 : Generalized Anxiety Score  Nervous, Anxious, on Edge 2 1 3 3   Control/stop worrying 1 1 1 1   Worry too much - different things 1 1 2 3   Trouble relaxing 0 0 1 1  Restless 0 0 0 0  Easily annoyed or irritable 2 0 2 3  Afraid - awful might happen 0 0 1 0  Total GAD 7 Score 6 3 10 11   Anxiety Difficulty Somewhat difficult Somewhat difficult Somewhat difficult Somewhat difficult     Relevant past medical, surgical, family, and social history reviewed and updated as indicated.  Allergies and medications reviewed and updated. Data reviewed: Chart in Epic.   Past Medical History:  Diagnosis Date   Allergy    Anxiety    Arthritis    Asthma    Cataract    Depression    bipolar  disorder   Emphysema, unspecified (HCC)    Esophageal stricture    GERD (gastroesophageal reflux disease)    Heart defect    HLD (hyperlipidemia)    Hypothyroidism    IBS (irritable bowel syndrome)    IFG (impaired fasting glucose)    Migraine headache    Obesity    OSA (obstructive sleep apnea)    Pacemaker    Prediabetes    Reflux    Sleep apnea    SOB (shortness of breath)    Symptomatic bradycardia     Past Surgical History:  Procedure Laterality Date   ABDOMINAL HERNIA REPAIR     BREAST CYST EXCISION     left   CATARACT EXTRACTION Bilateral    CESAREAN SECTION  1983. 1987   Cesarean sections     x2   CHOLECYSTECTOMY     COLONOSCOPY     dental  implants     foor surgery Right    screws    FOOT SURGERY     screws in left foot   HERNIA REPAIR  2005   PACEMAKER INSERTION     PPM GENERATOR CHANGEOUT N/A 10/07/2022   Procedure: PPM GENERATOR CHANGEOUT;  Surgeon: Waddell Danelle ORN, MD;  Location: [facility];  Service: Cardiovascular;  Laterality: N/A;   REPLACEMENT TOTAL KNEE Left    08-13-24   TUBAL LIGATION     VAGINAL HYSTERECTOMY      Social History   Socioeconomic History   Marital status: Married    Spouse name: Lynwood   Number of children: 2   Years of education: Not on file   Highest education level: GED or equivalent  Occupational History   Occupation: retired    Associate Professor: [employer]   Occupation: Nana, Housewife  Tobacco Use   Smoking status: Former    Current packs/day: 0.00    Types: Cigarettes    Quit date: 07/08/2022    Years since quitting: 2.4   Smokeless tobacco: Never  Vaping Use   Vaping status: Former  Substance and Sexual Activity   Alcohol use: No    Alcohol/week: 0.0 standard drinks of alcohol    Comment: Occ.    Drug use: No   Sexual activity: Not on file  Other Topics Concern   Not on file  Social History Narrative   Unemployed. 10 cans of soda per day.    Social Drivers of Health   Tobacco Use: Medium Risk (12/17/2024)   Patient History    Smoking Tobacco Use: Former    Smokeless Tobacco Use: Never    Passive Exposure: Not on file  Financial Resource Strain: Low Risk (10/10/2024)   Overall Financial Resource Strain (CARDIA)    Difficulty of Paying Living Expenses: Not very hard  Food Insecurity: No Food Insecurity (10/10/2024)   Epic    Worried About Programme Researcher, Broadcasting/film/video in the Last Year: Never true    Ran Out of Food in the Last Year: Never true  Transportation Needs: No Transportation Needs (10/10/2024)   Epic    Lack of Transportation (Medical): No    Lack of Transportation (Non-Medical): No  Physical Activity: Insufficiently Active (10/10/2024)   Exercise Vital Sign     Days of Exercise per Week: 1 day    Minutes of Exercise per Session: 10 min  Stress: Stress Concern Present (10/10/2024)   [employer] of Occupational Health - Occupational Stress Questionnaire    Feeling of Stress: Rather much  Social Connections: Moderately Isolated (  10/10/2024)   Social Connection and Isolation Panel    Frequency of Communication with Friends and Family: Three times a week    Frequency of Social Gatherings with Friends and Family: Patient declined    Attends Religious Services: Patient declined    Active Member of Clubs or Organizations: No    Attends Engineer, Structural: Not on file    Marital Status: Married  Catering Manager Violence: Not At Risk (08/13/2024)   Received from [facility]   HITS    Over the last 12 months how often did your partner physically hurt you?: Never    Over the last 12 months how often did your partner insult you or talk down to you?: Never    Over the last 12 months how often did your partner threaten you with physical harm?: Never    Over the last 12 months how often did your partner scream or curse at you?: Never  Depression (PHQ2-9): Medium Risk (12/17/2024)   Depression (PHQ2-9)    PHQ-2 Score: 9  Alcohol Screen: Low Risk (10/10/2024)   Alcohol Screen    Last Alcohol Screening Score (AUDIT): 1  Housing: Low Risk (10/10/2024)   Epic    Unable to Pay for Housing in the Last Year: No    Number of Times Moved in the Last Year: 0    Homeless in the Last Year: No  Utilities: Not At Risk (08/13/2024)   Received from [facility]    In the past 12 months has the electric, gas, oil, or water company threatened to shut off services in your home?: No  Health Literacy: Not on file    Outpatient Encounter Medications as of 12/17/2024  Medication Sig   albuterol  (VENTOLIN  HFA) 108 (90 Base) MCG/ACT inhaler INHALE 2 PUFFS BY MOUTH EVERY 4 HOURS AS NEEDED FOR WHEEZING OR SHORTNESS OF BREATH   amoxicillin  (AMOXIL ) 875 MG  tablet Take 1 tablet (875 mg total) by mouth 2 (two) times daily for 10 days.   doxepin  (SINEQUAN ) 25 MG capsule Take 1 capsule (25 mg total) by mouth at bedtime as needed.   escitalopram  (LEXAPRO ) 10 MG tablet Take 1 tablet by mouth once daily   guaifenesin  (HUMIBID E) 400 MG TABS tablet Take 1 tablet (400 mg total) by mouth every 6 (six) hours as needed.   levothyroxine  (SYNTHROID ) 100 MCG tablet Take 1 tablet (100 mcg total) by mouth daily.   omega-3 acid ethyl esters (LOVAZA) 1 g capsule Take by mouth.   omeprazole  (PRILOSEC) 20 MG capsule Take 1 capsule (20 mg total) by mouth 2 (two) times daily.   rosuvastatin  (CRESTOR ) 10 MG tablet TAKE 1 TABLET BY MOUTH ONCE DAILY FOR CHOLESTEROL   Vitamin D , Ergocalciferol , (DRISDOL ) 1.25 MG (50000 UNIT) CAPS capsule Take 1 capsule (50,000 Units total) by mouth every 7 (seven) days.   [DISCONTINUED] azelastine  (ASTELIN ) 0.1 % nasal spray Place 1 spray into both nostrils 2 (two) times daily. Use in each nostril as directed (Patient not taking: Reported on 12/17/2024)   [DISCONTINUED] benzonatate  (TESSALON  PERLES) 100 MG capsule Take 1 capsule (100 mg total) by mouth 3 (three) times daily as needed. (Patient not taking: Reported on 12/02/2024)   [DISCONTINUED] budeson-glycopyrrolate-formoterol (BREZTRI  AEROSPHERE) 160-9-4.8 MCG/ACT AERO inhaler Inhale 2 puffs into the lungs 2 (two) times daily. (Patient not taking: Reported on 12/02/2024)   [DISCONTINUED] doxepin  (SINEQUAN ) 10 MG capsule Take 1 capsule (10 mg total) by mouth at bedtime as needed. (Patient not taking:  Reported on 12/02/2024)   [DISCONTINUED] ondansetron  (ZOFRAN -ODT) 4 MG disintegrating tablet Take 1 tablet (4 mg total) by mouth every 8 (eight) hours as needed for nausea or vomiting. (Patient not taking: Reported on 12/02/2024)   No facility-administered encounter medications on file as of 12/17/2024.    Allergies[1]  Pertinent ROS per HPI, otherwise unremarkable      Objective:  BP (!)  151/83   Pulse 66   Temp 98.2 F (36.8 C)   Ht 5' 5 (1.651 m)   Wt 207 lb (93.9 kg)   SpO2 99%   BMI 34.45 kg/m    Wt Readings from Last 3 Encounters:  12/17/24 207 lb (93.9 kg)  12/02/24 209 lb (94.8 kg)  11/22/24 206 lb (93.4 kg)    Physical Exam Vitals reviewed.  Constitutional:      Appearance: She is obese.  HENT:     Head: Normocephalic and atraumatic.     Right Ear: Tympanic membrane, ear canal and external ear normal. There is no impacted cerumen.     Left Ear: Tympanic membrane and external ear normal. There is no impacted cerumen.     Nose: Congestion present.     Right Sinus: Frontal sinus tenderness present.     Left Sinus: Frontal sinus tenderness present.     Mouth/Throat:     Mouth: Mucous membranes are moist.  Eyes:     General: No scleral icterus.    Extraocular Movements: Extraocular movements intact.     Conjunctiva/sclera: Conjunctivae normal.     Pupils: Pupils are equal, round, and reactive to light.  Cardiovascular:     Heart sounds: Normal heart sounds.  Pulmonary:     Effort: Pulmonary effort is normal.     Breath sounds: Normal breath sounds.  Musculoskeletal:        General: Normal range of motion.     Right lower leg: No edema.     Left lower leg: No edema.  Skin:    General: Skin is warm and dry.     Findings: No rash.  Neurological:     Mental Status: She is alert and oriented to person, place, and time.  Psychiatric:        Attention and Perception: Attention and perception normal.        Mood and Affect: Mood normal.        Speech: Speech normal.        Behavior: Behavior normal. Behavior is cooperative.        Thought Content: Thought content normal. Thought content does not include homicidal or suicidal ideation. Thought content does not include homicidal or suicidal plan.        Cognition and Memory: Cognition and memory normal.    Physical Exam CHEST: Lungs clear to auscultation bilaterally.     Results for orders  placed or performed in visit on 11/22/24  Surgical pathology ([facility])   Collection Time: 11/22/24 12:00 AM  Result Value Ref Range   SURGICAL PATHOLOGY      SURGICAL PATHOLOGY [facility] [address] [place], [place] [address] Telephone [phone] or [phone] Fax [phone]  REPORT OF SURGICAL PATHOLOGY   Accession #: [id number] Patient Name: [name], [name] Visit # : [id number]  MRN: [id number] Physician: Stacia Hamilton DOB/Age 30-Nov-1961 (Age: 41) Gender: F Collected Date: 11/22/2024 Received Date: 11/25/2024  FINAL DIAGNOSIS       1. Surgical [P], colon, descending and transverse, polyp (2) :  FRAGMENTS OF TUBULAR ADENOMA.      NEGATIVE FOR HIGH-GRADE DYSPLASIA.       DATE SIGNED OUT: 11/26/2024 ELECTRONIC SIGNATURE : Pepper Dutton Md, Pathologist, Electronic Signature  MICROSCOPIC DESCRIPTION  CASE COMMENTS STAINS USED IN DIAGNOSIS: H&E    CLINICAL HISTORY  SPECIMEN(S) OBTAINED 1. Surgical [P], Colon, Descending And Transverse, Polyp (2)  SPECIMEN COMMENTS: 1. Hx of colonic polyps; benign neoplasm of transverse colon; benign neoplasm of descend ing colon SPECIMEN CLINICAL INFORMATION: 1. R/O adenoma    Gross Description 1. Received in formalin are tan, soft tissue fragments that are submitted in toto. Number: 2, Size: 0.4 cm smallest to 0.5 cm largest, (1B) ( TA )        Report signed out from the following location(s) [place] [address] [place], [place] [address] CLIA #: [id number]  [facility] [address] [place], [place] [address] CLIA #: [id number]        Pertinent labs & imaging results that were available during my care of the patient were reviewed by me and considered in my medical decision making.  Assessment & Plan:  [name] was seen today for sinusitis and insomnia.  Diagnoses and all orders for this visit:  URI with cough  and congestion  Vitamin D  deficiency  Mixed hyperlipidemia  Moderate episode of recurrent major depressive disorder (HCC)  Other orders -     doxepin  (SINEQUAN ) 25 MG capsule; Take 1 capsule (25 mg total) by mouth at bedtime as needed. -     amoxicillin  (AMOXIL ) 875 MG tablet; Take 1 tablet (875 mg total) by mouth 2 (two) times daily for 10 days. -     guaifenesin  (HUMIBID E) 400 MG TABS tablet; Take 1 tablet (400 mg total) by mouth every 6 (six) hours as needed.     Assessment and Plan [name] is a 64 year old Caucasian female seen today for chronic disease management, no acute distress Assessment & Plan Acute upper respiratory infection with cough and congestion Persistent cough and congestion with clear sputum, headache, and sinus pressure. Azithromycin  and Astelin  not tolerated. No fever. Lungs clear. - Prescribed amoxicillin  1 tablet twice daily for 10 days. - Recommended over-the-counter Flonase  for nasal congestion. - Encouraged plenty of hydration.  Insomnia Chronic insomnia not well managed with doxepin  10 mg. Previous trazodone  trials ineffective. Advised against Ambien due to controlled substance status. - Increased doxepin  to 25 mg at bedtime. - Instructed to report if insomnia persists despite increased doxepin  dosage.  -Hyperlipidemia Continue Crestor  5 mg daily no refill needed GERD Continue omeprazole  20 mg twice a day no refill needed MDD Continue Lexapro  10 mg daily no refill needed  Hypothyroidism Continue Synthroid  100 mcg in the morning  Future Order TSH next follow-up appointment  Continue all other maintenance medications.  Follow up plan: Return in about 4 months (around 04/16/2025), or if symptoms worsen or fail to improve.   Continue healthy lifestyle choices, including diet (rich in fruits, vegetables, and lean proteins, and low in salt and simple carbohydrates) and exercise (at least 30 minutes of moderate physical activity  daily).  Educational handout given for   Clinical References  Dyslipidemia Dyslipidemia is an imbalance of waxy, fat-like substances (lipids) in the blood. The body needs lipids in small amounts. Dyslipidemia often involves a high level of cholesterol or triglycerides, which are types of lipids. Common forms of dyslipidemia include: High levels of LDL cholesterol. LDL is the type of cholesterol that causes fatty  deposits (plaques) to build up in the blood vessels that carry blood away from the heart (arteries). Low levels of HDL cholesterol. HDL cholesterol is the type of cholesterol that protects against heart disease. High levels of HDL remove the LDL buildup from arteries. High levels of triglycerides. Triglycerides are a fatty substance in the blood that is linked to a buildup of plaques in the arteries. What are the causes? There are two main types of dyslipidemia: primary and secondary. Primary dyslipidemia is caused by changes (mutations) in genes that are passed down through families (inherited). These mutations cause several types of dyslipidemia. Secondary dyslipidemia may be caused by various risk factors that can lead to the disease, such as lifestyle choices and certain medical conditions. What increases the risk? You are more likely to develop this condition if you are an older man or if you are a woman who has gone through menopause. Other risk factors include: Having a family history of dyslipidemia. Taking certain medicines, including birth control pills, steroids, some diuretics, and beta-blockers. Eating a diet high in saturated fat. Smoking cigarettes or excessive alcohol intake. Having certain medical conditions such as diabetes, polycystic ovary syndrome (PCOS), kidney disease, liver disease, or hypothyroidism. Not exercising regularly. Being overweight or obese with too much belly fat. What are the signs or symptoms? In most cases, dyslipidemia does not usually cause  any symptoms. In severe cases, very high lipid levels can cause: Fatty bumps under the skin (xanthomas). A white or gray ring around the black center (pupil) of the eye. Very high triglyceride levels can cause inflammation of the pancreas (pancreatitis). How is this diagnosed? Your health care provider may diagnose dyslipidemia based on a routine blood test (fasting blood test). Because most people do not have symptoms of the condition, this blood testing (lipid profile) is done on adults age 28 and older and is repeated every 4-6 years. This test checks: Total cholesterol. This measures the total amount of cholesterol in your blood, including LDL cholesterol, HDL cholesterol, and triglycerides. A healthy number is below 200 mg/dL (4.82 mmol/L). LDL cholesterol. The target number for LDL cholesterol is different for each person, depending on individual risk factors. A healthy number is usually below 100 mg/dL (7.40 mmol/L). Ask your health care provider what your LDL cholesterol should be. HDL cholesterol. An HDL level of 60 mg/dL (8.44 mmol/L) or higher is best because it helps to protect against heart disease. A number below 40 mg/dL (8.96 mmol/L) for men or below 50 mg/dL (8.70 mmol/L) for women increases the risk for heart disease. Triglycerides. A healthy triglyceride number is below 150 mg/dL (8.30 mmol/L). If your lipid profile is abnormal, your health care provider may do other blood tests. How is this treated? Treatment depends on the type of dyslipidemia that you have and your other risk factors for heart disease and stroke. Your health care provider will have a target range for your lipid levels based on this information. Treatment for dyslipidemia starts with lifestyle changes, such as diet and exercise. Your health care provider may recommend that you: Get regular exercise. Make changes to your diet. Quit smoking if you smoke. Limit your alcohol intake. If diet changes and exercise do  not help you reach your goals, your health care provider may also prescribe medicine to lower lipids. The most commonly prescribed type of medicine lowers your LDL cholesterol (statin drug). If you have a high triglyceride level, your provider may prescribe another type of drug (fibrate) or an omega-3  fish oil supplement, or both. Follow these instructions at home: Eating and drinking  Follow instructions from your health care provider or dietitian about eating or drinking restrictions. Eat a healthy diet as told by your health care provider. This can help you reach and maintain a healthy weight, lower your LDL cholesterol, and raise your HDL cholesterol. This may include: Limiting your calories, if you are overweight. Eating more fruits, vegetables, whole grains, fish, and lean meats. Limiting saturated fat, trans fat, and cholesterol. Do not drink alcohol if: Your health care provider tells you not to drink. You are pregnant, may be pregnant, or are planning to become pregnant. If you drink alcohol: Limit how much you have to: 0-1 drink a day for women. 0-2 drinks a day for men. Know how much alcohol is in your drink. In the [place] one drink equals one 12 oz bottle of beer (355 mL), one 5 oz glass of wine (148 mL), or one 1 oz glass of hard liquor (44 mL). Activity Get regular exercise. Start an exercise and strength training program as told by your health care provider. Ask your health care provider what activities are safe for you. Your health care provider may recommend: 30 minutes of aerobic activity 4-6 days a week. Brisk walking is an example of aerobic activity. Strength training 2 days a week. General instructions Do not use any products that contain nicotine or tobacco. These products include cigarettes, chewing tobacco, and vaping devices, such as e-cigarettes. If you need help quitting, ask your health care provider. Take over-the-counter and prescription medicines only as told by  your health care provider. This includes supplements. Keep all follow-up visits. This is important. Contact a health care provider if: You are having trouble sticking to your exercise or diet plan. You are struggling to quit smoking or to control your use of alcohol. Summary Dyslipidemia often involves a high level of cholesterol or triglycerides, which are types of lipids. Treatment depends on the type of dyslipidemia that you have and your other risk factors for heart disease and stroke. Treatment for dyslipidemia starts with lifestyle changes, such as diet and exercise. Your health care provider may prescribe medicine to lower lipids. This information is not intended to replace advice given to you by your health care provider. Make sure you discuss any questions you have with your health care provider. Document Revised: 07/01/2022 Document Reviewed: 02/01/2021 Elsevier Patient Education  2025 [employer]. Managing Depression, Adult Depression is a mental health condition that affects your thoughts, feelings, and actions. Being diagnosed with depression can bring you relief if you did not know why you have felt or behaved a certain way. It could also leave you feeling overwhelmed. Finding ways to manage your symptoms can help you feel more positive about your future. How to manage lifestyle changes Being depressed is difficult. Depression can increase the level of everyday stress. Stress can make depression symptoms worse. You may believe your symptoms cannot be managed or will never improve. However, there are many things you can try to help manage your symptoms. There is hope. Managing stress  Stress is your body's reaction to life changes and events, both good and bad. Stress can add to your feelings of depression. Learning to manage your stress can help lessen your feelings of depression. Try some of the following approaches to reducing your stress (stress reduction techniques): Listen  to music that you enjoy and that inspires you. Try using a meditation app or take a meditation  class. Develop a practice that helps you connect with your spiritual self. Walk in nature, pray, or go to a place of worship. Practice deep breathing. To do this, inhale slowly through your nose. Pause at the top of your inhale for a few seconds and then exhale slowly, letting yourself relax. Repeat this three or four times. Practice yoga to help relax and work your muscles. Choose a stress reduction technique that works for you. These techniques take time and practice to develop. Set aside 5-15 minutes a day to do them. Therapists can offer training in these techniques. Do these things to help manage stress: Keep a journal. Know your limits. Set healthy boundaries for yourself and others, such as saying no when you think something is too much. Pay attention to how you react to certain situations. You may not be able to control everything, but you can change your reaction. Add humor to your life by watching funny movies or shows. Make time for activities that you enjoy and that relax you. Spend less time using electronics, especially at night before bed. The light from screens can make your brain think it is time to get up rather than go to bed.   Medicines Medicines, such as antidepressants, are often a part of treatment for depression. Talk with your pharmacist or health care provider about all the medicines, supplements, and herbal products that you take, their possible side effects, and what medicines and other products are safe to take together. Make sure to report any side effects you may have to your health care provider. Relationships Your health care provider may suggest family therapy, couples therapy, or individual therapy as part of your treatment. How to recognize changes Everyone responds differently to treatment for depression. As you recover from depression, you may start to: Have  more interest in doing activities. Feel more hopeful. Have more energy. Eat a more regular amount of food. Have better mental focus. It is important to recognize if your depression is not getting better or is getting worse. The symptoms you had in the beginning may return, such as: Feeling tired. Eating too much or too little. Sleeping too much or too little. Feeling restless, agitated, or hopeless. Trouble focusing or making decisions. Having unexplained aches and pains. Feeling irritable, angry, or aggressive. If you or your family members notice these symptoms coming back, let your health care provider know right away. Follow these instructions at home: Activity Try to get some form of exercise each day, such as walking. Try yoga, mindfulness, or other stress reduction techniques. Participate in group activities if you are able. Lifestyle Get enough sleep. Cut down on or stop using caffeine, tobacco, alcohol, and any other harmful substances. Eat a healthy diet that includes plenty of vegetables, fruits, whole grains, low-fat dairy products, and lean protein. Limit foods that are high in solid fats, added sugar, or salt (sodium). General instructions Take over-the-counter and prescription medicines only as told by your health care provider. Keep all follow-up visits. It is important for your health care provider to check on your mood, behavior, and medicines. Your health care provider may need to make changes to your treatment. Where to find support Talking to others  Friends and family members can be sources of support and guidance. Talk to trusted friends or family members about your condition. Explain your symptoms and let them know that you are working with a health care provider to treat your depression. Tell friends and family how they can help.  Finances Find mental health providers that fit with your financial situation. Talk with your health care provider if you are worried  about access to food, housing, or medicine. Call your insurance company to learn about your co-pays and prescription plan. Where to find more information You can find support in your area from: Anxiety and Depression Association of America (ADAA): adaa.org Mental Health America: mentalhealthamerica.net [employer] on Mental Illness: nami.org Contact a health care provider if: You stop taking your antidepressant medicines, and you have any of these symptoms: Nausea. Headache. Light-headedness. Chills and body aches. Not being able to sleep (insomnia). You or your friends and family think your depression is getting worse. Get help right away if: You have thoughts of hurting yourself or others. Get help right away if you feel like you may hurt yourself or others, or have thoughts about taking your own life. Go to your nearest emergency room or: Call 911. Call the National Suicide Prevention Lifeline at 313-223-6523 or 988. This is open 24 hours a day. Text the Crisis Text Line at 954-583-6096. This information is not intended to replace advice given to you by your health care provider. Make sure you discuss any questions you have with your health care provider. Document Revised: 04/05/2022 Document Reviewed: 04/05/2022 Elsevier Patient Education  2024 Else[employer] Inc. Upper Respiratory Infection, Adult An upper respiratory infection (URI) affects the nose, throat, and upper airways that lead to the lungs. The most common type of URI is often called the common cold. URIs usually get better on their own, without medical treatment. What are the causes? A URI is caused by a germ (virus). You may catch these germs by: Breathing in droplets from an infected person's cough or sneeze. Touching something that has the germ on it (is contaminated) and then touching your mouth, nose, or eyes. What increases the risk? You are more likely to get a URI if: You are very young or very old. You have  close contact with others, such as at work, school, or a health care facility. You smoke. You have long-term (chronic) heart or lung disease. You have a weakened disease-fighting system (immune system). You have nasal allergies or asthma. You have a lot of stress. You have poor nutrition. What are the signs or symptoms? Runny or stuffy (congested) nose. Cough. Sneezing. Sore throat. Headache. Feeling tired (fatigue). Fever. Not wanting to eat as much as usual. Pain in your forehead, behind your eyes, and over your cheekbones (sinus pain). Muscle aches. Redness or irritation of the eyes. Pressure in the ears or face. How is this treated? URIs usually get better on their own within 7-10 days. Medicines cannot cure URIs, but your doctor may recommend certain medicines to help relieve symptoms, such as: Over-the-counter cold medicines. Medicines to reduce coughing (cough suppressants). Coughing is a type of defense against infection that helps to clear the nose, throat, windpipe, and lungs (respiratory system). Take these medicines only as told by your doctor. Medicines to lower your fever. Follow these instructions at home: Activity Rest as needed. If you have a fever, stay home from work or school until your fever is gone, or until your doctor says you may return to work or school. You should stay home until you cannot spread the infection anymore (you are not contagious). Your doctor may have you wear a face mask so you have less risk of spreading the infection. Relieving symptoms Rinse your mouth often with salt water. To make salt water, dissolve -1  tsp (3-6 g) of salt in 1 cup (237 mL) of warm water. Use a cool-mist humidifier to add moisture to the air. This can help you breathe more easily. Eating and drinking  Drink enough fluid to keep your pee (urine) pale yellow. Eat soups and other clear broths. General instructions  Take over-the-counter and prescription medicines  only as told by your doctor. Do not smoke or use any products that contain nicotine or tobacco. If you need help quitting, ask your doctor. Avoid being where people are smoking (avoid secondhand smoke). Stay up to date on all your shots (immunizations), and get the flu shot every year. Keep all follow-up visits. How to prevent the spread of infection to others  Wash your hands with soap and water for at least 20 seconds. If you cannot use soap and water, use hand sanitizer. Avoid touching your mouth, face, eyes, or nose. Cough or sneeze into a tissue or your sleeve or elbow. Do not cough or sneeze into your hand or into the air. Contact a doctor if: You are getting worse, not better. You have any of these: A fever or chills. Brown or red mucus in your nose. Yellow or brown fluid (discharge)coming from your nose. Pain in your face, especially when you bend forward. Swollen neck glands. Pain when you swallow. White areas in the back of your throat. Get help right away if: You have shortness of breath that gets worse. You have very bad or constant: Headache. Ear pain. Pain in your forehead, behind your eyes, and over your cheekbones (sinus pain). Chest pain. You have long-lasting (chronic) lung disease along with any of these: Making high-pitched whistling sounds when you breathe, most often when you breathe out (wheezing). Long-lasting cough (more than 14 days). Coughing up blood. A change in your usual mucus. You have a stiff neck. You have changes in your: Vision. Hearing. Thinking. Mood. These symptoms may be an emergency. Get help right away. Call 911. Do not wait to see if the symptoms will go away. Do not drive yourself to the hospital. Summary An upper respiratory infection (URI) is caused by a germ (virus). The most common type of URI is often called the common cold. URIs usually get better within 7-10 days. Take over-the-counter and prescription medicines only as  told by your doctor. This information is not intended to replace advice given to you by your health care provider. Make sure you discuss any questions you have with your health care provider. Document Revised: 06/30/2021 Document Reviewed: 06/30/2021 Elsevier Patient Education  2024 Else[employer] Inc. Preventing Vitamin D  Deficiency Vitamin D  deficiency is when your body doesn't have enough vitamin D . Vitamin D  is important because it helps your body maintain calcium  and phosphorus levels. Vitamin D  is also important because: It helps keep bones and teeth healthy. It reduces irritation and swelling in the body (inflammation). It makes the body's defense system (immune system) strong. Our bodies make vitamin D  when our skin is exposed to direct sunlight. But for many people, this may not be enough vitamin D  to meet the body's needs. How can vitamin D  deficiency affect me? If lack of vitamin D  is really bad: Your bones may become soft. In adults, this is called osteomalacia. In children, this is called rickets. Your bones may become weak or thin. This is called osteoporosis. What can increase my risk for vitamin D  deficiency? You may be at risk for vitamin D  deficiency if: You're pregnant. You have too much body  fat (obesity). You're an older adult. You have dark skin. You take medicines that affect the way vitamin D  is absorbed. You had surgery to remove a part of the stomach or a part of the small intestine. You may also be at risk if: You have a condition that makes it hard for you to absorb fat. This includes Crohn's disease, long-term (chronic) pancreatitis, or cystic fibrosis. You have certain conditions that are passed from parent to child (inherited). You don't have access to foods rich in vitamin D , or you follow a diet with little or no dairy, such as a vegan diet or lactose-free diet. You're not able to move or go outside safely. You live in areas that have fewer hours of  sunlight. You spend most of your day indoors, or you cover your skin all the time when you're outdoors. What actions can I take to reduce my risk of vitamin D  deficiency? Know how much vitamin D  you need Different people need different amounts of vitamin D  daily: Infants: 400 international units (IU). Children older than 1 year: 600 IU. Adults: 600 IU. Pregnant and breastfeeding women: 600 IU. Adults older than 70 years: 800 IU. These are the minimum levels of recommended amounts. Your health care provider may tell you to take a different amount of vitamin D  based on your needs and your health. Know the best sources of vitamin D  You can meet your daily vitamin D  needs from: Direct exposure to natural sunlight. Foods. Dietary supplements. Infants can get their vitamin D  from infant formula. Get sun exposure Get regular, safe exposure to natural sunlight. Expose your skin to direct sunlight for at least 15 minutes every day. If you have dark skin, you may need to expose your skin for a longer period of time. Protect your skin from too much sun exposure. This helps to prevent skin cancer. Ask your provider if regular sun exposure is safe for you. Do not use a tanning bed. Follow the right diet  Eat foods that naturally contain vitamin D . These include: Beef liver. Eggs. Vitamin D  is in the yolk. Fish, such as salmon or trout. Mushrooms that were treated with UV light. Eat or drink products that have vitamin D  added to them (fortified). These may include: Cereals. Milk, including plant-based milk such as almond, soy, or oat milks. Orange juice. Margarine. When choosing foods, check the food label on the package to see: How much vitamin D  is in the item. If the food is fortified with vitamin D . The items listed above may not be all the foods and drinks that have vitamin D . Talk with an expert in healthy eating (dietitian) to learn more. Take supplements and medicines If you're at  risk for vitamin D  deficiency, or if you have certain diseases, your provider may recommend that you take a vitamin D  supplement. Make sure you: Talk with your provider before you start taking any vitamin D  supplements. You may have side effects of vitamin D  supplements if you're on certain medicines or have certain medical conditions. Take your medicines and supplments only as told. Tell your provider about all the medicines you're taking. These include vitamins, herbs, eye drops, creams, and over-the-counter medicines. To help your body to absorb vitamin D , take your supplement with a meal or snack. This information is not intended to replace advice given to you by your health care provider. Make sure you discuss any questions you have with your health care provider. Document Revised: 03/14/2024 Document Reviewed:  03/14/2024 Elsevier Patient Education  [employer].  The above assessment and management plan was discussed with the patient. The patient verbalized understanding of and has agreed to the management plan. Patient is aware to call the clinic if they develop any new symptoms or if symptoms persist or worsen. Patient is aware when to return to the clinic for a follow-up visit. Patient educated on when it is appropriate to go to the emergency department.   Nena Cassis Morton Hummer, DNP [facility] [address] [place], [place] [address] [phone]       [1]  Allergies Allergen Reactions   Bentyl  [Dicyclomine ] Other (See Comments)    Felt extra heavy hard to move   Codeine Other (See Comments)    Migraines   Misc. Sulfonamide Containing Compounds Nausea And Vomiting and Other (See Comments)   Betadine [Povidone Iodine] Other (See Comments)    Blisters    Paxil  [Paroxetine  Hcl] Other (See Comments)    Extreme sweating   Sulfonamide Derivatives Nausea And Vomiting   Vioxx [Rofecoxib] Rash   "

## 2024-12-21 ENCOUNTER — Ambulatory Visit (HOSPITAL_COMMUNITY)

## 2025-01-06 ENCOUNTER — Ambulatory Visit (INDEPENDENT_AMBULATORY_CARE_PROVIDER_SITE_OTHER)

## 2025-01-06 DIAGNOSIS — I442 Atrioventricular block, complete: Secondary | ICD-10-CM | POA: Diagnosis not present

## 2025-01-07 LAB — CUP PACEART REMOTE DEVICE CHECK
Battery Remaining Longevity: 111 mo
Battery Remaining Percentage: 85 %
Battery Voltage: 3.01 V
Brady Statistic AP VP Percent: 1 %
Brady Statistic AP VS Percent: 3.2 %
Brady Statistic AS VP Percent: 1 %
Brady Statistic AS VS Percent: 97 %
Brady Statistic RA Percent Paced: 2.9 %
Brady Statistic RV Percent Paced: 1 %
Date Time Interrogation Session: 20260126034258
Implantable Lead Connection Status: 753985
Implantable Lead Connection Status: 753985
Implantable Lead Implant Date: 20111130
Implantable Lead Implant Date: 20111130
Implantable Lead Location: 753859
Implantable Lead Location: 753860
Implantable Pulse Generator Implant Date: 20231027
Lead Channel Impedance Value: 350 Ohm
Lead Channel Impedance Value: 380 Ohm
Lead Channel Pacing Threshold Amplitude: 0.75 V
Lead Channel Pacing Threshold Amplitude: 1.625 V
Lead Channel Pacing Threshold Pulse Width: 0.4 ms
Lead Channel Pacing Threshold Pulse Width: 0.4 ms
Lead Channel Sensing Intrinsic Amplitude: 2.2 mV
Lead Channel Sensing Intrinsic Amplitude: 4.9 mV
Lead Channel Setting Pacing Amplitude: 1.875
Lead Channel Setting Pacing Amplitude: 2 V
Lead Channel Setting Pacing Pulse Width: 0.4 ms
Lead Channel Setting Sensing Sensitivity: 2 mV
Pulse Gen Model: 2272
Pulse Gen Serial Number: 8125938

## 2025-01-10 NOTE — Progress Notes (Signed)
 Remote PPM Transmission

## 2025-01-12 ENCOUNTER — Ambulatory Visit: Payer: Self-pay | Admitting: Student in an Organized Health Care Education/Training Program

## 2025-04-07 ENCOUNTER — Ambulatory Visit

## 2025-07-07 ENCOUNTER — Ambulatory Visit
